# Patient Record
Sex: Female | Born: 1940 | Race: White | Hispanic: No | Marital: Married | Smoking: Former smoker
Health system: Southern US, Community
[De-identification: ages and names within clinical notes are randomized; demographics above are authoritative.]

## PROBLEM LIST (undated history)

## (undated) DIAGNOSIS — M069 Rheumatoid arthritis, unspecified: Secondary | ICD-10-CM

## (undated) DIAGNOSIS — E039 Hypothyroidism, unspecified: Secondary | ICD-10-CM

## (undated) DIAGNOSIS — I480 Paroxysmal atrial fibrillation: Secondary | ICD-10-CM

## (undated) DIAGNOSIS — H3530 Unspecified macular degeneration: Secondary | ICD-10-CM

## (undated) DIAGNOSIS — M810 Age-related osteoporosis without current pathological fracture: Secondary | ICD-10-CM

## (undated) DIAGNOSIS — M21619 Bunion of unspecified foot: Secondary | ICD-10-CM

## (undated) DIAGNOSIS — T7840XA Allergy, unspecified, initial encounter: Secondary | ICD-10-CM

## (undated) DIAGNOSIS — R7303 Prediabetes: Secondary | ICD-10-CM

## (undated) DIAGNOSIS — I10 Essential (primary) hypertension: Secondary | ICD-10-CM

## (undated) DIAGNOSIS — J329 Chronic sinusitis, unspecified: Secondary | ICD-10-CM

## (undated) DIAGNOSIS — Z79899 Other long term (current) drug therapy: Secondary | ICD-10-CM

## (undated) DIAGNOSIS — J449 Chronic obstructive pulmonary disease, unspecified: Secondary | ICD-10-CM

## (undated) DIAGNOSIS — M1990 Unspecified osteoarthritis, unspecified site: Secondary | ICD-10-CM

## (undated) DIAGNOSIS — G4700 Insomnia, unspecified: Secondary | ICD-10-CM

## (undated) DIAGNOSIS — E785 Hyperlipidemia, unspecified: Secondary | ICD-10-CM

## (undated) HISTORY — DX: Unspecified osteoarthritis, unspecified site: M19.90

## (undated) HISTORY — DX: Chronic sinusitis, unspecified: J32.9

## (undated) HISTORY — DX: Rheumatoid arthritis, unspecified: M06.9

## (undated) HISTORY — PX: APPENDECTOMY: SHX54

## (undated) HISTORY — DX: Unspecified macular degeneration: H35.30

## (undated) HISTORY — DX: Hypothyroidism, unspecified: E03.9

## (undated) HISTORY — DX: Paroxysmal atrial fibrillation: I48.0

## (undated) HISTORY — DX: Other long term (current) drug therapy: Z79.899

## (undated) HISTORY — DX: Bunion of unspecified foot: M21.619

## (undated) HISTORY — PX: LEG SURGERY: SHX1003

## (undated) HISTORY — DX: Essential (primary) hypertension: I10

## (undated) HISTORY — DX: Allergy, unspecified, initial encounter: T78.40XA

## (undated) HISTORY — DX: Insomnia, unspecified: G47.00

## (undated) HISTORY — DX: Age-related osteoporosis without current pathological fracture: M81.0

## (undated) HISTORY — DX: Hyperlipidemia, unspecified: E78.5

## (undated) HISTORY — DX: Prediabetes: R73.03

---

## 2016-12-17 LAB — HM MAMMOGRAPHY

## 2016-12-17 LAB — HM DEXA SCAN: HM DEXA SCAN: ABNORMAL

## 2017-07-19 ENCOUNTER — Encounter: Payer: Self-pay | Admitting: Nurse Practitioner

## 2017-07-19 ENCOUNTER — Ambulatory Visit (INDEPENDENT_AMBULATORY_CARE_PROVIDER_SITE_OTHER): Payer: Medicare Other | Admitting: Nurse Practitioner

## 2017-07-19 VITALS — BP 137/79 | HR 80 | Temp 98.1°F | Ht 61.5 in | Wt 102.0 lb

## 2017-07-19 DIAGNOSIS — M810 Age-related osteoporosis without current pathological fracture: Secondary | ICD-10-CM | POA: Diagnosis not present

## 2017-07-19 DIAGNOSIS — G4700 Insomnia, unspecified: Secondary | ICD-10-CM | POA: Diagnosis not present

## 2017-07-19 DIAGNOSIS — M792 Neuralgia and neuritis, unspecified: Secondary | ICD-10-CM | POA: Diagnosis not present

## 2017-07-19 DIAGNOSIS — E039 Hypothyroidism, unspecified: Secondary | ICD-10-CM | POA: Diagnosis not present

## 2017-07-19 DIAGNOSIS — M0579 Rheumatoid arthritis with rheumatoid factor of multiple sites without organ or systems involvement: Secondary | ICD-10-CM | POA: Diagnosis not present

## 2017-07-19 DIAGNOSIS — I480 Paroxysmal atrial fibrillation: Secondary | ICD-10-CM

## 2017-07-19 DIAGNOSIS — I10 Essential (primary) hypertension: Secondary | ICD-10-CM | POA: Diagnosis not present

## 2017-07-19 DIAGNOSIS — G8929 Other chronic pain: Secondary | ICD-10-CM

## 2017-07-19 DIAGNOSIS — Z7689 Persons encountering health services in other specified circumstances: Secondary | ICD-10-CM

## 2017-07-19 MED ORDER — HYDROCODONE-ACETAMINOPHEN 2.5-325 MG PO TABS: ORAL_TABLET | ORAL | 0 refills | Status: DC

## 2017-07-19 MED ORDER — LEVOTHYROXINE SODIUM 25 MCG PO TABS: 25.0000 ug | ORAL_TABLET | Freq: Every day | ORAL | 0 refills | Status: DC

## 2017-07-19 MED ORDER — HYDROCODONE-ACETAMINOPHEN 5-325 MG PO TABS: ORAL_TABLET | ORAL | 0 refills | Status: DC

## 2017-07-19 MED ORDER — GABAPENTIN 100 MG PO CAPS: ORAL_CAPSULE | ORAL | 1 refills | Status: DC

## 2017-07-19 NOTE — Patient Instructions (Addendum)
Mrs. Losurdo, Thank you for coming in to clinic today.  1. For your thyroid: - take levothyroxine 25 mcg once daily  2. For your Rheumatoid arthritis: - I will make a referral for when you need to see them again for your medications.  Call clinic to let us know.  3. For your shooting pain (neuropathic pain): - Take gabapentin 100 mg one tablet twice daily as needed. Continue taking 300 mg or 3 tablets at bedtime.  4. For your hydrocodone: - Finish your current bottle w/ one tablet twice daily. - Your new pills will be hydrocodone - 2.5mg  325 acetaminophen. - Take 2 tablets twice daily for 7 days.  Then, take 1 tablet twice daily for 7 days,  Then take 1 tablet once daily for 7 days and STOP.  Please schedule a follow-up appointment with Cassell Smiles, AGNP. Return in about 2 months (around 09/18/2017) for chronic disease review with labs 2-3 days prior.  If you have any other questions or concerns, please feel free to call the clinic or send a message through [place]. You may also schedule an earlier appointment if necessary.  You will receive a survey after today's visit either digitally by e-mail or paper by [employer]. Your experiences and feedback matter to [place].  Please respond so we know how we are doing as we provide care for you.   Cassell Smiles, DNP, AGNP-BC Adult Gerontology Nurse Practitioner [place]

## 2017-07-19 NOTE — Progress Notes (Signed)
Subjective:    Patient ID: Alexis Owens, female    DOB: 1941/07/22, 76 y.o.   MRN: [id number]  Alexis Owens is a 76 y.o. female presenting on 07/19/2017 for [place] Provider Pt last seen by PCP 2 months ago.  Obtain records from Dr. Liam Rogers, [facility], [place], [place].   Rheumatologist prior was Dr. Tori Milks She has no acute concerns today, but wants to reestablish care after her move.  Recently moved from [facility] to [facility] w/ her husband to be closer to children/grandchildren.  Otherwise has no acute concerns today.  Feels stable on medications.  Still lives very active lifestyle.  Problem List Review: Rheumatoid Arthritis: next appointment w/ Rheumatology is due next in January 2019.   She is concerned she may not have refills until then, so encouraged pt to call prior rheumatology or have prescriptions transferred from her pharmacy.   Osteoporosis: managed by Rheumatology.  Currently on alendronate.  Has had prior compound fx of R tibia.  None in last year.  No falls in last 1 year.   Hypothyroidism: currently taking 25 mcg levothyroxine.  Consider not replacing.   Paroxysmal atrial fibrillation/Hypertension: currently taking lisinopril 20 mg once daily, diltiazem 24 hr tab 240 mg once daily.  Pt notes good bp and HR control.  Checks at home and usually less than 130/80. Currently asymptomatic.  No recent palpitations or heart racing.   Chronic pain: has been regularly on hydrocodone acetaminophen 7.5-325 mg tablet tid.  Has self weaned to bid.  Knows she will not get chronic opioids from this clinic and would like to try to stop them completely.  Pt has been on opioids for > 2 years.  - She also takes gabapentin 300 mg at bedtime. This provides relief w/ nightly dosing, but still has difficulty w/ pain during day.  Would consider increase of gabapentin for pain control prior to going to pain clinic.   GERD:  omeprazole 20 mg once daily.  NO active symptoms.  Taking as preventative w/ alendronate.   Seasonal allergies: managed w/ fonase, nasalcrom.  Takes OTC antihistamine as needed.  Currently asymptomatic.   Insomnia: trazodone 50 mg at hs for assistance w/ onset of sleep.   Past Medical History:  Diagnosis Date  . Allergy   . Arthritis   . Bunion   . Chronic sinusitis   . High risk medication use   . Hyperlipidemia   . Hypertension   . Hypothyroidism   . Insomnia   . Macular degeneration   . Osteoporosis   . Paroxysmal A-fib ([place])   . Prediabetes   . Rheumatoid arthritis [facility])    Past Surgical History:  Procedure Laterality Date  . APPENDECTOMY    . LEG SURGERY     Rt leg surgery for Compound fracture of the Right tibia    Social History   Social History  . Marital status: Married    Spouse name: N/A  . Number of children: N/A  . Years of education: N/A   Occupational History  . Not on file.   Social History Main Topics  . Smoking status: Former Smoker    Quit date: 07/19/2014  . Smokeless tobacco: Never Used  . Alcohol use Yes  . Drug use: No  . Sexual activity: Not on file   Other Topics Concern  . Not on file   Social History Narrative  . No narrative on file   Family  History  Problem Relation Age of Onset  . Breast cancer Sister   . Heart disease Brother   . Diabetes Brother    No current outpatient prescriptions on file prior to visit.   No current facility-administered medications on file prior to visit.     Review of Systems  Constitutional: Negative.   HENT: Negative.   Eyes: Negative.   Respiratory: Negative.   Cardiovascular: Negative.   Gastrointestinal: Negative.   Endocrine: Negative.   Genitourinary: Negative.   Musculoskeletal: Positive for arthralgias, back pain and joint swelling.  Skin: Negative.   Allergic/Immunologic: Negative.   Neurological: Negative.   Hematological: Negative.   Psychiatric/Behavioral: Negative.     Per HPI unless specifically indicated above      Objective:    BP 137/79 (BP Location: Right Arm, Patient Position: Sitting, Cuff Size: Small)   Pulse 80   Temp 98.1 F (36.7 C) (Oral)   Ht 5' 1.5" (1.562 m)   Wt 102 lb (46.3 kg)   BMI 18.96 kg/m   Wt Readings from Last 3 Encounters:  07/19/17 102 lb (46.3 kg)    Physical Exam  General - thin, well-appearing, NAD HEENT - Normocephalic, atraumatic Neck - supple, non-tender, no LAD, no thyromegaly, no carotid bruit Heart - RRR, no murmurs heard Lungs - Clear throughout all lobes, no wheezing, crackles, or rhonchi. Normal work of breathing. Extremeties - non-tender, no edema, cap refill < 2 seconds, peripheral pulses intact +2 bilaterally Skin - warm, dry Neuro - awake, alert, oriented x3, normal gait Psych - Normal mood and affect, normal behavior    Results for orders placed or performed in visit on 07/19/17  HM MAMMOGRAPHY  Result Value Ref Range   HM Mammogram 0-4 Bi-Rad 0-4 Bi-Rad, Self Reported Normal  HM DEXA SCAN  Result Value Ref Range   HM Dexa Scan abnormal       Assessment & Plan:   Problem List Items Addressed This Visit      Cardiovascular and Mediastinum   Hypertension    Currently well controlled on medications.  Pt w/o current symptoms.  Checks BP at home and is usually at goal of 130/80.  Plan: 1. Continue lisinopril and diltiazem. 2. Follow up 2 months.      Relevant Medications   diltiazem (TIAZAC) 240 MG 24 hr capsule   lisinopril (PRINIVIL,ZESTRIL) 20 MG tablet   Paroxysmal A-fib ([facility])    Controlled.  Regular rate.  HR less than 100.  Pt w/o symptoms and on stable dose for several years.  Plan: 1. Continue diltiazem.   2. Consult Cardiology if any changes. 3. Follow up 2 months.      Relevant Medications   diltiazem (TIAZAC) 240 MG 24 hr capsule   lisinopril (PRINIVIL,ZESTRIL) 20 MG tablet     Endocrine   Hypothyroidism    Currently well controlled.  Last labs w/ TSH at goal.   Caution over replacement r/t presence of osteoporosis.    Plan: 1. Continue levothyroxine 25 mcg once daily. 2. Recheck TSH at next labs in 2 months. 3. Follow up 2 months.      Relevant Medications   levothyroxine (SYNTHROID, LEVOTHROID) 25 MCG tablet     Musculoskeletal and Integument   Rheumatoid arthritis ([place])    Currently managed by Rheumatology.  Mild to moderate symptoms currently w/ predominant symptom of joint pain.  No limitation of activities.  Plan: 1. Continue methotrexate and hydroxychloroquine per Rheumatology. Referral placed today for [facility].  Continue  w/ followup to maintain medications continuously.  Refills will not be provided through PCP.  Pt aware and verbalizes understanding.      Relevant Medications   hydroxychloroquine (PLAQUENIL) 200 MG tablet   methotrexate (RHEUMATREX) 2.5 MG tablet   Hydrocodone-Acetaminophen 2.5-325 MG TABS   Osteoporosis    Stable L spine, decrease in BMD of 3% in femoral neck over 3 years.  Last DEXA scan 12/2016.  Pt on therapy w/ alendronate once weekly per Rheumatology in past.  Plan: 1. Continue care with Rheumatology since also seeing for RA.  If needed, can manage alendronate in PCP clinic. 2. Continue Calcium and vitaminD supplement tid.  Continue alendronate once weekly. 3. Follow up w/ BMD in about 2 years unless requested sooner by Rheumatology.      Relevant Medications   alendronate (FOSAMAX) 70 MG tablet     Other   Insomnia    Currently well managed per pt's brief review.  Takes Trazodone 50 mg nightly.    Plan: 1. continue medication.   2. Encourage good sleep hygiene.      Neuropathic pain - Primary    Pt notes shooting pain in her back.  Pt previously taking Hydrocodone for other chronic pain, but notes it does not relieve these symptoms.  Gabapentin previously only taken at night, but not providing full day relief.  Plan: 1. START Gabapentin 100mg  capsules.  Take 1 capsule only  at night for 2-3 nights.  Then, increase one tablet every few days to max of 1 capsule 3 times a day.  Cautioned drowsiness. - May also take 3 capsules at night instead to avoid daytime sleepiness. 2. Follow up 2 months.      Relevant Medications   gabapentin (NEURONTIN) 100 MG capsule   Other chronic pain    Pt w/ chronic pain.  Currently manageable.  Chronic Opioid use w/ pt desiring to stop her opioid medication.  Has tapered from tid dosing to bid dosing herself.  Concern for opioid dependence and withdrawal symptoms if taper not guided and extended beyond current pill supply of about 6 more days.  Plan: 1. Start guided hydrocodone-acetaminophen taper using 2.5 mg tablets. - Cautioned pt that she will have more tylenol w/ multiple pills.  Avoid more than 3,000 mg in 24 hours. - Take 2 tablets twice daily for 7 days.  Then, take 1 tablet twice daily for 7 days,  Then take 1 tablet once daily for 7 days and STOP.  Pt may need extension of taper if experiencing side effects.   2. If inadequate pain control off opioids, should visit pain control clinic for additional management.       Relevant Medications   traZODone (DESYREL) 50 MG tablet   gabapentin (NEURONTIN) 100 MG capsule   Hydrocodone-Acetaminophen 2.5-325 MG TABS    Other Visit Diagnoses    Encounter to establish care     Previous PCP was in [place], [place].  Past medical, family, and surgical history reviewed.       Meds ordered this encounter  Medications  . alendronate (FOSAMAX) 70 MG tablet    Sig: Take 70 mg by mouth once a week. Take with a full glass of water on an empty stomach.  . diltiazem (TIAZAC) 240 MG 24 hr capsule    Sig: Take 240 mg by mouth daily.  . fluticasone (FLONASE) 50 MCG/ACT nasal spray    Sig: Place 2 sprays into both nostrils daily.  . folic acid (FOLVITE) 1 MG  tablet    Sig: Take 1 mg by mouth daily.  Marland Kitchen DISCONTD: gabapentin (NEURONTIN) 300 MG capsule    Sig: Take 300 mg by mouth at bedtime.    Marland Kitchen DISCONTD: HYDROcodone-acetaminophen (NORCO) 7.5-325 MG tablet    Sig: Take 1 tablet by mouth every 8 (eight) hours as needed for moderate pain.  . hydroxychloroquine (PLAQUENIL) 200 MG tablet    Sig: Take 200 mg by mouth every morning.  Marland Kitchen DISCONTD: levothyroxine (SYNTHROID, LEVOTHROID) 25 MCG tablet    Sig: Take 25 mcg by mouth daily before breakfast.  . lisinopril (PRINIVIL,ZESTRIL) 20 MG tablet    Sig: Take 20 mg by mouth daily.  . methotrexate (RHEUMATREX) 2.5 MG tablet    Sig: Take 2.5 mg by mouth once a week. Caution:Chemotherapy. Protect from light.  . cromolyn (NASALCROM) 5.2 MG/ACT nasal spray    Sig: Place 1 spray into both nostrils as needed for allergies.  Marland Kitchen omeprazole (PRILOSEC) 20 MG capsule    Sig: Take 20 mg by mouth daily.  Marland Kitchen albuterol (PROVENTIL HFA;VENTOLIN HFA) 108 (90 Base) MCG/ACT inhaler    Sig: Inhale 1-2 puffs into the lungs every 6 (six) hours as needed for wheezing or shortness of breath.  . traZODone (DESYREL) 50 MG tablet    Sig: Take 50 mg by mouth at bedtime.  Marland Kitchen DISCONTD: HYDROcodone-acetaminophen (NORCO/VICODIN) 5-325 MG tablet    Sig: Take one tablet every 12 hours for 1 week.  Take 1/2 tablet every 12 hours for 1 week.  Take 1/2 tablet once daily for 2 weeks then stop.    Dispense:  28 tablet    Refill:  0  . gabapentin (NEURONTIN) 100 MG capsule    Sig: Take 1 tablet (100mg ) twice daily as needed then take 3 tablets (300mg ) at bedtime.    Dispense:  450 capsule    Refill:  1  . levothyroxine (SYNTHROID, LEVOTHROID) 25 MCG tablet    Sig: Take 1 tablet (25 mcg total) by mouth daily before breakfast.    Dispense:  90 tablet    Refill:  0  . Hydrocodone-Acetaminophen 2.5-325 MG TABS    Sig: Take 2 tablet twice daily for 7 days.  Then, take 1 tablet twice daily for 7 days,  Then take 1 tablet once daily for 7 days and STOP.    Dispense:  49 tablet    Refill:  0     Follow up plan: Return in about 2 months (around 09/18/2017) for chronic disease  review with labs 2-3 days prior.  Cassell Smiles, DNP, AGPCNP-BC Adult Gerontology Primary Care Nurse Practitioner [facility] 07/24/2017, 1:10 PM

## 2017-07-24 DIAGNOSIS — Z79899 Other long term (current) drug therapy: Secondary | ICD-10-CM | POA: Insufficient documentation

## 2017-07-24 DIAGNOSIS — M792 Neuralgia and neuritis, unspecified: Secondary | ICD-10-CM | POA: Insufficient documentation

## 2017-07-24 DIAGNOSIS — E785 Hyperlipidemia, unspecified: Secondary | ICD-10-CM | POA: Insufficient documentation

## 2017-07-24 DIAGNOSIS — M059 Rheumatoid arthritis with rheumatoid factor, unspecified: Secondary | ICD-10-CM | POA: Insufficient documentation

## 2017-07-24 DIAGNOSIS — G4700 Insomnia, unspecified: Secondary | ICD-10-CM | POA: Insufficient documentation

## 2017-07-24 DIAGNOSIS — J309 Allergic rhinitis, unspecified: Secondary | ICD-10-CM | POA: Insufficient documentation

## 2017-07-24 DIAGNOSIS — H3530 Unspecified macular degeneration: Secondary | ICD-10-CM | POA: Insufficient documentation

## 2017-07-24 DIAGNOSIS — M80052S Age-related osteoporosis with current pathological fracture, left femur, sequela: Secondary | ICD-10-CM | POA: Insufficient documentation

## 2017-07-24 DIAGNOSIS — I480 Paroxysmal atrial fibrillation: Secondary | ICD-10-CM | POA: Insufficient documentation

## 2017-07-24 DIAGNOSIS — M069 Rheumatoid arthritis, unspecified: Secondary | ICD-10-CM | POA: Insufficient documentation

## 2017-07-24 DIAGNOSIS — E039 Hypothyroidism, unspecified: Secondary | ICD-10-CM | POA: Insufficient documentation

## 2017-07-24 DIAGNOSIS — I10 Essential (primary) hypertension: Secondary | ICD-10-CM | POA: Insufficient documentation

## 2017-07-24 DIAGNOSIS — G8929 Other chronic pain: Secondary | ICD-10-CM | POA: Insufficient documentation

## 2017-07-24 DIAGNOSIS — R7303 Prediabetes: Secondary | ICD-10-CM | POA: Insufficient documentation

## 2017-07-24 NOTE — Assessment & Plan Note (Signed)
Pt w/ chronic pain.  Currently manageable.  Chronic Opioid use w/ pt desiring to stop her opioid medication.  Has tapered from tid dosing to bid dosing herself.  Concern for opioid dependence and withdrawal symptoms if taper not guided and extended beyond current pill supply of about 6 more days.  Plan: 1. Start guided hydrocodone-acetaminophen taper using 2.5 mg tablets. - Cautioned pt that she will have more tylenol w/ multiple pills.  Avoid more than 3,000 mg in 24 hours. - Take 2 tablets twice daily for 7 days.  Then, take 1 tablet twice daily for 7 days,  Then take 1 tablet once daily for 7 days and STOP.  Pt may need extension of taper if experiencing side effects.   2. If inadequate pain control off opioids, should visit pain control clinic for additional management.

## 2017-07-24 NOTE — Assessment & Plan Note (Signed)
Currently well controlled on medications.  Pt w/o current symptoms.  Checks BP at home and is usually at goal of 130/80.  Plan: 1. Continue lisinopril and diltiazem. 2. Follow up 2 months.

## 2017-07-24 NOTE — Assessment & Plan Note (Signed)
Currently well controlled.  Last labs w/ TSH at goal.  Caution over replacement r/t presence of osteoporosis.    Plan: 1. Continue levothyroxine 25 mcg once daily. 2. Recheck TSH at next labs in 2 months. 3. Follow up 2 months.

## 2017-07-24 NOTE — Assessment & Plan Note (Signed)
Currently managed by Rheumatology.  Mild to moderate symptoms currently w/ predominant symptom of joint pain.  No limitation of activities.  Plan: 1. Continue methotrexate and hydroxychloroquine per Rheumatology. Referral placed today for [facility].  Continue w/ followup to maintain medications continuously.  Refills will not be provided through PCP.  Pt aware and verbalizes understanding.

## 2017-07-24 NOTE — Assessment & Plan Note (Signed)
Controlled.  Regular rate.  HR less than 100.  Pt w/o symptoms and on stable dose for several years.  Plan: 1. Continue diltiazem.   2. Consult Cardiology if any changes. 3. Follow up 2 months.

## 2017-07-24 NOTE — Assessment & Plan Note (Signed)
Currently well managed per pt's brief review.  Takes Trazodone 50 mg nightly.    Plan: 1. continue medication.   2. Encourage good sleep hygiene.

## 2017-07-24 NOTE — Assessment & Plan Note (Addendum)
Stable L spine, decrease in BMD of 3% in femoral neck over 3 years.  Last DEXA scan 12/2016.  Pt on therapy w/ alendronate once weekly per Rheumatology in past.  Plan: 1. Continue care with Rheumatology since also seeing for RA.  If needed, can manage alendronate in PCP clinic. 2. Continue Calcium and vitaminD supplement tid.  Continue alendronate once weekly. 3. Follow up w/ BMD in about 2 years unless requested sooner by Rheumatology.

## 2017-07-24 NOTE — Assessment & Plan Note (Addendum)
Pt notes shooting pain in her back.  Pt previously taking Hydrocodone for other chronic pain, but notes it does not relieve these symptoms.  Gabapentin previously only taken at night, but not providing full day relief.  Plan: 1. START Gabapentin 100mg  capsules.  Take 1 capsule only at night for 2-3 nights.  Then, increase one tablet every few days to max of 1 capsule 3 times a day.  Cautioned drowsiness. - May also take 3 capsules at night instead to avoid daytime sleepiness. 2. Follow up 2 months.

## 2017-08-01 ENCOUNTER — Ambulatory Visit: Payer: Self-pay | Admitting: Family Medicine

## 2017-08-13 ENCOUNTER — Other Ambulatory Visit: Payer: Self-pay

## 2017-08-13 DIAGNOSIS — M810 Age-related osteoporosis without current pathological fracture: Secondary | ICD-10-CM

## 2017-08-13 MED ORDER — ALENDRONATE SODIUM 70 MG PO TABS: 70.0000 mg | ORAL_TABLET | ORAL | 2 refills | Status: DC

## 2017-09-02 ENCOUNTER — Encounter: Payer: Self-pay | Admitting: Nurse Practitioner

## 2017-09-02 DIAGNOSIS — M810 Age-related osteoporosis without current pathological fracture: Secondary | ICD-10-CM

## 2017-09-02 DIAGNOSIS — M0579 Rheumatoid arthritis with rheumatoid factor of multiple sites without organ or systems involvement: Secondary | ICD-10-CM

## 2017-09-09 NOTE — Addendum Note (Signed)
Addended by: Cleaster Corin on: 09/09/2017 09:15 AM   Modules accepted: Orders

## 2017-09-19 ENCOUNTER — Encounter: Payer: Self-pay | Admitting: Nurse Practitioner

## 2017-09-19 DIAGNOSIS — Z79899 Other long term (current) drug therapy: Secondary | ICD-10-CM

## 2017-09-19 DIAGNOSIS — E039 Hypothyroidism, unspecified: Secondary | ICD-10-CM

## 2017-09-19 DIAGNOSIS — E782 Mixed hyperlipidemia: Secondary | ICD-10-CM

## 2017-09-24 LAB — LIPID PANEL
Cholesterol: 186 mg/dL (ref <200)
HDL: 93 mg/dL (ref >50)
LDL Cholesterol (Calc): 77 mg/dL (calc)
Non-HDL Cholesterol (Calc): 93 mg/dL (calc) (ref <130)
Total CHOL/HDL Ratio: 2 (calc) (ref <5.0)
Triglycerides: 75 mg/dL (ref <150)

## 2017-09-24 LAB — COMPREHENSIVE METABOLIC PANEL
AG Ratio: 1.8 (calc) (ref 1.0–2.5)
ALT: 18 U/L (ref 6–29)
AST: 30 U/L (ref 10–35)
Albumin: 4.3 g/dL (ref 3.6–5.1)
Alkaline phosphatase (APISO): 46 U/L (ref 33–130)
BUN: 19 mg/dL (ref 7–25)
CO2: 27 mmol/L (ref 20–32)
Calcium: 9.2 mg/dL (ref 8.6–10.4)
Chloride: 103 mmol/L (ref 98–110)
Creat: 0.77 mg/dL (ref 0.60–0.93)
Globulin: 2.4 g/dL (calc) (ref 1.9–3.7)
Glucose, Bld: 105 mg/dL — ABNORMAL HIGH (ref 65–99)
Potassium: 4.1 mmol/L (ref 3.5–5.3)
Sodium: 137 mmol/L (ref 135–146)
Total Bilirubin: 0.5 mg/dL (ref 0.2–1.2)
Total Protein: 6.7 g/dL (ref 6.1–8.1)

## 2017-09-24 LAB — TSH: TSH: 1.76 mIU/L (ref 0.40–4.50)

## 2017-10-14 ENCOUNTER — Encounter: Payer: Self-pay | Admitting: Nurse Practitioner

## 2017-10-14 ENCOUNTER — Other Ambulatory Visit: Payer: Self-pay

## 2017-10-14 ENCOUNTER — Ambulatory Visit (INDEPENDENT_AMBULATORY_CARE_PROVIDER_SITE_OTHER): Payer: Medicare Other | Admitting: Nurse Practitioner

## 2017-10-14 VITALS — BP 165/76 | HR 70 | Temp 97.4°F | Ht 61.5 in | Wt 108.2 lb

## 2017-10-14 DIAGNOSIS — M810 Age-related osteoporosis without current pathological fracture: Secondary | ICD-10-CM | POA: Diagnosis not present

## 2017-10-14 DIAGNOSIS — I480 Paroxysmal atrial fibrillation: Secondary | ICD-10-CM

## 2017-10-14 DIAGNOSIS — I10 Essential (primary) hypertension: Secondary | ICD-10-CM

## 2017-10-14 DIAGNOSIS — G4709 Other insomnia: Secondary | ICD-10-CM | POA: Diagnosis not present

## 2017-10-14 DIAGNOSIS — Z79899 Other long term (current) drug therapy: Secondary | ICD-10-CM | POA: Insufficient documentation

## 2017-10-14 DIAGNOSIS — M159 Polyosteoarthritis, unspecified: Secondary | ICD-10-CM | POA: Insufficient documentation

## 2017-10-14 DIAGNOSIS — E039 Hypothyroidism, unspecified: Secondary | ICD-10-CM | POA: Diagnosis not present

## 2017-10-14 MED ORDER — DILTIAZEM HCL ER 180 MG PO CP24: 180.0000 mg | ORAL_CAPSULE | Freq: Every day | ORAL | 1 refills | Status: DC

## 2017-10-14 MED ORDER — LEVOTHYROXINE SODIUM 25 MCG PO TABS: 25.0000 ug | ORAL_TABLET | Freq: Every day | ORAL | 0 refills | Status: DC

## 2017-10-14 NOTE — Progress Notes (Signed)
Subjective:    Patient ID: Alexis Owens, female    DOB: [birth year], 76 y.o.   MRN: [id number]  Alexis Owens is a 76 y.o. female presenting on 10/14/2017 for Arthritis (pt just saw the Rhematologist )   HPI Osteoporosis DEXA was last performed February 2018.  Patient started alendronate at that time.  She will be due for repeat DEXA in February.  Patient is tolerating alendronate well without significantly increased acid reflux.  She is taking omeprazole 20 mg once daily.  She reports no new fractures.  She engages in weightbearing activity 2-3 days a week.  She has had no falls in the last 6 months.  Atrial fib Patient reports intermittent use of diltiazem only when she feels her heart racing and fluttering.  She does not want to take her current dose because she feels it makes her dizzy and lightheaded.  She is also treated for hypertension with lisinopril 20 mg once daily and takes that daily.  She is not on anticoagulation does not desire to start anticoagulation.  She verbalizes her risk of stroke, but does not want to be on chronic anticoagulation for risk of bleeding.   Insomnia Patient reports insomnia with difficulty falling asleep and staying asleep at night.  She has used trazodone 50 mg tablet at bedtime for the last several years and reports significant improvement in her ability to stay asleep.  She desires to continue needs a refill today.  She reports no daytime drowsiness, dizziness, weakness.  Hypothyroidism - Pt states she is taking her levothyroxine 25 mcg in the am at least 1 hour before eating or drinking and taking other medicines.  She initially started this medication for hair loss, fatigue and noted improvement after starting.  She would like to continue this medication and is currently at a safe dose per recent lab collection. TSH  Date Value Ref Range Status  09/24/2017 1.76 0.40 - 4.50 mIU/L Final   - She is not currently symptomatic. - She denies, fatigue,  excess energy, weight changes, heart racing, heart palpitations, heat and cold intolerance, changes in hair/skin/nails, and lower leg swelling.  - She does not have any compressive symptoms to include difficulty swallowing, globus sensation, or difficulty breathing when lying flat.   Social History   Tobacco Use  . Smoking status: Former Smoker    Last attempt to quit: 07/19/2014    Years since quitting: 3.2  . Smokeless tobacco: Never Used  Substance Use Topics  . Alcohol use: Yes  . Drug use: No    Review of Systems Per HPI unless specifically indicated above     Objective:    BP (!) 165/76 (BP Location: Right Arm, Patient Position: Sitting, Cuff Size: Small)   Pulse 70   Temp (!) 97.4 F (36.3 C) (Oral)   Ht 5' 1.5" (1.562 m)   Wt 108 lb 3.2 oz (49.1 kg)   BMI 20.11 kg/m      Wt Readings from Last 3 Encounters:  10/14/17 108 lb 3.2 oz (49.1 kg)  07/19/17 102 lb (46.3 kg)    Physical Exam  General - healthy, well-appearing, NAD HEENT - Normocephalic, atraumatic Neck - supple, non-tender, no LAD, no thyromegaly, no carotid bruit Heart - RRR, no murmurs heard Lungs - Clear throughout all lobes, no wheezing, crackles, or rhonchi. Normal work of breathing. Extremeties - non-tender, no edema, cap refill < 2 seconds, peripheral pulses intact +2 bilaterally Skin - warm, dry Neuro - awake, alert, oriented x3, normal  gait Psych - Normal mood and affect, normal behavior    Results for orders placed or performed in visit on 09/19/17  TSH  Result Value Ref Range   TSH 1.76 0.40 - 4.50 mIU/L  Lipid panel  Result Value Ref Range   Cholesterol 186 <200 mg/dL   HDL 93 >50 mg/dL   Triglycerides 75 <150 mg/dL   LDL Cholesterol (Calc) 77 mg/dL (calc)   Total CHOL/HDL Ratio 2.0 <5.0 (calc)   Non-HDL Cholesterol (Calc) 93 <130 mg/dL (calc)  Comprehensive metabolic panel  Result Value Ref Range   Glucose, Bld 105 (H) 65 - 99 mg/dL   BUN 19 7 - 25 mg/dL   Creat 0.77 0.60 -  0.93 mg/dL   BUN/Creatinine Ratio NOT APPLICABLE 6 - 22 (calc)   Sodium 137 135 - 146 mmol/L   Potassium 4.1 3.5 - 5.3 mmol/L   Chloride 103 98 - 110 mmol/L   CO2 27 20 - 32 mmol/L   Calcium 9.2 8.6 - 10.4 mg/dL   Total Protein 6.7 6.1 - 8.1 g/dL   Albumin 4.3 3.6 - 5.1 g/dL   Globulin 2.4 1.9 - 3.7 g/dL (calc)   AG Ratio 1.8 1.0 - 2.5 (calc)   Total Bilirubin 0.5 0.2 - 1.2 mg/dL   Alkaline phosphatase (APISO) 46 33 - 130 U/L   AST 30 10 - 35 U/L   ALT 18 6 - 29 U/L      Assessment & Plan:   Problem List Items Addressed This Visit      Cardiovascular and Mediastinum   Hypertension - Primary    Currently well controlled on medications.  Pt w/o current symptoms, but reports occasional dizziness.  Pt not taking diltiazem daily related to this dizziness.  Checks BP at home and is usually at goal of 130/80.  W/ dizziness and advanced age, will increase goal BP to less than 140/90  Plan: 1. Continue lisinopril . - DECREASE diltiazem 24 hr capsule from 240 mg once daily to 180 mg once daily.  Encouraged pt to take this daily. 2. Pt declines anticoagulation.  Is not taking aspirin.  Encouraged pt to start aspirin 81 mg once daily. 3. Follow up 6 months.      Relevant Medications   diltiazem (DILACOR XR) 180 MG 24 hr capsule   Paroxysmal A-fib (HCC)    Controlled.  Regular rate.  HR less than 100.  Pt w/o symptoms and on stable dose for several years, however does not take daily r/t dizziness.  Plan: 1. Reduce diltiazem as above. 2. Consult Cardiology if any changes. 3. Follow up 3 months.      Relevant Medications   diltiazem (DILACOR XR) 180 MG 24 hr capsule     Endocrine   Hypothyroidism    Currently well controlled.  Pt reports she has had improved symptoms after starting levothyroxine and desires to continue.  TSH well within normal limits.  Plan: 1. Continue levothyroxine 25 mcg once daily. 2. Recheck TSH at next labs in 6 months. 3. Follow up 6 months.       Relevant Medications   levothyroxine (SYNTHROID, LEVOTHROID) 25 MCG tablet     Musculoskeletal and Integument   Osteoporosis    Stable L spine, decrease in BMD of 3% in femoral neck over 3 years.  Last DEXA scan 12/2016.  Pt on therapy w/ alendronate once weekly per Rheumatology in past. Pt will be due for repeat DEXA on 12/2017 for 1 yr post therapy scan.  Plan: 1. DEXA scan ordered for 12/2017.  Pt instructed to call and schedule.  Verbalizes understanding. 2. Continue Calcium and vitaminD supplement tid.   - Continue alendronate once weekly. - Continue PPI prophylaxis. 3. Followup 6 months or sooner depending on DEXA scan results.      Relevant Orders   DG Bone Density     Other   Insomnia    Currently well managed on Trazodone 50 mg nightly.  Pt reports no adverse effects of trazodone and has no increased risk for falls while on this medication.  Plan: 1. Continue medication.  Consider drug holidays of 5-7 days once every 3-6 months.  Pt verbalizes understanding and states she does this regularly. 2. Encourage good sleep hygiene. 3. Followup 1 year.         Meds ordered this encounter  Medications  . diltiazem (DILACOR XR) 180 MG 24 hr capsule    Sig: Take 1 capsule (180 mg total) by mouth daily.    Dispense:  90 capsule    Refill:  1    Order Specific Question:   Supervising Provider    Answer:   Olin Hauser [[id number]]  . levothyroxine (SYNTHROID, LEVOTHROID) 25 MCG tablet    Sig: Take 1 tablet (25 mcg total) by mouth daily before breakfast.    Dispense:  90 tablet    Refill:  0    Order Specific Question:   Supervising Provider    Answer:   Olin Hauser [[id number]]    Follow up plan: Return in about 6 months (around 04/14/2018) for Thyroid, hypertension.  A total of 40 minutes was spent face-to-face with this patient. Greater than 50% of this time was spent in counseling and coordination of care with the patient.    Cassell Smiles, DNP,  AGPCNP-BC Adult Gerontology Primary Care Nurse Practitioner [facility] 10/18/2017, 5:13 PM

## 2017-10-14 NOTE — Patient Instructions (Addendum)
Hani, Thank you for coming in to clinic today.  1. CHANGE diltiazem ER to 180 mg once daily. - CONTINUE checking your blood pressure outside of the clinic.   - GOAL is less than 130/80 - Send your readings to the clinic to make sure we have it controlled well enough with your dose change.   2. Continue all other medications without changes.  3. You will be due for your DEXA scan 1 year after alendronate. Your DEXA order has been placed.  Call the Scheduling phone number at [phone] to schedule your DEXA at your convenience.  You can choose to go to either location listed below.  Let the scheduler know which location you prefer.  [facility] [address], [place] [address]                          [place], [place] [address]        Please schedule a follow-up appointment with Cassell Smiles, AGNP. Return in about 6 months (around 04/14/2018) for Thyroid, hypertension.  If you have any other questions or concerns, please feel free to call the clinic or send a message through [place]. You may also schedule an earlier appointment if necessary.  You will receive a survey after today's visit either digitally by e-mail or paper by [employer]. Your experiences and feedback matter to [place].  Please respond so we know how we are doing as we provide care for you.   Cassell Smiles, DNP, AGNP-BC Adult Gerontology Nurse Practitioner [place]

## 2017-10-17 NOTE — Assessment & Plan Note (Addendum)
Currently well controlled on medications.  Pt w/o current symptoms, but reports occasional dizziness.  Pt not taking diltiazem daily related to this dizziness.  Checks BP at home and is usually at goal of 130/80.  W/ dizziness and advanced age, will increase goal BP to less than 140/90  Plan: 1. Continue lisinopril . - DECREASE diltiazem 24 hr capsule from 240 mg once daily to 180 mg once daily.  Encouraged pt to take this daily. 2. Pt declines anticoagulation.  Is not taking aspirin.  Encouraged pt to start aspirin 81 mg once daily. 3. Follow up 6 months.

## 2017-10-18 NOTE — Assessment & Plan Note (Signed)
Currently well managed on Trazodone 50 mg nightly.  Pt reports no adverse effects of trazodone and has no increased risk for falls while on this medication.  Plan: 1. Continue medication.  Consider drug holidays of 5-7 days once every 3-6 months.  Pt verbalizes understanding and states she does this regularly. 2. Encourage good sleep hygiene. 3. Followup 1 year.

## 2017-10-18 NOTE — Assessment & Plan Note (Signed)
Currently well controlled.  Pt reports she has had improved symptoms after starting levothyroxine and desires to continue.  TSH well within normal limits.  Plan: 1. Continue levothyroxine 25 mcg once daily. 2. Recheck TSH at next labs in 6 months. 3. Follow up 6 months.

## 2017-10-18 NOTE — Assessment & Plan Note (Signed)
Controlled.  Regular rate.  HR less than 100.  Pt w/o symptoms and on stable dose for several years, however does not take daily r/t dizziness.  Plan: 1. Reduce diltiazem as above. 2. Consult Cardiology if any changes. 3. Follow up 3 months.

## 2017-10-18 NOTE — Assessment & Plan Note (Signed)
Stable L spine, decrease in BMD of 3% in femoral neck over 3 years.  Last DEXA scan 12/2016.  Pt on therapy w/ alendronate once weekly per Rheumatology in past. Pt will be due for repeat DEXA on 12/2017 for 1 yr post therapy scan.  Plan: 1. DEXA scan ordered for 12/2017.  Pt instructed to call and schedule.  Verbalizes understanding. 2. Continue Calcium and vitaminD supplement tid.   - Continue alendronate once weekly. - Continue PPI prophylaxis. 3. Followup 6 months or sooner depending on DEXA scan results.

## 2017-11-11 ENCOUNTER — Other Ambulatory Visit: Payer: Self-pay | Admitting: Nurse Practitioner

## 2017-11-11 DIAGNOSIS — M810 Age-related osteoporosis without current pathological fracture: Secondary | ICD-10-CM

## 2017-12-18 ENCOUNTER — Inpatient Hospital Stay: Payer: Medicare Other | Admitting: Anesthesiology

## 2017-12-18 ENCOUNTER — Other Ambulatory Visit: Payer: Self-pay

## 2017-12-18 ENCOUNTER — Encounter
Admission: EM | Disposition: A | Discharge status: Home Health | Payer: Self-pay | Source: Home / Self Care | Attending: Internal Medicine

## 2017-12-18 ENCOUNTER — Emergency Department: Payer: Medicare Other

## 2017-12-18 ENCOUNTER — Inpatient Hospital Stay: Payer: Medicare Other

## 2017-12-18 ENCOUNTER — Inpatient Hospital Stay
Admission: EM | Admit: 2017-12-18 | Discharge: 2017-12-20 | DRG: 470 | Disposition: A | Discharge status: Home Health | Payer: Medicare Other | Attending: Internal Medicine | Admitting: Internal Medicine

## 2017-12-18 DIAGNOSIS — G4700 Insomnia, unspecified: Secondary | ICD-10-CM | POA: Diagnosis present

## 2017-12-18 DIAGNOSIS — M810 Age-related osteoporosis without current pathological fracture: Secondary | ICD-10-CM | POA: Diagnosis present

## 2017-12-18 DIAGNOSIS — M069 Rheumatoid arthritis, unspecified: Secondary | ICD-10-CM | POA: Diagnosis present

## 2017-12-18 DIAGNOSIS — I10 Essential (primary) hypertension: Secondary | ICD-10-CM | POA: Diagnosis present

## 2017-12-18 DIAGNOSIS — I480 Paroxysmal atrial fibrillation: Secondary | ICD-10-CM | POA: Diagnosis present

## 2017-12-18 DIAGNOSIS — E039 Hypothyroidism, unspecified: Secondary | ICD-10-CM | POA: Diagnosis present

## 2017-12-18 DIAGNOSIS — E785 Hyperlipidemia, unspecified: Secondary | ICD-10-CM | POA: Diagnosis present

## 2017-12-18 DIAGNOSIS — S72012A Unspecified intracapsular fracture of left femur, initial encounter for closed fracture: Secondary | ICD-10-CM | POA: Diagnosis present

## 2017-12-18 DIAGNOSIS — Y92008 Other place in unspecified non-institutional (private) residence as the place of occurrence of the external cause: Secondary | ICD-10-CM | POA: Diagnosis not present

## 2017-12-18 DIAGNOSIS — W010XXA Fall on same level from slipping, tripping and stumbling without subsequent striking against object, initial encounter: Secondary | ICD-10-CM | POA: Diagnosis present

## 2017-12-18 DIAGNOSIS — S72002A Fracture of unspecified part of neck of left femur, initial encounter for closed fracture: Secondary | ICD-10-CM

## 2017-12-18 DIAGNOSIS — Z87891 Personal history of nicotine dependence: Secondary | ICD-10-CM

## 2017-12-18 DIAGNOSIS — Z96649 Presence of unspecified artificial hip joint: Secondary | ICD-10-CM

## 2017-12-18 DIAGNOSIS — M25552 Pain in left hip: Secondary | ICD-10-CM | POA: Diagnosis present

## 2017-12-18 DIAGNOSIS — H3530 Unspecified macular degeneration: Secondary | ICD-10-CM | POA: Diagnosis present

## 2017-12-18 DIAGNOSIS — W19XXXA Unspecified fall, initial encounter: Secondary | ICD-10-CM

## 2017-12-18 DIAGNOSIS — E119 Type 2 diabetes mellitus without complications: Secondary | ICD-10-CM | POA: Diagnosis present

## 2017-12-18 DIAGNOSIS — Z96642 Presence of left artificial hip joint: Secondary | ICD-10-CM | POA: Diagnosis present

## 2017-12-18 DIAGNOSIS — Z79899 Other long term (current) drug therapy: Secondary | ICD-10-CM | POA: Diagnosis not present

## 2017-12-18 HISTORY — PX: HIP ARTHROPLASTY: SHX981

## 2017-12-18 LAB — URINALYSIS, COMPLETE (UACMP) WITH MICROSCOPIC
Bilirubin Urine: NEGATIVE
Glucose, UA: NEGATIVE mg/dL
HGB URINE DIPSTICK: NEGATIVE
KETONES UR: NEGATIVE mg/dL
Nitrite: NEGATIVE
PROTEIN: NEGATIVE mg/dL
Specific Gravity, Urine: 1.01 (ref 1.005–1.030)
pH: 5 (ref 5.0–8.0)

## 2017-12-18 LAB — CBC WITH DIFFERENTIAL/PLATELET
BASOS PCT: 1 %
Basophils Absolute: 0.1 10*3/uL (ref 0–0.1)
EOS ABS: 0.1 10*3/uL (ref 0–0.7)
EOS PCT: 1 %
HCT: 37.3 % (ref 35.0–47.0)
Hemoglobin: 12.7 g/dL (ref 12.0–16.0)
Lymphocytes Relative: 15 %
Lymphs Abs: 1.6 10*3/uL (ref 1.0–3.6)
MCH: 34.5 pg — AB (ref 26.0–34.0)
MCHC: 34.1 g/dL (ref 32.0–36.0)
MCV: 101 fL — ABNORMAL HIGH (ref 80.0–100.0)
Monocytes Absolute: 0.7 10*3/uL (ref 0.2–0.9)
Monocytes Relative: 6 %
Neutro Abs: 8.4 10*3/uL — ABNORMAL HIGH (ref 1.4–6.5)
Neutrophils Relative %: 77 %
PLATELETS: 170 10*3/uL (ref 150–440)
RBC: 3.69 MIL/uL — AB (ref 3.80–5.20)
RDW: 13.3 % (ref 11.5–14.5)
WBC: 10.8 10*3/uL (ref 3.6–11.0)

## 2017-12-18 LAB — HEMOGLOBIN A1C
Hgb A1c MFr Bld: 5.6 % (ref 4.8–5.6)
MEAN PLASMA GLUCOSE: 114.02 mg/dL

## 2017-12-18 LAB — COMPREHENSIVE METABOLIC PANEL
ALT: 19 U/L (ref 14–54)
AST: 34 U/L (ref 15–41)
Albumin: 4.4 g/dL (ref 3.5–5.0)
Alkaline Phosphatase: 45 U/L (ref 38–126)
Anion gap: 9 (ref 5–15)
BUN: 17 mg/dL (ref 6–20)
CHLORIDE: 105 mmol/L (ref 101–111)
CO2: 23 mmol/L (ref 22–32)
CREATININE: 0.69 mg/dL (ref 0.44–1.00)
Calcium: 9.2 mg/dL (ref 8.9–10.3)
GFR calc non Af Amer: >60 mL/min (ref >60)
Glucose, Bld: 99 mg/dL (ref 65–99)
Potassium: 4.5 mmol/L (ref 3.5–5.1)
SODIUM: 137 mmol/L (ref 135–145)
Total Bilirubin: 0.6 mg/dL (ref 0.3–1.2)
Total Protein: 7.1 g/dL (ref 6.5–8.1)

## 2017-12-18 LAB — GLUCOSE, CAPILLARY
GLUCOSE-CAPILLARY: 92 mg/dL (ref 65–99)
GLUCOSE-CAPILLARY: 101 mg/dL — AB (ref 65–99)
GLUCOSE-CAPILLARY: 128 mg/dL — AB (ref 65–99)
Glucose-Capillary: 122 mg/dL — ABNORMAL HIGH (ref 65–99)

## 2017-12-18 LAB — TROPONIN I: Troponin I: <0.03 ng/mL (ref <0.03)

## 2017-12-18 LAB — TSH: TSH: 1.511 u[IU]/mL (ref 0.350–4.500)

## 2017-12-18 LAB — MRSA PCR SCREENING: MRSA BY PCR: NEGATIVE

## 2017-12-18 SURGERY — HEMIARTHROPLASTY, HIP, DIRECT ANTERIOR APPROACH, FOR FRACTURE
Anesthesia: Spinal | Site: Hip | Laterality: Left | Wound class: Clean

## 2017-12-18 MED ORDER — MORPHINE SULFATE 2 MG/ML IV SOLN
INTRAVENOUS | Status: DC
  Filled 2017-12-18: qty 25

## 2017-12-18 MED ORDER — ONDANSETRON HCL 4 MG/2ML IJ SOLN: 4.0000 mg | Freq: Four times a day (QID) | INTRAMUSCULAR | Status: DC | PRN

## 2017-12-18 MED ORDER — LEVOTHYROXINE SODIUM 25 MCG PO TABS
25.0000 ug | ORAL_TABLET | Freq: Every day | ORAL | Status: DC
  Administered 2017-12-19 – 2017-12-20 (×2): 25 ug via ORAL
  Filled 2017-12-18 (×2): qty 1

## 2017-12-18 MED ORDER — NICOTINE 14 MG/24HR TD PT24
14.0000 mg | MEDICATED_PATCH | Freq: Every day | TRANSDERMAL | Status: DC
  Filled 2017-12-18 (×2): qty 1

## 2017-12-18 MED ORDER — HYDROMORPHONE HCL 1 MG/ML IJ SOLN
1.0000 mg | INTRAMUSCULAR | Status: DC | PRN
  Administered 2017-12-18: 1 mg via INTRAVENOUS
  Filled 2017-12-18: qty 1

## 2017-12-18 MED ORDER — ALENDRONATE SODIUM 70 MG PO TABS: 70.0000 mg | ORAL_TABLET | ORAL | Status: DC

## 2017-12-18 MED ORDER — SODIUM CHLORIDE 0.9 % IJ SOLN
INTRAMUSCULAR | Status: AC
  Filled 2017-12-18: qty 50

## 2017-12-18 MED ORDER — BUPIVACAINE LIPOSOME 1.3 % IJ SUSP
INTRAMUSCULAR | Status: AC
  Filled 2017-12-18: qty 20

## 2017-12-18 MED ORDER — HYDROXYCHLOROQUINE SULFATE 200 MG PO TABS
300.0000 mg | ORAL_TABLET | Freq: Every day | ORAL | Status: DC
  Administered 2017-12-20: 300 mg via ORAL
  Filled 2017-12-18 (×2): qty 2

## 2017-12-18 MED ORDER — ENOXAPARIN SODIUM 40 MG/0.4ML ~~LOC~~ SOLN
40.0000 mg | SUBCUTANEOUS | Status: DC
  Administered 2017-12-19 – 2017-12-20 (×2): 40 mg via SUBCUTANEOUS
  Filled 2017-12-18 (×3): qty 0.4

## 2017-12-18 MED ORDER — ONDANSETRON HCL 4 MG/2ML IJ SOLN
4.0000 mg | Freq: Four times a day (QID) | INTRAMUSCULAR | Status: DC | PRN
  Administered 2017-12-19: 4 mg via INTRAVENOUS
  Filled 2017-12-18 (×2): qty 2

## 2017-12-18 MED ORDER — OXYCODONE HCL 5 MG PO TABS
10.0000 mg | ORAL_TABLET | ORAL | Status: DC | PRN
  Administered 2017-12-19 – 2017-12-20 (×5): 10 mg via ORAL
  Filled 2017-12-18 (×5): qty 2

## 2017-12-18 MED ORDER — SODIUM CHLORIDE 0.9% FLUSH: 3.0000 mL | INTRAVENOUS | Status: DC | PRN

## 2017-12-18 MED ORDER — FENTANYL CITRATE (PF) 100 MCG/2ML IJ SOLN
INTRAMUSCULAR | Status: DC | PRN
  Administered 2017-12-18 (×4): 50 ug via INTRAVENOUS

## 2017-12-18 MED ORDER — EPINEPHRINE PF 1 MG/ML IJ SOLN
INTRAMUSCULAR | Status: AC
  Filled 2017-12-18: qty 1

## 2017-12-18 MED ORDER — NEOMYCIN-POLYMYXIN B GU 40-200000 IR SOLN
Status: AC
Start: 2017-12-18 — End: 2017-12-18
  Filled 2017-12-18: qty 20

## 2017-12-18 MED ORDER — ALBUTEROL SULFATE (2.5 MG/3ML) 0.083% IN NEBU: 2.5000 mg | INHALATION_SOLUTION | Freq: Four times a day (QID) | RESPIRATORY_TRACT | Status: DC | PRN

## 2017-12-18 MED ORDER — PROPOFOL 500 MG/50ML IV EMUL
INTRAVENOUS | Status: DC | PRN
  Administered 2017-12-18: 50 ug/kg/min via INTRAVENOUS

## 2017-12-18 MED ORDER — ONDANSETRON HCL 4 MG/2ML IJ SOLN
4.0000 mg | Freq: Once | INTRAMUSCULAR | Status: AC | PRN
  Administered 2017-12-18: 4 mg via INTRAVENOUS

## 2017-12-18 MED ORDER — MORPHINE SULFATE (PF) 2 MG/ML IV SOLN
2.0000 mg | INTRAVENOUS | Status: DC | PRN
  Administered 2017-12-18: 2 mg via INTRAVENOUS
  Filled 2017-12-18: qty 1

## 2017-12-18 MED ORDER — DILTIAZEM HCL ER 180 MG PO CP24
180.0000 mg | ORAL_CAPSULE | Freq: Every day | ORAL | Status: DC
  Administered 2017-12-20: 180 mg via ORAL
  Filled 2017-12-18 (×3): qty 1

## 2017-12-18 MED ORDER — METOCLOPRAMIDE HCL 5 MG/ML IJ SOLN: 5.0000 mg | Freq: Three times a day (TID) | INTRAMUSCULAR | Status: DC | PRN

## 2017-12-18 MED ORDER — TRANEXAMIC ACID 1000 MG/10ML IV SOLN
1000.0000 mg | Freq: Once | INTRAVENOUS | Status: AC
  Administered 2017-12-18: 1000 mg via INTRAVENOUS
  Filled 2017-12-18: qty 10

## 2017-12-18 MED ORDER — ACETAMINOPHEN 325 MG PO TABS: 650.0000 mg | ORAL_TABLET | ORAL | Status: DC | PRN

## 2017-12-18 MED ORDER — NALOXONE HCL 0.4 MG/ML IJ SOLN: 0.4000 mg | INTRAMUSCULAR | Status: DC | PRN

## 2017-12-18 MED ORDER — FENTANYL CITRATE (PF) 100 MCG/2ML IJ SOLN: 25.0000 ug | INTRAMUSCULAR | Status: DC | PRN

## 2017-12-18 MED ORDER — POLYETHYLENE GLYCOL 3350 17 G PO PACK: 17.0000 g | PACK | Freq: Every day | ORAL | Status: DC | PRN

## 2017-12-18 MED ORDER — DIPHENHYDRAMINE HCL 12.5 MG/5ML PO ELIX: 12.5000 mg | ORAL_SOLUTION | Freq: Four times a day (QID) | ORAL | Status: DC | PRN

## 2017-12-18 MED ORDER — TRAZODONE HCL 50 MG PO TABS
50.0000 mg | ORAL_TABLET | Freq: Every day | ORAL | Status: DC
  Filled 2017-12-18: qty 1

## 2017-12-18 MED ORDER — OXYCODONE HCL 5 MG PO TABS: 5.0000 mg | ORAL_TABLET | ORAL | Status: DC | PRN

## 2017-12-18 MED ORDER — BUPIVACAINE-EPINEPHRINE (PF) 0.25% -1:200000 IJ SOLN
INTRAMUSCULAR | Status: DC | PRN
  Administered 2017-12-18: 30 mL

## 2017-12-18 MED ORDER — ACETAMINOPHEN 650 MG RE SUPP: 650.0000 mg | Freq: Four times a day (QID) | RECTAL | Status: DC | PRN

## 2017-12-18 MED ORDER — HYDROMORPHONE HCL 1 MG/ML IJ SOLN
0.5000 mg | Freq: Once | INTRAMUSCULAR | Status: AC
  Administered 2017-12-18: 0.5 mg via INTRAVENOUS
  Filled 2017-12-18: qty 1

## 2017-12-18 MED ORDER — INSULIN ASPART 100 UNIT/ML ~~LOC~~ SOLN: 0.0000 [IU] | Freq: Three times a day (TID) | SUBCUTANEOUS | Status: DC

## 2017-12-18 MED ORDER — DOCUSATE SODIUM 100 MG PO CAPS
100.0000 mg | ORAL_CAPSULE | Freq: Two times a day (BID) | ORAL | Status: DC
  Administered 2017-12-19 – 2017-12-20 (×3): 100 mg via ORAL
  Filled 2017-12-18 (×3): qty 1

## 2017-12-18 MED ORDER — GABAPENTIN 400 MG PO CAPS
400.0000 mg | ORAL_CAPSULE | Freq: Every day | ORAL | Status: DC
  Administered 2017-12-19: 400 mg via ORAL
  Filled 2017-12-18: qty 1

## 2017-12-18 MED ORDER — ACETAMINOPHEN 500 MG PO TABS
1000.0000 mg | ORAL_TABLET | Freq: Four times a day (QID) | ORAL | Status: AC
  Administered 2017-12-19: 1000 mg via ORAL
  Filled 2017-12-18 (×3): qty 2

## 2017-12-18 MED ORDER — BUPIVACAINE HCL (PF) 0.25 % IJ SOLN
INTRAMUSCULAR | Status: AC
  Filled 2017-12-18: qty 30

## 2017-12-18 MED ORDER — DOCUSATE SODIUM 100 MG PO CAPS: 100.0000 mg | ORAL_CAPSULE | Freq: Two times a day (BID) | ORAL | Status: DC

## 2017-12-18 MED ORDER — SODIUM CHLORIDE 0.9 % IV SOLN
INTRAVENOUS | Status: DC | PRN
  Administered 2017-12-18: 20:00:00 via INTRAVENOUS

## 2017-12-18 MED ORDER — FENTANYL CITRATE (PF) 100 MCG/2ML IJ SOLN
INTRAMUSCULAR | Status: AC
  Filled 2017-12-18: qty 2

## 2017-12-18 MED ORDER — TRANEXAMIC ACID 1000 MG/10ML IV SOLN: INTRAVENOUS | Status: DC | PRN

## 2017-12-18 MED ORDER — ONDANSETRON HCL 4 MG/2ML IJ SOLN
4.0000 mg | Freq: Once | INTRAMUSCULAR | Status: AC
  Administered 2017-12-18: 4 mg via INTRAVENOUS
  Filled 2017-12-18: qty 2

## 2017-12-18 MED ORDER — PROPOFOL 10 MG/ML IV BOLUS
INTRAVENOUS | Status: AC
  Filled 2017-12-18: qty 20

## 2017-12-18 MED ORDER — METOCLOPRAMIDE HCL 10 MG PO TABS: 5.0000 mg | ORAL_TABLET | Freq: Three times a day (TID) | ORAL | Status: DC | PRN

## 2017-12-18 MED ORDER — CLINDAMYCIN PHOSPHATE 600 MG/50ML IV SOLN
600.0000 mg | Freq: Once | INTRAVENOUS | Status: DC
Start: 2017-12-18 — End: 2017-12-18
  Filled 2017-12-18 (×2): qty 50

## 2017-12-18 MED ORDER — ONDANSETRON HCL 4 MG PO TABS
4.0000 mg | ORAL_TABLET | Freq: Four times a day (QID) | ORAL | Status: DC | PRN
  Administered 2017-12-19: 4 mg via ORAL
  Filled 2017-12-18: qty 1

## 2017-12-18 MED ORDER — FLEET ENEMA 7-19 GM/118ML RE ENEM: 1.0000 | ENEMA | Freq: Once | RECTAL | Status: DC | PRN

## 2017-12-18 MED ORDER — CROMOLYN SODIUM 5.2 MG/ACT NA AERS
1.0000 | INHALATION_SPRAY | Freq: Three times a day (TID) | NASAL | Status: DC | PRN
  Filled 2017-12-18: qty 26

## 2017-12-18 MED ORDER — SODIUM CHLORIDE 0.9 % IV SOLN: 250.0000 mL | INTRAVENOUS | Status: DC | PRN

## 2017-12-18 MED ORDER — DIPHENHYDRAMINE HCL 12.5 MG/5ML PO ELIX: 12.5000 mg | ORAL_SOLUTION | ORAL | Status: DC | PRN

## 2017-12-18 MED ORDER — ACETAMINOPHEN 325 MG PO TABS: 650.0000 mg | ORAL_TABLET | Freq: Four times a day (QID) | ORAL | Status: DC | PRN

## 2017-12-18 MED ORDER — BUPIVACAINE HCL (PF) 0.5 % IJ SOLN
INTRAMUSCULAR | Status: DC | PRN
  Administered 2017-12-18: 3 mL

## 2017-12-18 MED ORDER — FOLIC ACID 1 MG PO TABS
1.0000 mg | ORAL_TABLET | Freq: Every day | ORAL | Status: DC
  Administered 2017-12-20: 1 mg via ORAL
  Filled 2017-12-18 (×2): qty 1

## 2017-12-18 MED ORDER — MAGNESIUM HYDROXIDE 400 MG/5ML PO SUSP
30.0000 mL | Freq: Every day | ORAL | Status: DC | PRN
  Administered 2017-12-19: 30 mL via ORAL
  Filled 2017-12-18: qty 30

## 2017-12-18 MED ORDER — PROPOFOL 500 MG/50ML IV EMUL
INTRAVENOUS | Status: AC
  Filled 2017-12-18: qty 50

## 2017-12-18 MED ORDER — ALBUTEROL SULFATE HFA 108 (90 BASE) MCG/ACT IN AERS
1.0000 | INHALATION_SPRAY | Freq: Four times a day (QID) | RESPIRATORY_TRACT | Status: DC | PRN
Start: 2017-12-18 — End: 2017-12-18

## 2017-12-18 MED ORDER — PROPOFOL 10 MG/ML IV BOLUS
INTRAVENOUS | Status: DC | PRN
  Administered 2017-12-18: 20 mg via INTRAVENOUS
  Administered 2017-12-18: 50 mg via INTRAVENOUS
  Administered 2017-12-18: 20 mg via INTRAVENOUS

## 2017-12-18 MED ORDER — SODIUM CHLORIDE 0.9% FLUSH
3.0000 mL | Freq: Two times a day (BID) | INTRAVENOUS | Status: DC
  Administered 2017-12-18 – 2017-12-20 (×3): 3 mL via INTRAVENOUS

## 2017-12-18 MED ORDER — ONDANSETRON HCL 4 MG/2ML IJ SOLN
INTRAMUSCULAR | Status: AC
  Filled 2017-12-18: qty 2

## 2017-12-18 MED ORDER — PHENYLEPHRINE HCL 10 MG/ML IJ SOLN
INTRAMUSCULAR | Status: DC | PRN
  Administered 2017-12-18: 20 ug/min via INTRAVENOUS

## 2017-12-18 MED ORDER — CLINDAMYCIN PHOSPHATE 600 MG/50ML IV SOLN
600.0000 mg | Freq: Four times a day (QID) | INTRAVENOUS | Status: AC
  Administered 2017-12-19 (×3): 600 mg via INTRAVENOUS
  Filled 2017-12-18 (×3): qty 50

## 2017-12-18 MED ORDER — BISACODYL 10 MG RE SUPP
10.0000 mg | Freq: Every day | RECTAL | Status: DC | PRN
  Filled 2017-12-18: qty 1

## 2017-12-18 MED ORDER — POTASSIUM CHLORIDE IN NACL 20-0.9 MEQ/L-% IV SOLN
INTRAVENOUS | Status: DC
  Administered 2017-12-19: 01:00:00 via INTRAVENOUS
  Filled 2017-12-18 (×2): qty 1000

## 2017-12-18 MED ORDER — ACETAMINOPHEN 650 MG RE SUPP: 650.0000 mg | RECTAL | Status: DC | PRN

## 2017-12-18 MED ORDER — HYDROMORPHONE HCL 1 MG/ML IJ SOLN: 1.0000 mg | INTRAMUSCULAR | Status: DC | PRN

## 2017-12-18 MED ORDER — TRANEXAMIC ACID 1000 MG/10ML IV SOLN
INTRAVENOUS | Status: AC
  Filled 2017-12-18: qty 10

## 2017-12-18 MED ORDER — FLUTICASONE PROPIONATE 50 MCG/ACT NA SUSP
2.0000 | Freq: Every day | NASAL | Status: DC
  Filled 2017-12-18: qty 16

## 2017-12-18 MED ORDER — DIPHENHYDRAMINE HCL 50 MG/ML IJ SOLN: 12.5000 mg | Freq: Four times a day (QID) | INTRAMUSCULAR | Status: DC | PRN

## 2017-12-18 MED ORDER — INSULIN ASPART 100 UNIT/ML ~~LOC~~ SOLN: 0.0000 [IU] | Freq: Every day | SUBCUTANEOUS | Status: DC

## 2017-12-18 MED ORDER — LISINOPRIL 20 MG PO TABS
20.0000 mg | ORAL_TABLET | Freq: Every day | ORAL | Status: DC
  Administered 2017-12-19 – 2017-12-20 (×2): 20 mg via ORAL
  Filled 2017-12-18 (×2): qty 1

## 2017-12-18 MED ORDER — ONDANSETRON HCL 4 MG PO TABS: 4.0000 mg | ORAL_TABLET | Freq: Four times a day (QID) | ORAL | Status: DC | PRN

## 2017-12-18 MED ORDER — SODIUM CHLORIDE 0.9% FLUSH: 9.0000 mL | INTRAVENOUS | Status: DC | PRN

## 2017-12-18 MED ORDER — PANTOPRAZOLE SODIUM 40 MG PO TBEC
40.0000 mg | DELAYED_RELEASE_TABLET | Freq: Every day | ORAL | Status: DC
  Administered 2017-12-19 – 2017-12-20 (×2): 40 mg via ORAL
  Filled 2017-12-18 (×2): qty 1

## 2017-12-18 MED ORDER — NEOMYCIN-POLYMYXIN B GU 40-200000 IR SOLN
Status: DC | PRN
  Administered 2017-12-18: 16 mL

## 2017-12-18 MED ORDER — BUPIVACAINE LIPOSOME 1.3 % IJ SUSP
INTRAMUSCULAR | Status: DC | PRN
  Administered 2017-12-18: 20 mL

## 2017-12-18 SURGICAL SUPPLY — 57 items
BAG DECANTER FOR FLEXI CONT (MISCELLANEOUS) IMPLANT
BLADE SAGITTAL WIDE XTHICK NO (BLADE) ×3 IMPLANT
BLADE SURG SZ20 CARB STEEL (BLADE) ×3 IMPLANT
BNDG COHESIVE 6X5 TAN STRL LF (GAUZE/BANDAGES/DRESSINGS) ×3 IMPLANT
BOWL CEMENT MIXING ADV NOZZLE (MISCELLANEOUS) IMPLANT
CANISTER SUCT 1200ML W/VALVE (MISCELLANEOUS) ×3 IMPLANT
CANISTER SUCT 3000ML PPV (MISCELLANEOUS) ×6 IMPLANT
CAPT HIP HEMI 2 ×3 IMPLANT
CHLORAPREP W/TINT 26ML (MISCELLANEOUS) ×6 IMPLANT
DECANTER SPIKE VIAL GLASS SM (MISCELLANEOUS) ×6 IMPLANT
DRAPE IMP U-DRAPE 54X76 (DRAPES) ×6 IMPLANT
DRAPE INCISE IOBAN 66X60 STRL (DRAPES) ×3 IMPLANT
DRAPE SHEET LG 3/4 BI-LAMINATE (DRAPES) ×3 IMPLANT
DRAPE SURG 17X11 SM STRL (DRAPES) ×3 IMPLANT
DRAPE SURG 17X23 STRL (DRAPES) ×3 IMPLANT
DRSG OPSITE POSTOP 4X12 (GAUZE/BANDAGES/DRESSINGS) IMPLANT
DRSG OPSITE POSTOP 4X14 (GAUZE/BANDAGES/DRESSINGS) IMPLANT
ELECT BLADE 6.5 EXT (BLADE) ×3 IMPLANT
ELECT CAUTERY BLADE 6.4 (BLADE) ×3 IMPLANT
ELECT REM PT RETURN 9FT ADLT (ELECTROSURGICAL) ×3
ELECTRODE REM PT RTRN 9FT ADLT (ELECTROSURGICAL) ×1 IMPLANT
GAUZE PACK 2X3YD (MISCELLANEOUS) IMPLANT
GLOVE BIO SURGEON STRL SZ8 (GLOVE) ×30 IMPLANT
GLOVE INDICATOR 8.0 STRL GRN (GLOVE) ×9 IMPLANT
GOWN STRL REUS W/ TWL LRG LVL3 (GOWN DISPOSABLE) ×2 IMPLANT
GOWN STRL REUS W/ TWL XL LVL3 (GOWN DISPOSABLE) ×1 IMPLANT
GOWN STRL REUS W/TWL LRG LVL3 (GOWN DISPOSABLE) ×4
GOWN STRL REUS W/TWL XL LVL3 (GOWN DISPOSABLE) ×2
HOOD PEEL AWAY FLYTE STAYCOOL (MISCELLANEOUS) ×6 IMPLANT
IV NS 100ML SINGLE PACK (IV SOLUTION) IMPLANT
LABEL OR SOLS (LABEL) ×3 IMPLANT
NDL SAFETY ECLIPSE 18X1.5 (NEEDLE) ×1 IMPLANT
NEEDLE FILTER BLUNT 18X 1/2SAF (NEEDLE) ×2
NEEDLE FILTER BLUNT 18X1 1/2 (NEEDLE) ×1 IMPLANT
NEEDLE HYPO 18GX1.5 SHARP (NEEDLE) ×2
NEEDLE SPNL 20GX3.5 QUINCKE YW (NEEDLE) ×3 IMPLANT
NS IRRIG 1000ML POUR BTL (IV SOLUTION) ×3 IMPLANT
PACK HIP PROSTHESIS (MISCELLANEOUS) ×3 IMPLANT
PILLOW ABDUC SM (MISCELLANEOUS) ×3 IMPLANT
PULSAVAC PLUS IRRIG FAN TIP (DISPOSABLE) ×3
SOL .9 NS 3000ML IRR  AL (IV SOLUTION) ×4
SOL .9 NS 3000ML IRR UROMATIC (IV SOLUTION) ×2 IMPLANT
STAPLER SKIN PROX 35W (STAPLE) ×3 IMPLANT
STRAP SAFETY 5IN WIDE (MISCELLANEOUS) ×3 IMPLANT
SUT ETHIBOND 2 V 37 (SUTURE) ×9 IMPLANT
SUT VIC AB 1 CT1 36 (SUTURE) ×6 IMPLANT
SUT VIC AB 2-0 CT1 (SUTURE) ×9 IMPLANT
SUT VIC AB 2-0 CT1 27 (SUTURE) ×6
SUT VIC AB 2-0 CT1 TAPERPNT 27 (SUTURE) ×3 IMPLANT
SUT VICRYL 1-0 27IN ABS (SUTURE) ×6
SUTURE VICRYL 1-0 27IN ABS (SUTURE) ×2 IMPLANT
SYR 10ML LL (SYRINGE) ×3 IMPLANT
SYR 30ML LL (SYRINGE) ×9 IMPLANT
SYR TB 1ML 27GX1/2 LL (SYRINGE) IMPLANT
TAPE TRANSPORE STRL 2 31045 (GAUZE/BANDAGES/DRESSINGS) ×3 IMPLANT
TIP BRUSH PULSAVAC PLUS 24.33 (MISCELLANEOUS) ×3 IMPLANT
TIP FAN IRRIG PULSAVAC PLUS (DISPOSABLE) ×1 IMPLANT

## 2017-12-18 NOTE — Anesthesia Post-op Follow-up Note (Signed)
Anesthesia QCDR form completed.        

## 2017-12-18 NOTE — Op Note (Signed)
12/18/2017  10:12 PM  Patient:   Alexis Owens  Pre-Op Diagnosis:   Displaced femoral neck fracture, left hip.  Post-Op Diagnosis:   Same.  Procedure:   Left hip unipolar hemiarthroplasty.  Surgeon:   Pascal Lux, MD  Assistant:   Cameron Proud, PA-C  Anesthesia:   Spinal  Findings:   As above.  Complications:   None  EBL:   150 cc  Fluids:   900 cc crystalloid  UOP:   550 cc  TT:   None  Drains:   None  Closure:   Staples  Implants:   Biomet press-fit system with a #10 standard offset reduced proximal profile Echo femoral stem, a 46 mm outer diameter shell, and a -6 mm neck adapter.  Brief Clinical Note:   The patient is a 77 year old female who sustained the above-noted injury this morning when she was knocked over by her large pet dog. She was brought to the emergency room where x-rays confirmed the presence of a displaced left femoral neck fracture. The patient has been cleared medically and presents at this time for definitive management of the injury.  Procedure:   The patient was brought into the operating room. After adequate spinal anesthesia was obtained, the patient was repositioned in the right lateral decubitus position and secured using a lateral hip positioner. The left hip and lower extremity were prepped with ChloroPrep solution before being draped sterilely. Preoperative antibiotics were administered. A timeout was performed to verify the appropriate surgical site before a standard posterior approach to the hip was made through an approximately 4-5 inch incision. The incision was carried down through the subcutaneous tissues to expose the gluteal fascia and proximal end of the iliotibial band. These structures were split the length of the incision and the Charnley self-retaining hip retractor placed. The bursal tissues were swept posteriorly to expose the short external rotators. The anterior border of the piriformis tendon was identified and this plane  developed down through the capsule to enter the joint. Abundant fracture hematoma was suctioned. A flap of tissue was elevated off the posterior aspect of the femoral neck and greater trochanter and retracted posteriorly. This flap included the piriformis tendon, the short external rotators, and the posterior capsule. The femoral head was removed in its entirety, then taken to the back table where it was measured and found to be optimally replicated by a 46 mm head. The appropriate trial head was inserted and found to demonstrate an excellent suction fit.   Attention was directed to the femoral side. The femoral neck was recut 10-12 mm above the lesser trochanter using an oscillating saw. The piriformis fossa was debrided of soft tissues before the intramedullary canal was accessed through this point using a triple step reamer. The canal was reamed sequentially beginning with a #7 tapered reamer and progressing to a #10 tapered reamer. This provided excellent circumferential chatter. A box osteotome was used to establish version before the canal was broached sequentially beginning with a #7 broach and progressing to a #10 broach. This was left in place and several trial reductions performed. The permanent #10 standard offset narrow small profile femoral stem was impacted into place. A repeat trial reduction was performed using both the -6 mm and -3 mm neck lengths. The -6 mm neck length demonstrated excellent stability both in extension and external rotation as well as with flexion to 90 and internal rotation beyond 70. It also was stable in the position of sleep. The -3 mm  neck length appeared to be too tight in extension. The 46 mm outer diameter shell with the -6 mm neck adapter construct was put together on the back table before being impacted onto the stem of the femoral component. The Morse taper locking mechanism was verified using manual distraction before the head was relocated and placed through a  range of motion with the findings as described above.  The wound was copiously irrigated with bacitracin saline solution via the jet lavage system before the peri-incisional and pericapsular tissues were injected with 30 cc of 0.5% Sensorcaine with epinephrine and 20 cc of Exparel diluted out to 60 cc with normal saline to help with postoperative analgesia. The posterior flap was reapproximated to the posterior aspect of the greater trochanter using #2 Tycron interrupted sutures placed through drill holes. The iliotibial band was reapproximated using #1 Vicryl interrupted sutures before the gluteal fascia was closed using a running #1 Vicryl suture. The subcutaneous tissues were closed in several layers using 2-0 Vicryl interrupted sutures before the skin was closed using staples. A sterile occlusive dressing was applied to the wound before the patient was placed into an abduction wedge pillow. The patient was then rolled back into the supine position on the hospital bed before being awakened and returned to the recovery room in satisfactory condition after tolerating the procedure well.

## 2017-12-18 NOTE — NC FL2 (Signed)
[place] LEVEL OF CARE SCREENING TOOL     IDENTIFICATION  Patient Name: Alexis Owens Birthdate: 31-Dec-1940 Sex: female Admission Date (Current Location): 12/18/2017  [place] and [place] Number:  Engineering geologist and Address:  [facility], [address] [place],  [address]      Provider Number: [id number]  Attending Physician Name and Address:  Gorden Harms, MD  Relative Name and Phone Number:       Current Level of Care: Hospital Recommended Level of Care: [place] Prior Approval Number:    Date Approved/Denied:   PASRR Number: [id number])  Discharge Plan: SNF    Current Diagnoses: Patient Active Problem List   Diagnosis Date Noted  . Hip fx ([place]) 12/18/2017  . Hypertension 07/24/2017  . Paroxysmal A-fib ([place]) 07/24/2017  . Hypothyroidism 07/24/2017  . Rheumatoid arthritis ([place]) 07/24/2017  . High risk medication use 07/24/2017  . Prediabetes 07/24/2017  . Insomnia 07/24/2017  . Macular degeneration 07/24/2017  . Hyperlipidemia 07/24/2017  . Allergic rhinitis 07/24/2017  . Osteoporosis 07/24/2017  . Neuropathic pain 07/24/2017  . Other chronic pain 07/24/2017    Orientation RESPIRATION BLADDER Height & Weight     Self, Time, Situation, Place  Normal Continent Weight: 108 lb (49 kg) Height:  5\' 2"  (157.5 cm)  BEHAVIORAL SYMPTOMS/MOOD NEUROLOGICAL BOWEL NUTRITION STATUS      Continent Diet(NPO for surgery. )  AMBULATORY STATUS COMMUNICATION OF NEEDS Skin   Extensive Assist Verbally Surgical wounds                       Personal Care Assistance Level of Assistance  Bathing, Feeding, Dressing Bathing Assistance: Limited assistance Feeding assistance: Independent Dressing Assistance: Limited assistance     Functional Limitations Info  Sight, Hearing, Speech Sight Info: Adequate Hearing Info: Adequate Speech Info: Adequate    SPECIAL CARE FACTORS FREQUENCY  PT (By  licensed PT), OT (By licensed OT)     PT Frequency: (5) OT Frequency: (5)            Contractures      Additional Factors Info  Code Status, Allergies Code Status Info: (Full Code. ) Allergies Info: (Penicillin G, Penicillins, Sulfa Antibiotics, Sulfasalazine)           Current Medications (12/18/2017):  This is the current hospital active medication list Current Facility-Administered Medications  Medication Dose Route Frequency Provider Last Rate Last Dose  . 0.9 %  sodium chloride infusion  250 mL Intravenous PRN Salary, Montell D, MD      . acetaminophen (TYLENOL) tablet 650 mg  650 mg Oral Q6H PRN Salary, Montell D, MD       Or  . acetaminophen (TYLENOL) suppository 650 mg  650 mg Rectal Q6H PRN Salary, Montell D, MD      . albuterol (PROVENTIL) (2.5 MG/3ML) 0.083% nebulizer solution 2.5 mg  2.5 mg Nebulization Q6H PRN Salary, Montell D, MD      . bisacodyl (DULCOLAX) suppository 10 mg  10 mg Rectal Daily PRN Salary, Montell D, MD      . clindamycin (CLEOCIN) IVPB 600 mg  600 mg Intravenous Once Poggi, Marshall Cork, MD      . cromolyn (NASALCROM) nasal spray 1 spray  1 spray Each Nare TID PRN Salary, Montell D, MD      . diltiazem (DILACOR XR) 24 hr capsule 180 mg  180 mg Oral Daily Salary, Avel Peace, MD      .  docusate sodium (COLACE) capsule 100 mg  100 mg Oral BID Salary, Montell D, MD      . fluticasone (FLONASE) 50 MCG/ACT nasal spray 2 spray  2 spray Each Nare Daily Salary, Montell D, MD      . Derrill Memo ON 03/13/7781] folic acid (FOLVITE) tablet 1 mg  1 mg Oral Daily Salary, Montell D, MD      . Derrill Memo ON 12/19/2017] gabapentin (NEURONTIN) capsule 400 mg  400 mg Oral Daily Salary, Montell D, MD      . HYDROmorphone (DILAUDID) injection 1 mg  1 mg Intravenous Q2H PRN Poggi, Marshall Cork, MD   1 mg at 12/18/17 1700  . hydroxychloroquine (PLAQUENIL) tablet 300 mg  300 mg Oral Daily Salary, Montell D, MD      . insulin aspart (novoLOG) injection 0-5 Units  0-5 Units Subcutaneous QHS Salary,  Montell D, MD      . insulin aspart (novoLOG) injection 0-9 Units  0-9 Units Subcutaneous TID WC Salary, Montell D, MD      . Derrill Memo ON 12/19/2017] levothyroxine (SYNTHROID, LEVOTHROID) tablet 25 mcg  25 mcg Oral QAC breakfast Salary, Montell D, MD      . Derrill Memo ON 12/19/2017] lisinopril (PRINIVIL,ZESTRIL) tablet 20 mg  20 mg Oral Daily Salary, Montell D, MD      . nicotine (NICODERM CQ - dosed in mg/24 hours) patch 14 mg  14 mg Transdermal Daily Salary, Montell D, MD      . ondansetron (ZOFRAN) tablet 4 mg  4 mg Oral Q6H PRN Salary, Montell D, MD       Or  . ondansetron (ZOFRAN) injection 4 mg  4 mg Intravenous Q6H PRN Salary, Montell D, MD      . Derrill Memo ON 12/19/2017] pantoprazole (PROTONIX) EC tablet 40 mg  40 mg Oral Daily Salary, Montell D, MD      . polyethylene glycol (MIRALAX / GLYCOLAX) packet 17 g  17 g Oral Daily PRN Salary, Montell D, MD      . sodium chloride flush (NS) 0.9 % injection 3 mL  3 mL Intravenous Q12H Salary, Montell D, MD      . sodium chloride flush (NS) 0.9 % injection 3 mL  3 mL Intravenous PRN Salary, Montell D, MD      . traZODone (DESYREL) tablet 50 mg  50 mg Oral QHS Salary, Montell D, MD         Discharge Medications: Please see discharge summary for a list of discharge medications.  Relevant Imaging Results:  Relevant Lab Results:   Additional Information (SSN: [id number])  Yakir Wenke, Veronia Beets, LCSW

## 2017-12-18 NOTE — Consult Note (Signed)
ORTHOPAEDIC CONSULTATION  REQUESTING PHYSICIAN: Salary, Avel Peace, MD  Chief Complaint:   Left hip pain.  History of Present Illness: Alexis Owens is a 77 y.o. female with a history of hypertension, hypothyroidism, atrial fibrillation, rheumatoid arthritis, and osteoporosis who lives independently.  Apparently she was out on her deck this morning when she was bumped by her large [place] dog and fell to the ground, injuring her left hip.  She was able to crawl to her phone and called 911.  She was brought to the emergency room where x-rays demonstrated a displaced left femoral neck fracture.  The patient notes a mild abrasion to the left earlobe but otherwise denies any associated injuries.  She did not lose consciousness.  She denies any lightheadedness, dizziness, chest pain, shortness of breath, or other symptoms which may have precipitated her fall.  Past Medical History:  Diagnosis Date  . Allergy   . Arthritis   . Bunion   . Chronic sinusitis   . High risk medication use   . Hyperlipidemia   . Hypertension   . Hypothyroidism   . Insomnia   . Macular degeneration   . Osteoporosis   . Paroxysmal A-fib ([place])   . Prediabetes   . Rheumatoid arthritis [facility])    Past Surgical History:  Procedure Laterality Date  . APPENDECTOMY    . LEG SURGERY     Rt leg surgery for Compound fracture of the Right tibia    Social History   Socioeconomic History  . Marital status: Married    Spouse name: None  . Number of children: None  . Years of education: None  . Highest education level: None  Social Needs  . Financial resource strain: None  . Food insecurity - worry: None  . Food insecurity - inability: None  . Transportation needs - medical: None  . Transportation needs - non-medical: None  Occupational History  . None  Tobacco Use  . Smoking status: Former Smoker    Last attempt to quit: 07/19/2014    Years  since quitting: 3.4  . Smokeless tobacco: Never Used  Substance and Sexual Activity  . Alcohol use: Yes  . Drug use: No  . Sexual activity: None  Other Topics Concern  . None  Social History Narrative  . None   Family History  Problem Relation Age of Onset  . Breast cancer Sister   . Heart disease Brother   . Diabetes Brother    Allergies  Allergen Reactions  . Penicillin G Hives  . Penicillins Hives    Has patient had a PCN reaction causing immediate rash, facial/tongue/throat swelling, SOB or lightheadedness with hypotension: Yes Has patient had a PCN reaction causing severe rash involving mucus membranes or skin necrosis: No Has patient had a PCN reaction that required hospitalization: No Has patient had a PCN reaction occurring within the last 10 years: No If all of the above answers are "NO", then may proceed with Cephalosporin use.  . Sulfa Antibiotics Other (See Comments)    Mouth sores Other reaction(s): Other (See Comments) Mouth sores  . Sulfasalazine     Other reaction(s): Other (See Comments) Mouth sores   Prior to Admission medications   Medication Sig Start Date End Date Taking? Authorizing Provider  alendronate (FOSAMAX) 70 MG tablet TAKE 1 TABLET BY MOUTH ONCE A WEEK, WITH A FULL GLASS OF WATER ON AN EMPTY STOMACH, REMAIN UPRIGHT FOR 30 MINUTES 11/11/17  Yes Mikey College, NP  diltiazem (DILACOR XR) 180  MG 24 hr capsule Take 1 capsule (180 mg total) by mouth daily. 10/14/17  Yes Mikey College, NP  folic acid (FOLVITE) 1 MG tablet Take 1 mg by mouth daily.   Yes [provider]  gabapentin (NEURONTIN) 100 MG capsule Take 1 tablet (100mg ) twice daily as needed then take 3 tablets (300mg ) at bedtime. Patient taking differently: Take 400 mg by mouth daily. 100mg  morning 300mg  at bedtime 07/19/17  Yes Mikey College, NP  hydroxychloroquine (PLAQUENIL) 200 MG tablet Take 300 mg by mouth daily.    Yes [provider]   levothyroxine (SYNTHROID, LEVOTHROID) 25 MCG tablet Take 1 tablet (25 mcg total) by mouth daily before breakfast. 10/14/17  Yes Mikey College, NP  lisinopril (PRINIVIL,ZESTRIL) 20 MG tablet Take 20 mg by mouth daily.   Yes [provider]  methotrexate (RHEUMATREX) 2.5 MG tablet Take 12.5 mg by mouth once a week. Caution:Chemotherapy. Protect from light.    Yes [provider]  omeprazole (PRILOSEC) 20 MG capsule Take 20 mg by mouth daily.   Yes [provider]  traZODone (DESYREL) 50 MG tablet Take 50 mg by mouth at bedtime.   Yes [provider]  albuterol (PROVENTIL HFA;VENTOLIN HFA) 108 (90 Base) MCG/ACT inhaler Inhale 1-2 puffs into the lungs every 6 (six) hours as needed for wheezing or shortness of breath.    [provider]  cromolyn (NASALCROM) 5.2 MG/ACT nasal spray Place 1 spray into both nostrils as needed for allergies.    [provider]  fluticasone (FLONASE) 50 MCG/ACT nasal spray Place 2 sprays into both nostrils daily.    [provider]   Dg Chest 1 View  Result Date: 12/18/2017 CLINICAL DATA:  Known left hip fracture EXAM: CHEST 1 VIEW COMPARISON:  None. FINDINGS: Cardiac shadows within normal limits. Aortic calcifications are noted. There is a 15 mm nodular density noted adjacent to the left hilum too large to represent a vessel on and. This is suspicious for an underlying pulmonary mass. CT of the chest with contrast is recommended when the patient is clinically able. No other focal abnormality is noted. IMPRESSION: Left midlung nodule as described. CT of the chest is recommended when clinically able. Electronically Signed   By: Inez Catalina M.D.   On: 12/18/2017 11:30   Dg Hip Unilat W Or W/o Pelvis 2-3 Views Left  Result Date: 12/18/2017 CLINICAL DATA:  Recent fall while walking dog with left hip pain, initial encounter EXAM: DG HIP (WITH OR WITHOUT PELVIS) 3V LEFT COMPARISON:  None. FINDINGS: There are  changes consistent with a subcapital femoral neck fracture with impaction and angulation at the fracture site. The pelvic ring appears intact. No other focal abnormality is noted. IMPRESSION: Subcapital left femoral neck fracture Electronically Signed   By: Inez Catalina M.D.   On: 12/18/2017 11:29    Positive ROS: All other systems have been reviewed and were otherwise negative with the exception of those mentioned in the HPI and as above.  Physical Exam: General:  Alert, no acute distress Psychiatric:  Patient is competent for consent with normal mood and affect   Cardiovascular:  No pedal edema Respiratory:  No wheezing, non-labored breathing GI:  Abdomen is soft and non-tender Skin:  No lesions in the area of chief complaint Neurologic:  Sensation intact distally Lymphatic:  No axillary or cervical lymphadenopathy  Orthopedic Exam:  Orthopedic examination is limited to the left hip and lower extremity.  The patient's left lower extremity is  somewhat shortened and externally rotated as compared to the right.  Skin inspection around the left hip is unremarkable.  There is no swelling, erythema, ecchymosis, abrasions, or other skin abnormalities.  She has mild/moderate tenderness to palpation over the lateral aspect of the left hip.  She has more moderate pain with any attempted active or passive motion of the hip.  She is neurovascularly intact to the left lower extremity and foot.  X-rays:  X-rays of the pelvis and left hip are available for review.  These x-rays demonstrate a varus angulated displaced left femoral neck fracture.  The hip joint itself appears to be well maintained and without evidence for significant degenerative changes.  No lytic lesions are identified.  Assessment: Closed displaced left femoral neck fracture.  Plan: Treatment options are discussed with the patient, including both surgical nonsurgical options.  The patient would like to proceed with surgical intervention  to include a left hip hemiarthroplasty.  This procedure has been discussed in detail, as have the potential risks (including bleeding, infection, nerve and blood vessel injury, persistent or recurrent pain, stiffness, leg length inequality, dislocation, loosening of and/or failure of the components, need for further surgery, blood clots, strokes, heart attacks and arrhythmias, etc.) and benefits.  The patient states her understanding and wishes to proceed.  A formal written consent will be obtained by the nursing staff.  Thank you for asked me to participate in the care of this most pleasant woman.  I will be happy to follow her with you.    Pascal Lux, MD  Beeper #:  [phone]  12/18/2017 4:18 PM

## 2017-12-18 NOTE — H&P (Addendum)
[facility] NAME: Alexis Owens    MR#:  [id number]  DATE OF BIRTH:  May 10, 1941  DATE OF ADMISSION:  12/18/2017  PRIMARY CARE PHYSICIAN: Mikey College, NP   REQUESTING/REFERRING PHYSICIAN:   CHIEF COMPLAINT:   Chief Complaint  Patient presents with  . Fall  . Hip Pain    HISTORY OF PRESENT ILLNESS: Alexis Owens  is a 77 y.o. female with a known history per below, paroxysmal A. fib status post ablation-not on oral anticoagulation/noncompliant with diltiazem by choice, chronic tobacco smoking abuse/dependency, chronic benign lung nodule presenting status post fall, tripping over dog, resulting in left hip pain, ER workup noted for left femoral neck fracture, patient evaluated in the emergency room, daughter is at the bedside, patient is now being admitted for acute left femoral neck fracture.  PAST MEDICAL HISTORY:   Past Medical History:  Diagnosis Date  . Allergy   . Arthritis   . Bunion   . Chronic sinusitis   . High risk medication use   . Hyperlipidemia   . Hypertension   . Hypothyroidism   . Insomnia   . Macular degeneration   . Osteoporosis   . Paroxysmal A-fib ([place])   . Prediabetes   . Rheumatoid arthritis ([place])     PAST SURGICAL HISTORY:  Past Surgical History:  Procedure Laterality Date  . APPENDECTOMY    . LEG SURGERY     Rt leg surgery for Compound fracture of the Right tibia     SOCIAL HISTORY:  Social History   Tobacco Use  . Smoking status: Former Smoker    Last attempt to quit: 07/19/2014    Years since quitting: 3.4  . Smokeless tobacco: Never Used  Substance Use Topics  . Alcohol use: Yes    FAMILY HISTORY:  Family History  Problem Relation Age of Onset  . Breast cancer Sister   . Heart disease Brother   . Diabetes Brother     DRUG ALLERGIES:  Allergies  Allergen Reactions  . Penicillin G Hives  . Penicillins Hives    Has patient had a PCN reaction causing immediate  rash, facial/tongue/throat swelling, SOB or lightheadedness with hypotension: Yes Has patient had a PCN reaction causing severe rash involving mucus membranes or skin necrosis: No Has patient had a PCN reaction that required hospitalization: No Has patient had a PCN reaction occurring within the last 10 years: No If all of the above answers are "NO", then may proceed with Cephalosporin use.  . Sulfa Antibiotics Other (See Comments)    Mouth sores Other reaction(s): Other (See Comments) Mouth sores  . Sulfasalazine     Other reaction(s): Other (See Comments) Mouth sores    REVIEW OF SYSTEMS:   CONSTITUTIONAL: No fever, fatigue or weakness.  EYES: No blurred or double vision.  EARS, NOSE, AND THROAT: No tinnitus or ear pain.  RESPIRATORY: No cough, shortness of breath, wheezing or hemoptysis.  CARDIOVASCULAR: No chest pain, orthopnea, edema.  GASTROINTESTINAL: No nausea, vomiting, diarrhea or abdominal pain.  GENITOURINARY: No dysuria, hematuria.  ENDOCRINE: No polyuria, nocturia,  HEMATOLOGY: No anemia, easy bruising or bleeding SKIN: No rash or lesion. MUSCULOSKELETAL: Left hip pain   NEUROLOGIC: No tingling, numbness, weakness.  PSYCHIATRY: No anxiety or depression.   MEDICATIONS AT HOME:  Prior to Admission medications   Medication Sig Start Date End Date Taking? Authorizing Provider  alendronate (FOSAMAX) 70 MG tablet TAKE 1 TABLET BY MOUTH ONCE A WEEK, WITH  A FULL GLASS OF WATER ON AN EMPTY STOMACH, REMAIN UPRIGHT FOR 30 MINUTES 11/11/17  Yes Mikey College, NP  diltiazem (DILACOR XR) 180 MG 24 hr capsule Take 1 capsule (180 mg total) by mouth daily. 10/14/17  Yes Mikey College, NP  folic acid (FOLVITE) 1 MG tablet Take 1 mg by mouth daily.   Yes [provider]  gabapentin (NEURONTIN) 100 MG capsule Take 1 tablet (100mg ) twice daily as needed then take 3 tablets (300mg ) at bedtime. Patient taking differently: Take 400 mg by mouth daily. 100mg   morning 300mg  at bedtime 07/19/17  Yes Mikey College, NP  hydroxychloroquine (PLAQUENIL) 200 MG tablet Take 300 mg by mouth daily.    Yes [provider]  levothyroxine (SYNTHROID, LEVOTHROID) 25 MCG tablet Take 1 tablet (25 mcg total) by mouth daily before breakfast. 10/14/17  Yes Mikey College, NP  lisinopril (PRINIVIL,ZESTRIL) 20 MG tablet Take 20 mg by mouth daily.   Yes [provider]  methotrexate (RHEUMATREX) 2.5 MG tablet Take 12.5 mg by mouth once a week. Caution:Chemotherapy. Protect from light.    Yes [provider]  omeprazole (PRILOSEC) 20 MG capsule Take 20 mg by mouth daily.   Yes [provider]  traZODone (DESYREL) 50 MG tablet Take 50 mg by mouth at bedtime.   Yes [provider]  albuterol (PROVENTIL HFA;VENTOLIN HFA) 108 (90 Base) MCG/ACT inhaler Inhale 1-2 puffs into the lungs every 6 (six) hours as needed for wheezing or shortness of breath.    [provider]  cromolyn (NASALCROM) 5.2 MG/ACT nasal spray Place 1 spray into both nostrils as needed for allergies.    [provider]  fluticasone (FLONASE) 50 MCG/ACT nasal spray Place 2 sprays into both nostrils daily.    [provider]      PHYSICAL EXAMINATION:   VITAL SIGNS: Blood pressure 121/73, pulse 75, temperature 98 F (36.7 C), temperature source Oral, resp. rate 16, height 5\' 2"  (1.575 m), weight 49 kg (108 lb), SpO2 99 %.  GENERAL:  77 y.o.-year-old patient lying in the bed with no acute distress.  EYES: Pupils equal, round, reactive to light and accommodation. No scleral icterus. Extraocular muscles intact.  HEENT: Head atraumatic, normocephalic. Oropharynx and nasopharynx clear.  NECK:  Supple, no jugular venous distention. No thyroid enlargement, no tenderness.  LUNGS: Normal breath sounds bilaterally, no wheezing, rales,rhonchi or crepitation. No use of accessory muscles of respiration.  CARDIOVASCULAR: S1, S2 normal. No  murmurs, rubs, or gallops.  ABDOMEN: Soft, nontender, nondistended. Bowel sounds present. No organomegaly or mass.  EXTREMITIES: No pedal edema, cyanosis, or clubbing.  Left hip tenderness on palpation, decreased range of motion left lower extremity NEUROLOGIC: Cranial nerves II through XII are intact. MAES. Gait not checked.  PSYCHIATRIC: The patient is alert and oriented x 3.  SKIN: No obvious rash, lesion, or ulcer.   LABORATORY PANEL:   CBC Recent Labs  Lab 12/18/17 1050  WBC 10.8  HGB 12.7  HCT 37.3  PLT 170  MCV 101.0*  MCH 34.5*  MCHC 34.1  RDW 13.3  LYMPHSABS 1.6  MONOABS 0.7  EOSABS 0.1  BASOSABS 0.1   ------------------------------------------------------------------------------------------------------------------  Chemistries  Recent Labs  Lab 12/18/17 1050  NA 137  K 4.5  CL 105  CO2 23  GLUCOSE 99  BUN 17  CREATININE 0.69  CALCIUM 9.2  AST 34  ALT 19  ALKPHOS 45  BILITOT 0.6   ------------------------------------------------------------------------------------------------------------------ estimated creatinine clearance is 46.3 mL/min (by  C-G formula based on SCr of 0.69 mg/dL). ------------------------------------------------------------------------------------------------------------------ No results for input(s): TSH, T4TOTAL, T3FREE, THYROIDAB in the last 72 hours.  Invalid input(s): FREET3   Coagulation profile No results for input(s): INR, PROTIME in the last 168 hours. ------------------------------------------------------------------------------------------------------------------- No results for input(s): DDIMER in the last 72 hours. -------------------------------------------------------------------------------------------------------------------  Cardiac Enzymes Recent Labs  Lab 12/18/17 1050  TROPONINI <0.03    ------------------------------------------------------------------------------------------------------------------ Invalid input(s): POCBNP  ---------------------------------------------------------------------------------------------------------------  Urinalysis No results found for: COLORURINE, APPEARANCEUR, LABSPEC, PHURINE, GLUCOSEU, HGBUR, BILIRUBINUR, KETONESUR, PROTEINUR, UROBILINOGEN, NITRITE, LEUKOCYTESUR   RADIOLOGY: Dg Chest 1 View  Result Date: 12/18/2017 CLINICAL DATA:  Known left hip fracture EXAM: CHEST 1 VIEW COMPARISON:  None. FINDINGS: Cardiac shadows within normal limits. Aortic calcifications are noted. There is a 15 mm nodular density noted adjacent to the left hilum too large to represent a vessel on and. This is suspicious for an underlying pulmonary mass. CT of the chest with contrast is recommended when the patient is clinically able. No other focal abnormality is noted. IMPRESSION: Left midlung nodule as described. CT of the chest is recommended when clinically able. Electronically Signed   By: Inez Catalina M.D.   On: 12/18/2017 11:30   Dg Hip Unilat W Or W/o Pelvis 2-3 Views Left  Result Date: 12/18/2017 CLINICAL DATA:  Recent fall while walking dog with left hip pain, initial encounter EXAM: DG HIP (WITH OR WITHOUT PELVIS) 3V LEFT COMPARISON:  None. FINDINGS: There are changes consistent with a subcapital femoral neck fracture with impaction and angulation at the fracture site. The pelvic ring appears intact. No other focal abnormality is noted. IMPRESSION: Subcapital left femoral neck fracture Electronically Signed   By: Inez Catalina M.D.   On: 12/18/2017 11:29    EKG: Orders placed or performed during the hospital encounter of 12/18/17  . ED EKG  . ED EKG  . EKG 12-Lead  . EKG 12-Lead    IMPRESSION AND PLAN: 1 acute left femoral neck fracture Admit to regular nursing floor bed, orthopedic surgery to evaluate, morphine PCA for pain Patient is cleared for  surgery-at risk for acute cardiac death/acute cardiac arrest/acute stroke less than 2%  2 acute left cephalhematoma Status post fall Conservative management  3 chronic hypothyroidism, unspecified Stable Continue home regiment  4 chronic benign essential hypertension Stable Continue home regiment  5 chronic tobacco smoking abuse/dependency Nicotine patch and cessation counseling ordered  6 diet controlled diabetes mellitus type 2 Sliding scale insulin with Accu-Cheks per routine for now, check hemoglobin A1c to determine level control  7 chronic paroxysmal A. Fib Status post ablation  Stable -in normal sinus rhythm Not on oral anticoagulation by choice/noncompliant with diltiazem Continue diltiazem  Full code Condition stable Prognosis fair Disposition to inpatient rehab in 3-5 days pending clinical course   All the records are reviewed and case discussed with ED provider. Management plans discussed with the patient, family and they are in agreement.  CODE STATUS: Code Status History    This patient does not have a recorded code status. Please follow your organizational policy for patients in this situation.       TOTAL TIME TAKING CARE OF THIS PATIENT: 45 minutes.    Avel Peace Salary M.D on 12/18/2017   Between 7am to 6pm - Pager - [phone]  After 6pm go to [email or web address] - password [email or web address] [facility]  Office  [phone]  CC: Primary care physician; Mikey College, NP   Note: This dictation was prepared with Viviann Spare  dictation along with smaller phrase technology. Any transcriptional errors that result from this process are unintentional.

## 2017-12-18 NOTE — ED Provider Notes (Signed)
[facility] Emergency Department Provider Note       Time seen: ----------------------------------------- 11:14 AM on 12/18/2017 -----------------------------------------   I have reviewed the triage vital signs and the nursing notes.  HISTORY   Chief Complaint Fall and Hip Pain    HPI Alexis Owens is a 77 y.o. female with a history of hyperlipidemia, hypothyroidism, paroxysmal atrial fibrillation and hypertension who presents to the ED for a fall.  Patient comes the ER by EMS from home complaining of her dog knocking her down off the porch and causing severe pain to the left hip.  There was shortening and internal rotation noted.  She was noted to have a hematoma also to the back of her head.  She denies any other complaints at this time.  Past Medical History:  Diagnosis Date  . Allergy   . Arthritis   . Bunion   . Chronic sinusitis   . High risk medication use   . Hyperlipidemia   . Hypertension   . Hypothyroidism   . Insomnia   . Macular degeneration   . Osteoporosis   . Paroxysmal A-fib (Alexis)   . Prediabetes   . Rheumatoid arthritis [facility])     Patient Active Problem List   Diagnosis Date Noted  . Hypertension 07/24/2017  . Paroxysmal A-fib ([place]) 07/24/2017  . Hypothyroidism 07/24/2017  . Rheumatoid arthritis ([place]) 07/24/2017  . High risk medication use 07/24/2017  . Prediabetes 07/24/2017  . Insomnia 07/24/2017  . Macular degeneration 07/24/2017  . Hyperlipidemia 07/24/2017  . Allergic rhinitis 07/24/2017  . Osteoporosis 07/24/2017  . Neuropathic pain 07/24/2017  . Other chronic pain 07/24/2017    Past Surgical History:  Procedure Laterality Date  . APPENDECTOMY    . LEG SURGERY     Rt leg surgery for Compound fracture of the Right tibia     Allergies Penicillin g; Penicillins; Sulfa antibiotics; and Sulfasalazine  Social History Social History   Tobacco Use  . Smoking status: Former Smoker    Last attempt to quit:  07/19/2014    Years since quitting: 3.4  . Smokeless tobacco: Never Used  Substance Use Topics  . Alcohol use: Yes  . Drug use: No    Review of Systems Constitutional: Negative for fever. Cardiovascular: Negative for chest pain. Respiratory: Negative for shortness of breath. Gastrointestinal: Negative for abdominal pain, vomiting and diarrhea. Musculoskeletal: Positive for left hip pain Skin: Positive for scalp contusion Neurological: Negative for headaches, focal weakness or numbness.  All systems negative/normal/unremarkable except as stated in the HPI  ____________________________________________   PHYSICAL EXAM:  VITAL SIGNS: ED Triage Vitals  Enc Vitals Group     BP --      Pulse Rate 12/18/17 1049 70     Resp 12/18/17 1049 15     Temp 12/18/17 1049 98 F (36.7 C)     Temp Source 12/18/17 1049 Oral     SpO2 12/18/17 1049 99 %     Weight 12/18/17 1050 108 lb (49 kg)     Height 12/18/17 1050 5\' 2"  (1.575 m)     Head Circumference --      Peak Flow --      Pain Score 12/18/17 1050 10     Pain Loc --      Pain Edu? --      Excl. in [place]? --     Constitutional: Alert and oriented.  Mild to moderate distress from pain Eyes: Conjunctivae are normal. Normal extraocular movements. ENT  Head: Normocephalic, small left parietal scalp contusion   Nose: No congestion/rhinnorhea.   Mouth/Throat: Mucous membranes are moist.   Neck: No stridor. Cardiovascular: Normal rate, regular rhythm. No murmurs, rubs, or gallops. Respiratory: Normal respiratory effort without tachypnea nor retractions. Breath sounds are clear and equal bilaterally. No wheezes/rales/rhonchi. Gastrointestinal: Soft and nontender. Normal bowel sounds Musculoskeletal: Severe pain with any range of motion of the left hip.  There is shortening and internal rotation Neurologic:  Normal speech and language. No gross focal neurologic deficits are appreciated.  Skin:  Skin is warm, dry and intact.   Scalp hematoma Psychiatric: Mood and affect are normal. Speech and behavior are normal.  ____________________________________________  EKG: Interpreted by me.  Sinus rhythm rate 69 bpm, normal PR interval, normal QRS, normal QT.  ____________________________________________  ED COURSE:  As part of my medical decision making, I reviewed the following data within the [place] History obtained from family if available, nursing notes, old chart and ekg, as well as notes from prior ED visits. Patient presented for a fall and likely hip fracture, we will assess with labs and imaging as indicated at this time.   Procedures ____________________________________________   LABS (pertinent positives/negatives)  Labs Reviewed  CBC WITH DIFFERENTIAL/PLATELET - Abnormal; Notable for the following components:      Result Value   RBC 3.69 (*)    MCV 101.0 (*)    MCH 34.5 (*)    Neutro Abs 8.4 (*)    All other components within normal limits  COMPREHENSIVE METABOLIC PANEL  TROPONIN I  URINALYSIS, COMPLETE (UACMP) WITH MICROSCOPIC    RADIOLOGY Images were viewed by me  Left hip x-rays IMPRESSION: Left midlung nodule as described. CT of the chest is recommended when clinically able. IMPRESSION: Subcapital left femoral neck fracture ____________________________________________  DIFFERENTIAL DIAGNOSIS   Fracture, contusion, abrasion, dislocation, subdural hematoma, occult infection  FINAL ASSESSMENT AND PLAN  Left hip fracture   Plan: Patient had presented for a fall. Patient's labs are grossly within normal limits. Patient's imaging did reveal a subcapital left femoral neck fracture.  She is still in considerable pain and we are treating with IV Dilaudid.  I will discussed with orthopedics and the hospitalist for admission.   Laurence Aly, MD   Note: This note was generated in part or whole with voice recognition software. Voice recognition is usually  quite accurate but there are transcription errors that can and very often do occur. I apologize for any typographical errors that were not detected and corrected.     Earleen [place], MD 12/18/17 [phone]

## 2017-12-18 NOTE — Anesthesia Procedure Notes (Signed)
Spinal  Patient location during procedure: OR Start time: 12/18/2017 8:32 PM End time: 12/18/2017 8:45 PM Staffing Anesthesiologist: Martha Clan, MD Performed: anesthesiologist  Preanesthetic Checklist Completed: patient identified, site marked, surgical consent, pre-op evaluation, timeout performed, IV checked, risks and benefits discussed and monitors and equipment checked Spinal Block Patient position: right lateral decubitus Prep: ChloraPrep Patient monitoring: heart rate, continuous pulse ox and blood pressure Approach: midline Location: L3-4 Injection technique: single-shot Needle Needle type: Quincke  Needle gauge: 22 G Needle length: 10 cm Assessment Sensory level: T10 Additional Notes Patient identified prior to spinal placement, positioned, and sterilely draped prior to procedure start.  No paresthesias noted.  Clear CSF flow prior to spinal administration

## 2017-12-18 NOTE — Transfer of Care (Signed)
Immediate Anesthesia Transfer of Care Note  Patient: Alexis Owens  Procedure(s) Performed: ARTHROPLASTY BIPOLAR HIP (HEMIARTHROPLASTY) (Left Hip)  Patient Location: PACU  Anesthesia Type:Spinal  Level of Consciousness: awake, alert , oriented and patient cooperative  Airway & Oxygen Therapy: Patient Spontanous Breathing  Post-op Assessment: Report given to RN and Post -op Vital signs reviewed and stable  Post vital signs: Reviewed and stable  Last Vitals:  Vitals:   12/18/17 1230 12/18/17 1356  BP: 134/60 98/74  Pulse: 77 72  Resp: 16 18  Temp:  36.8 C  SpO2: 97% 97%    Last Pain:  Vitals:   12/18/17 1700  TempSrc:   PainSc: 9       Patients Stated Pain Goal: 2 (93/23/55 7322)  Complications: No apparent anesthesia complications

## 2017-12-18 NOTE — Anesthesia Preprocedure Evaluation (Signed)
Anesthesia Evaluation  Patient identified by MRN, date of birth, ID band Patient awake    Reviewed: Allergy & Precautions, H&P , NPO status , Patient's Chart, lab work & pertinent test results, reviewed documented beta blocker date and time   History of Anesthesia Complications Negative for: history of anesthetic complications  Airway Mallampati: I  TM Distance: >3 FB Neck ROM: full    Dental  (+) Caps, Dental Advidsory Given, Missing   Pulmonary neg pulmonary ROS, former smoker,           Cardiovascular Exercise Tolerance: Good hypertension, (-) angina(-) CAD, (-) Past MI, (-) Cardiac Stents and (-) CABG + dysrhythmias (s/p ablation) Atrial Fibrillation + Valvular Problems/Murmurs      Neuro/Psych negative neurological ROS  negative psych ROS   GI/Hepatic negative GI ROS, Neg liver ROS,   Endo/Other  diabetes (borderline)Hypothyroidism   Renal/GU negative Renal ROS  negative genitourinary   Musculoskeletal   Abdominal   Peds  Hematology negative hematology ROS (+)   Anesthesia Other Findings Past Medical History: No date: Allergy No date: Arthritis No date: Bunion No date: Chronic sinusitis No date: High risk medication use No date: Hyperlipidemia No date: Hypertension No date: Hypothyroidism No date: Insomnia No date: Macular degeneration No date: Osteoporosis No date: Paroxysmal A-fib (HCC) No date: Prediabetes No date: Rheumatoid arthritis (HCC)   Reproductive/Obstetrics negative OB ROS                             Anesthesia Physical Anesthesia Plan  ASA: III  Anesthesia Plan: Spinal   Post-op Pain Management:    Induction:   PONV Risk Score and Plan: 3 and Propofol infusion  Airway Management Planned:   Additional Equipment:   Intra-op Plan:   Post-operative Plan:   Informed Consent: I have reviewed the patients History and Physical, chart, labs and  discussed the procedure including the risks, benefits and alternatives for the proposed anesthesia with the patient or authorized representative who has indicated his/her understanding and acceptance.   Dental Advisory Given  Plan Discussed with: Anesthesiologist, CRNA and Surgeon  Anesthesia Plan Comments:         Anesthesia Quick Evaluation

## 2017-12-18 NOTE — Plan of Care (Signed)
  Progressing Education: Knowledge of General Education information will improve 12/18/2017 1849 - Progressing by Milderd Meager, RN Health Behavior/Discharge Planning: Ability to manage health-related needs will improve 12/18/2017 1849 - Progressing by Milderd Meager, RN Clinical Measurements: Ability to maintain clinical measurements within normal limits will improve 12/18/2017 1849 - Progressing by Milderd Meager, RN Will remain free from infection 12/18/2017 1849 - Progressing by Milderd Meager, RN Diagnostic test results will improve 12/18/2017 1849 - Progressing by Milderd Meager, RN Respiratory complications will improve 12/18/2017 1849 - Progressing by Milderd Meager, RN Cardiovascular complication will be avoided 12/18/2017 1849 - Progressing by Milderd Meager, RN Activity: Risk for activity intolerance will decrease 12/18/2017 1849 - Progressing by Milderd Meager, RN Nutrition: Adequate nutrition will be maintained 12/18/2017 1849 - Progressing by Milderd Meager, RN Coping: Level of anxiety will decrease 12/18/2017 1849 - Progressing by Milderd Meager, RN Elimination: Will not experience complications related to bowel motility 12/18/2017 1849 - Progressing by Milderd Meager, RN Will not experience complications related to urinary retention 12/18/2017 1849 - Progressing by Milderd Meager, RN Pain Managment: General experience of comfort will improve 12/18/2017 1849 - Progressing by Milderd Meager, RN Safety: Ability to remain free from injury will improve 12/18/2017 1849 - Progressing by Milderd Meager, RN Skin Integrity: Risk for impaired skin integrity will decrease 12/18/2017 1849 - Progressing by Milderd Meager, RN

## 2017-12-18 NOTE — ED Triage Notes (Signed)
Pt comes into the ED via EMS from home with c/o her large dog knocking her down off the porch and causing her to injury her left hip with shortening and rotation noted. Pt does have a hematoma to the back of the left side of head. Pt is a/ox 4 on arrival..

## 2017-12-19 ENCOUNTER — Encounter: Payer: Self-pay | Admitting: Surgery

## 2017-12-19 LAB — GLUCOSE, CAPILLARY
GLUCOSE-CAPILLARY: 110 mg/dL — AB (ref 65–99)
GLUCOSE-CAPILLARY: 106 mg/dL — AB (ref 65–99)

## 2017-12-19 LAB — BASIC METABOLIC PANEL
Anion gap: 6 (ref 5–15)
BUN: 12 mg/dL (ref 6–20)
CHLORIDE: 108 mmol/L (ref 101–111)
CO2: 26 mmol/L (ref 22–32)
Calcium: 8.4 mg/dL — ABNORMAL LOW (ref 8.9–10.3)
Creatinine, Ser: 0.76 mg/dL (ref 0.44–1.00)
GFR calc non Af Amer: >60 mL/min (ref >60)
Glucose, Bld: 113 mg/dL — ABNORMAL HIGH (ref 65–99)
POTASSIUM: 5 mmol/L (ref 3.5–5.1)
SODIUM: 140 mmol/L (ref 135–145)

## 2017-12-19 LAB — CBC
HCT: 33.1 % — ABNORMAL LOW (ref 35.0–47.0)
HEMOGLOBIN: 11.3 g/dL — AB (ref 12.0–16.0)
MCH: 34.7 pg — ABNORMAL HIGH (ref 26.0–34.0)
MCHC: 34.1 g/dL (ref 32.0–36.0)
MCV: 102 fL — ABNORMAL HIGH (ref 80.0–100.0)
Platelets: 142 10*3/uL — ABNORMAL LOW (ref 150–440)
RBC: 3.24 MIL/uL — ABNORMAL LOW (ref 3.80–5.20)
RDW: 13.5 % (ref 11.5–14.5)
WBC: 8.1 10*3/uL (ref 3.6–11.0)

## 2017-12-19 MED ORDER — GABAPENTIN 300 MG PO CAPS
300.0000 mg | ORAL_CAPSULE | Freq: Every day | ORAL | Status: DC
  Filled 2017-12-19: qty 1

## 2017-12-19 MED ORDER — GABAPENTIN 100 MG PO CAPS
100.0000 mg | ORAL_CAPSULE | Freq: Every morning | ORAL | Status: DC
  Administered 2017-12-20: 100 mg via ORAL
  Filled 2017-12-19: qty 1

## 2017-12-19 NOTE — Progress Notes (Signed)
  Subjective: 1 Day Post-Op Procedure(s) (LRB): ARTHROPLASTY BIPOLAR HIP (HEMIARTHROPLASTY) (Left) Patient reports pain as moderate.   Patient is well, and has had no acute complaints or problems PT and care management to assist with discharge planning. Negative for chest pain and shortness of breath Fever: no Gastrointestinal:Negative for nausea and vomiting  Objective: Vital signs in last 24 hours: Temp:  [98.3 F (36.8 C)-99 F (37.2 C)] 99 F (37.2 C) (02/07 1127) Pulse Rate:  [65-109] 109 (02/07 1127) Resp:  [12-20] 20 (02/07 1127) BP: (91-137)/(50-74) 116/67 (02/07 1127) SpO2:  [88 %-100 %] 97 % (02/07 1327) FiO2 (%):  [2 %] 2 % (02/07 0230)  Intake/Output from previous day:  Intake/Output Summary (Last 24 hours) at 12/19/2017 1336 Last data filed at 12/19/2017 0657 Gross per 24 hour  Intake 1332.5 ml  Output 1300 ml  Net 32.5 ml    Intake/Output this shift: No intake/output data recorded.  Labs: Recent Labs    12/18/17 1050 12/19/17 0605  HGB 12.7 11.3*   Recent Labs    12/18/17 1050 12/19/17 0605  WBC 10.8 8.1  RBC 3.69* 3.24*  HCT 37.3 33.1*  PLT 170 142*   Recent Labs    12/18/17 1050 12/19/17 0605  NA 137 140  K 4.5 5.0  CL 105 108  CO2 23 26  BUN 17 12  CREATININE 0.69 0.76  GLUCOSE 99 113*  CALCIUM 9.2 8.4*   No results for input(s): LABPT, INR in the last 72 hours.   EXAM General - Patient is Alert, Appropriate and Oriented Extremity - ABD soft Sensation intact distally Intact pulses distally Dorsiflexion/Plantar flexion intact Incision: dressing C/D/I No cellulitis present Dressing/Incision - clean, dry, no drainage Motor Function - intact, moving foot and toes well on exam.   Abdomen soft with normal BS.  Past Medical History:  Diagnosis Date  . Allergy   . Arthritis   . Bunion   . Chronic sinusitis   . High risk medication use   . Hyperlipidemia   . Hypertension   . Hypothyroidism   . Insomnia   . Macular  degeneration   . Osteoporosis   . Paroxysmal A-fib ([place])   . Prediabetes   . Rheumatoid arthritis (HCC)     Assessment/Plan: 1 Day Post-Op Procedure(s) (LRB): ARTHROPLASTY BIPOLAR HIP (HEMIARTHROPLASTY) (Left) Active Problems:   Hip fx (HCC)  Estimated body mass index is 19.75 kg/m as calculated from the following:   Height as of this encounter: 5\' 2"  (1.575 m).   Weight as of this encounter: 49 kg (108 lb). Advance diet Up with therapy D/C IV fluids when tolerating po intake.  Labs reviewed today, CBC and BMP ordered for tomorrow. Foley removed today. Work with PT, currently plan is for discharge home with HHPT.  DVT Prophylaxis - Lovenox, Foot Pumps and TED hose Weight-Bearing as tolerated to left leg  J. Cameron Proud, PA-C [facility] Orthopaedic Surgery 12/19/2017, 1:36 PM

## 2017-12-19 NOTE — Evaluation (Signed)
Physical Therapy Evaluation Patient Details Name: Alexis Owens MRN: [id number] DOB: 1941/09/15 Today's Date: 12/19/2017   History of Present Illness  77 y/o who fell and suffered L hip fx, had L hip hemiarthroplasty 12/18/17.  Clinical Impression  Pt showed great effort with PT exam and was eager to do what she could to show she could go home.  She did vomit multiple times during the session (nursing notified) but generally she did not let this stop her and she was able to participate with ~15 minutes of exercises apart from the exam as well as tolerate significant gait training with the walker in the hallway in addition to actual exam.  Pt understands precautions, will reinforce this afternoon.    Follow Up Recommendations Home health PT    Equipment Recommendations  Rolling walker with 5" wheels    Recommendations for Other Services       Precautions / Restrictions Precautions Precautions: Fall Restrictions Weight Bearing Restrictions: Yes LLE Weight Bearing: Weight bearing as tolerated      Mobility  Bed Mobility Overal bed mobility: Modified Independent             General bed mobility comments: Pt was able to slowly but safely get herself to sitting EOB, she was minimally pain limited but ultimately did well showing great effort  Transfers Overall transfer level: Modified independent Equipment used: Rolling walker (2 wheeled)             General transfer comment: Heavy cuing to insure precautions and generally showed good effort and with encouragement did not need direct phyiscal assist to rise  Ambulation/Gait Ambulation/Gait assistance: Min guard Ambulation Distance (Feet): 60 Feet Assistive device: Rolling walker (2 wheeled)       General Gait Details: Pt with slow and guarded ambulation but was able to tolerate weight through L and had no safety issues.  She was heavily reliant on the walker to distrubute weight but overall did well with O2 remaining in  the 90s.  Stairs            Wheelchair Mobility    Modified Rankin (Stroke Patients Only)       Balance Overall balance assessment: Modified Independent                                           Pertinent Vitals/Pain Pain Assessment: 0-10 Pain Score: 6     Home Living Family/patient expects to be discharged to:: Private residence Living Arrangements: Spouse/significant other Available Help at Discharge: Family   Home Access: Stairs to enter Entrance Stairs-Rails: None Entrance Stairs-Number of Steps: 1          Prior Function Level of Independence: Independent         Comments: Pt able to be active and do all she needs (drive, errands, gardening, etc)     Hand Dominance        Extremity/Trunk Assessment   Upper Extremity Assessment Upper Extremity Assessment: Overall WFL for tasks assessed;Generalized weakness    Lower Extremity Assessment Lower Extremity Assessment: Generalized weakness(expected L sided weakness post-op)       Communication   Communication: No difficulties  Cognition Arousal/Alertness: Awake/alert Behavior During Therapy: WFL for tasks assessed/performed Overall Cognitive Status: Within Functional Limits for tasks assessed  General Comments      Exercises Total Joint Exercises Ankle Circles/Pumps: Strengthening;10 reps Quad Sets: Strengthening;10 reps Gluteal Sets: Strengthening;10 reps Heel Slides: Strengthening;5 reps Hip ABduction/ADduction: Strengthening;10 reps Straight Leg Raises: AROM;10 reps   Assessment/Plan    PT Assessment Patient needs continued PT services  PT Problem List Decreased strength;Decreased range of motion;Decreased activity tolerance;Decreased mobility;Decreased balance;Decreased coordination;Decreased knowledge of use of DME;Decreased safety awareness;Decreased knowledge of precautions;Pain       PT Treatment  Interventions DME instruction;Gait training;Functional mobility training;Stair training;Therapeutic activities;Therapeutic exercise;Balance training;Patient/family education    PT Goals (Current goals can be found in the Care Plan section)  Acute Rehab PT Goals Patient Stated Goal: go home PT Goal Formulation: With patient Time For Goal Achievement: 01/02/18 Potential to Achieve Goals: Good    Frequency BID   Barriers to discharge        Co-evaluation               AM-PAC PT "6 Clicks" Daily Activity  Outcome Measure Difficulty turning over in bed (including adjusting bedclothes, sheets and blankets)?: A Little Difficulty moving from lying on back to sitting on the side of the bed? : A Lot Difficulty sitting down on and standing up from a chair with arms (e.g., wheelchair, bedside commode, etc,.)?: A Lot Help needed moving to and from a bed to chair (including a wheelchair)?: A Little Help needed walking in hospital room?: A Little Help needed climbing 3-5 steps with a railing? : A Lot 6 Click Score: 15    End of Session Equipment Utilized During Treatment: Gait belt Activity Tolerance: Patient tolerated treatment well Patient left: with chair alarm set;with call bell/phone within reach;with family/visitor present   PT Visit Diagnosis: Muscle weakness (generalized) (M62.81);Difficulty in walking, not elsewhere classified (R26.2)    Time: 4142-3953 PT Time Calculation (min) (ACUTE ONLY): 40 min   Charges:   PT Evaluation $PT Eval Low Complexity: 1 Low PT Treatments $Gait Training: 8-22 mins $Therapeutic Exercise: 8-22 mins   PT G Codes:        Kreg Shropshire, DPT 12/19/2017, 12:27 PM

## 2017-12-19 NOTE — Anesthesia Postprocedure Evaluation (Signed)
Anesthesia Post Note  Patient: Alexis Owens  Procedure(s) Performed: ARTHROPLASTY BIPOLAR HIP (HEMIARTHROPLASTY) (Left Hip)  Patient location during evaluation: Nursing Unit Anesthesia Type: Spinal Level of consciousness: oriented and awake and alert Pain management: pain level controlled Vital Signs Assessment: post-procedure vital signs reviewed and stable Respiratory status: spontaneous breathing and respiratory function stable Cardiovascular status: blood pressure returned to baseline and stable Postop Assessment: no headache, no backache, no apparent nausea or vomiting and spinal receding Anesthetic complications: no     Last Vitals:  Vitals:   12/19/17 0305 12/19/17 0752  BP: (!) 114/55 137/73  Pulse: 76 85  Resp: 16 18  Temp: 37.2 C 37.2 C  SpO2: 99% 97%    Last Pain:  Vitals:   12/19/17 0752  TempSrc: Oral  PainSc:                  Brantley Fling

## 2017-12-19 NOTE — Clinical Social Work Note (Addendum)
Clinical Social Work Assessment  Patient Details  Name: Alexis Owens MRN: [id number] Date of Birth: 01/18/1941  Date of referral:  12/19/17               Reason for consult:  Discharge Planning                Permission sought to share information with:    Permission granted to share information::     Name::        Agency::     Relationship::     Contact Information:     Housing/Transportation Living arrangements for the past 2 months:  Single Family Home Source of Information:  Patient Patient Interpreter Needed:  None Criminal Activity/Legal Involvement Pertinent to Current Situation/Hospitalization:  No - Comment as needed Significant Relationships:  Adult Children, Spouse Lives with:  Spouse Do you feel safe going back to the place where you live?  Yes Need for family participation in patient care:  Yes (Comment)  Care giving concerns: Patient lives in [place] with husband/HPOA [name] [phone]).   Social Worker assessment / plan:  Holiday representative (CSW) reviewed patient's chart and noted that she had surgery yesterday. PT is pending. Social work Theatre manager met with patient, husband [name] and daughter [name] [phone]) at bedside. Patient was sitting up alert and oriented x4. Social work Theatre manager introduced self and explained the role of the [facility]. Patient shared she lives in [place] with husband [name]. Social work Theatre manager explained that PT is pending and they might recommend SNF placement and [employer] will have to authorize it. Patient verbalized her understanding and refused SNF placement.   PT is recommending home health. RN case manager aware of above. Please reconsult if future social work needs arise. CSW signing off.   Employment status:  Retired Nurse, adult PT Recommendations:  Not assessed at this time Information / Referral to community resources:     Patient/Family's Response to care: Patient is agreeable to  home health.   Patient/Family's Understanding of and Emotional Response to Diagnosis, Current Treatment, and Prognosis: Patient and family members were pleasant and thanked social work Theatre manager for her assistance.  Emotional Assessment Appearance:  Appears stated age Attitude/Demeanor/Rapport:    Affect (typically observed):  Calm, Pleasant, Accepting Orientation:  Oriented to Self, Oriented to Place, Oriented to  Time, Oriented to Situation Alcohol / Substance use:  Not Applicable Psych involvement (Current and /or in the community):  No (Comment)  Discharge Needs  Concerns to be addressed:  Care Coordination, Discharge Planning Concerns Readmission within the last 30 days:  No Current discharge risk:  Dependent with Mobility Barriers to Discharge:  Continued Medical Work up   Smith Mince, [place] Work 12/19/2017, 8:28 AM

## 2017-12-19 NOTE — Progress Notes (Signed)
Physical Therapy Treatment Patient Details Name: Alexis Owens MRN: [id number] DOB: 1941/10/02 Today's Date: 12/19/2017    History of Present Illness 77 y/o who fell and suffered L hip fx, had L hip hemiarthroplasty 12/18/17.    PT Comments    Pt still feeling somewhat nauseous and generally poorly post surgery but still very eager to participate with PT and willing to push herself.  Pt was able to slowly, but safely ambulate 100 ft, was able to do bed mobility and transfers w/o direct assist (though she needed extensive cuing, explanation and encouragement to perform safely and well).  Pt very fatigued with the effort, but overall did well and had appropriate vitals post session.   Follow Up Recommendations  Home health PT     Equipment Recommendations  Rolling walker with 5" wheels    Recommendations for Other Services       Precautions / Restrictions Precautions Precautions: Fall;Posterior Hip Precaution Booklet Issued: Yes (comment) Restrictions Weight Bearing Restrictions: Yes LLE Weight Bearing: Weight bearing as tolerated    Mobility  Bed Mobility Overal bed mobility: Modified Independent             General bed mobility comments: Pt was able to get back into bed w/o direct assist and showed good effort with slow but labored transition.  Transfers Overall transfer level: Modified independent Equipment used: Rolling walker (2 wheeled)             General transfer comment: Again heavy reminders/cuing for positioing, sequencing and precautions but pt was able to rise w/o direct assist.   Ambulation/Gait Ambulation/Gait assistance: Min guard Ambulation Distance (Feet): 100 Feet Assistive device: Rolling walker (2 wheeled)       General Gait Details: Pt again with heavy reliance on UEs to unweight L LE despite encouragement to try and use it more.  She was unable to tolerate WBing with PT attempting to advance walker consistently.  She did show great effort  t/o the entire bout and despite pain was very motivated to do as much as she could.  Pt had no LOBs or significant safety issues, but overall did well.    Stairs            Wheelchair Mobility    Modified Rankin (Stroke Patients Only)       Balance Overall balance assessment: Modified Independent                                          Cognition Arousal/Alertness: Awake/alert Behavior During Therapy: WFL for tasks assessed/performed Overall Cognitive Status: Within Functional Limits for tasks assessed                                        Exercises Total Joint Exercises Ankle Circles/Pumps: Strengthening;10 reps Quad Sets: Strengthening;10 reps Gluteal Sets: Strengthening;10 reps Short Arc Quad: Strengthening;10 reps Heel Slides: Strengthening;5 reps Hip ABduction/ADduction: Strengthening;10 reps Straight Leg Raises: AROM;10 reps    General Comments        Pertinent Vitals/Pain Pain Assessment: 0-10 Pain Score: 6     Home Living Family/patient expects to be discharged to:: Private residence Living Arrangements: Spouse/significant other Available Help at Discharge: Family   Home Access: Stairs to enter Entrance Stairs-Rails: None          Prior  Function Level of Independence: Independent      Comments: Pt able to be active and do all she needs (drive, errands, gardening, etc)   PT Goals (current goals can now be found in the care plan section) Acute Rehab PT Goals Patient Stated Goal: go home PT Goal Formulation: With patient Time For Goal Achievement: 01/02/18 Potential to Achieve Goals: Good    Frequency    BID      PT Plan Current plan remains appropriate    Co-evaluation              AM-PAC PT "6 Clicks" Daily Activity  Outcome Measure  Difficulty turning over in bed (including adjusting bedclothes, sheets and blankets)?: A Little Difficulty moving from lying on back to sitting on the side  of the bed? : A Lot Difficulty sitting down on and standing up from a chair with arms (e.g., wheelchair, bedside commode, etc,.)?: A Little Help needed moving to and from a bed to chair (including a wheelchair)?: A Little Help needed walking in hospital room?: A Little Help needed climbing 3-5 steps with a railing? : A Lot 6 Click Score: 16    End of Session Equipment Utilized During Treatment: Gait belt Activity Tolerance: Patient tolerated treatment well Patient left: with call bell/phone within reach;with family/visitor present;with bed alarm set   PT Visit Diagnosis: Muscle weakness (generalized) (M62.81);Difficulty in walking, not elsewhere classified (R26.2)     Time: 8280-0349 PT Time Calculation (min) (ACUTE ONLY): 41 min  Charges:  $Gait Training: 8-22 mins $Therapeutic Exercise: 8-22 mins $Therapeutic Activity: 8-22 mins                    G Codes:       Kreg Shropshire, DPT 12/19/2017, 3:17 PM

## 2017-12-19 NOTE — Progress Notes (Signed)
[name] NAME: Alexis Owens    MR#:  [id number]  DATE OF BIRTH:  December 22, 1940  SUBJECTIVE:  Patient came in after a fall when she got ran over by her [place] dog.  She is postop day 1 doing well.  Work with physical therapy.  REVIEW OF SYSTEMS:   Review of Systems  Constitutional: Negative for chills, fever and weight loss.  HENT: Negative for ear discharge, ear pain and nosebleeds.   Eyes: Negative for blurred vision, pain and discharge.  Respiratory: Negative for sputum production, shortness of breath, wheezing and stridor.   Cardiovascular: Negative for chest pain, palpitations, orthopnea and PND.  Gastrointestinal: Negative for abdominal pain, diarrhea, nausea and vomiting.  Genitourinary: Negative for frequency and urgency.  Musculoskeletal: Positive for joint pain. Negative for back pain.  Neurological: Negative for sensory change, speech change, focal weakness and weakness.  Psychiatric/Behavioral: Negative for depression and hallucinations. The patient is not nervous/anxious.    Tolerating Diet: Yes Tolerating PT: Home health PT  DRUG ALLERGIES:   Allergies  Allergen Reactions  . Penicillin G Hives  . Penicillins Hives    Has patient had a PCN reaction causing immediate rash, facial/tongue/throat swelling, SOB or lightheadedness with hypotension: Yes Has patient had a PCN reaction causing severe rash involving mucus membranes or skin necrosis: No Has patient had a PCN reaction that required hospitalization: No Has patient had a PCN reaction occurring within the last 10 years: No If all of the above answers are "NO", then may proceed with Cephalosporin use.  . Sulfa Antibiotics Other (See Comments)    Mouth sores Other reaction(s): Other (See Comments) Mouth sores  . Sulfasalazine     Other reaction(s): Other (See Comments) Mouth sores    VITALS:  Blood pressure (!) 129/53, pulse 85, temperature  99.2 F (37.3 C), temperature source Oral, resp. rate 20, height 5\' 2"  (1.575 m), weight 49 kg (108 lb), SpO2 97 %.  PHYSICAL EXAMINATION:   Physical Exam  GENERAL:  77 y.o.-year-old patient lying in the bed with no acute distress.  EYES: Pupils equal, round, reactive to light and accommodation. No scleral icterus. Extraocular muscles intact.  HEENT: Head atraumatic, normocephalic. Oropharynx and nasopharynx clear.  NECK:  Supple, no jugular venous distention. No thyroid enlargement, no tenderness.  LUNGS: Normal breath sounds bilaterally, no wheezing, rales, rhonchi. No use of accessory muscles of respiration.  CARDIOVASCULAR: S1, S2 normal. No murmurs, rubs, or gallops.  ABDOMEN: Soft, nontender, nondistended. Bowel sounds present. No organomegaly or mass.  EXTREMITIES: No cyanosis, clubbing or edema b/l.   Surgical dressing present NEUROLOGIC: Cranial nerves II through XII are intact. No focal Motor or sensory deficits b/l.   PSYCHIATRIC:  patient is alert and oriented x 3.  SKIN: No obvious rash, lesion, or ulcer.   LABORATORY PANEL:  CBC Recent Labs  Lab 12/19/17 0605  WBC 8.1  HGB 11.3*  HCT 33.1*  PLT 142*    Chemistries  Recent Labs  Lab 12/18/17 1050 12/19/17 0605  NA 137 140  K 4.5 5.0  CL 105 108  CO2 23 26  GLUCOSE 99 113*  BUN 17 12  CREATININE 0.69 0.76  CALCIUM 9.2 8.4*  AST 34  --   ALT 19  --   ALKPHOS 45  --   BILITOT 0.6  --    Cardiac Enzymes Recent Labs  Lab 12/18/17 1050  TROPONINI <0.03   RADIOLOGY:  Dg Chest 1 View  Result Date: 12/18/2017 CLINICAL DATA:  Known left hip fracture EXAM: CHEST 1 VIEW COMPARISON:  None. FINDINGS: Cardiac shadows within normal limits. Aortic calcifications are noted. There is a 15 mm nodular density noted adjacent to the left hilum too large to represent a vessel on and. This is suspicious for an underlying pulmonary mass. CT of the chest with contrast is recommended when the patient is clinically able. No  other focal abnormality is noted. IMPRESSION: Left midlung nodule as described. CT of the chest is recommended when clinically able. Electronically Signed   By: Inez Catalina M.D.   On: 12/18/2017 11:30   Dg Hip Unilat W Or W/o Pelvis 2-3 Views Left  Result Date: 12/18/2017 CLINICAL DATA:  Hip replacement. EXAM: DG HIP (WITH OR WITHOUT PELVIS) 2-3V LEFT COMPARISON:  None. FINDINGS: The patient is status post left hip replacement. Acetabular and femoral components are in good position. IMPRESSION: Left hip replacement as above. Electronically Signed   By: Dorise Bullion III M.D   On: 12/18/2017 23:47   Dg Hip Unilat W Or W/o Pelvis 2-3 Views Left  Result Date: 12/18/2017 CLINICAL DATA:  Recent fall while walking dog with left hip pain, initial encounter EXAM: DG HIP (WITH OR WITHOUT PELVIS) 3V LEFT COMPARISON:  None. FINDINGS: There are changes consistent with a subcapital femoral neck fracture with impaction and angulation at the fracture site. The pelvic ring appears intact. No other focal abnormality is noted. IMPRESSION: Subcapital left femoral neck fracture Electronically Signed   By: Inez Catalina M.D.   On: 12/18/2017 11:29   ASSESSMENT AND PLAN:   Alexis Owens  is a 77 y.o. female with a known history per below, paroxysmal A. fib status post ablation-not on oral anticoagulation/noncompliant with diltiazem by choice, chronic tobacco smoking abuse/dependency, chronic benign lung nodule presenting status post fall, tripping over dog, resulting in left hip pain   1.acute left femoral neck fracture POD#1 -Patient doing well -PRN pain meds -Physical therapy recommends home health PT.  2 acute left cephalhematoma Status post fall Conservative management  3  hypothyroidism, unspecified Continue home regiment  4 chronic benign essential hypertension  5 chronic tobacco smoking abuse/dependency Nicotine patch and cessation counseling ordered  6 diet controlled diabetes mellitus type  2 Sugars well under control  7 chronic paroxysmal A. Fib Status post ablation  Stable -in normal sinus rhythm Not on oral anticoagulation by choice/noncompliant with diltiazem Continue diltiazem  Overall doing well.  If continues to improve will discharge home with home health PT tomorrow.  Case discussed with Care Management/Social Worker. Management plans discussed with the patient, family and they are in agreement.  CODE STATUS: Full  DVT Prophylaxis: Lovenox  TOTAL TIME TAKING CARE OF THIS PATIENT: 30 minutes.  >50% time spent on counselling and coordination of care  POSSIBLE D/C IN 1 DAYS, DEPENDING ON CLINICAL CONDITION.  Note: This dictation was prepared with Dragon dictation along with smaller phrase technology. Any transcriptional errors that result from this process are unintentional.  Fritzi Mandes M.D on 12/19/2017 at 4:54 PM  Between 7am to 6pm - Pager - [phone]  After 6pm go to [email or web address] - password [email or web address] [facility]  Office  [phone]  CC: Primary care physician; Mikey College, NPPatient ID: Alexis Owens, female   DOB: 07/19/1941, 77 y.o.   MRN: [id number]

## 2017-12-20 LAB — BASIC METABOLIC PANEL
Anion gap: 6 (ref 5–15)
BUN: 10 mg/dL (ref 6–20)
CHLORIDE: 107 mmol/L (ref 101–111)
CO2: 25 mmol/L (ref 22–32)
Calcium: 8.4 mg/dL — ABNORMAL LOW (ref 8.9–10.3)
Creatinine, Ser: 0.63 mg/dL (ref 0.44–1.00)
GFR calc Af Amer: >60 mL/min (ref >60)
GLUCOSE: 123 mg/dL — AB (ref 65–99)
POTASSIUM: 3.8 mmol/L (ref 3.5–5.1)
Sodium: 138 mmol/L (ref 135–145)

## 2017-12-20 LAB — CBC
HEMATOCRIT: 32.6 % — AB (ref 35.0–47.0)
Hemoglobin: 11.1 g/dL — ABNORMAL LOW (ref 12.0–16.0)
MCH: 34.7 pg — ABNORMAL HIGH (ref 26.0–34.0)
MCHC: 34 g/dL (ref 32.0–36.0)
MCV: 102 fL — AB (ref 80.0–100.0)
Platelets: 143 10*3/uL — ABNORMAL LOW (ref 150–440)
RBC: 3.2 MIL/uL — ABNORMAL LOW (ref 3.80–5.20)
RDW: 13.5 % (ref 11.5–14.5)
WBC: 8.8 10*3/uL (ref 3.6–11.0)

## 2017-12-20 LAB — SURGICAL PATHOLOGY

## 2017-12-20 MED ORDER — ENOXAPARIN SODIUM 40 MG/0.4ML ~~LOC~~ SOLN: 40.0000 mg | SUBCUTANEOUS | 0 refills | Status: DC

## 2017-12-20 MED ORDER — HYDROCODONE-ACETAMINOPHEN 5-325 MG PO TABS: 1.0000 | ORAL_TABLET | ORAL | 0 refills | Status: DC | PRN

## 2017-12-20 MED ORDER — MAGNESIUM HYDROXIDE 400 MG/5ML PO SUSP
30.0000 mL | Freq: Every day | ORAL | Status: DC | PRN
  Administered 2017-12-20: 30 mL via ORAL
  Filled 2017-12-20: qty 30

## 2017-12-20 NOTE — Care Management Note (Signed)
Case Management Note  Patient Details  Name: Alexis Owens MRN: [id number] Date of Birth: [birth year]   Patient status post hemiarthroplasty.  Patient lives at home with husband.  PCP Merrilyn Puma.  No medical equipment in the home.  PT has assessed patient and recommends home health PT.  Patient agreeable to services.  Patient states she does not have a preference of home health agencies.  Referral made to [facility].  RW to be delivered to room prior to discharge.  RNCM signing off.     Subjective/Objective:                    Action/Plan:   Expected Discharge Date:  12/20/17               Expected Discharge Plan:  [place]  In-House Referral:     Discharge planning Services  CM Consult  Post Acute Care Choice:  Durable Medical Equipment, Home Health Choice offered to:  Patient  DME Arranged:  [facility] DME Agency:  [place] Arranged:  PT [facility] Agency:  [place]  Status of Service:  Completed, signed off  If discussed at [place] of Stay Meetings, dates discussed:    Additional Comments:  Beverly Sessions, RN 12/20/2017, 11:59 AM

## 2017-12-20 NOTE — Progress Notes (Signed)
Discharge instructions reviewed with patient. Prescriptions to be picked up at pharmacy, patient made aware. Equipment sent with patient. Patient discharged via w/c home with husband.

## 2017-12-20 NOTE — Progress Notes (Signed)
Physical Therapy Treatment Patient Details Name: Alexis Owens MRN: [id number] DOB: 1941-04-11 Today's Date: 12/20/2017    History of Present Illness 77 y/o who fell and suffered L hip fx, had L hip hemiarthroplasty 12/18/17.    PT Comments    Pt did well with PT session and despite continuing to need cuing for precautions, safety during mobility/transitions, as well as continued issues with hesitancy using walker/WBing during ambulation.  Pt very eager to work with PT and overall did well. Pt eager to go home and continue with HHPT.   Follow Up Recommendations  Home health PT     Equipment Recommendations  Rolling walker with 5" wheels    Recommendations for Other Services       Precautions / Restrictions Precautions Precautions: Fall;Posterior Hip Precaution Booklet Issued: Yes (comment) Restrictions LLE Weight Bearing: Weight bearing as tolerated    Mobility  Bed Mobility Overal bed mobility: Modified Independent             General bed mobility comments: Pt needed minimal reminders for precautions getting up to EOB, but ultimately did not require phyiscal assist  Transfers Overall transfer level: Modified independent Equipment used: Rolling walker (2 wheeled)             General transfer comment: Reminders for hand placement but overall did well  Ambulation/Gait Ambulation/Gait assistance: Min guard Ambulation Distance (Feet): 250 Feet Assistive device: Rolling walker (2 wheeled)       General Gait Details: Pt still hesitant to put full weight on the L and relied heavily on UEs.  She did needed to take a few brief standing rest breaks secondary to UE fatigue and general fatigue.  Overall pt was safe despite some hesitancy, eager to walk as much as she could.   Stairs Stairs: Yes   Stair Management: No rails;With walker Number of Stairs: 1 General stair comments: simulated getting into her home over the threshold, pt did well with light  cuing  Wheelchair Mobility    Modified Rankin (Stroke Patients Only)       Balance Overall balance assessment: Modified Independent                                          Cognition Arousal/Alertness: Awake/alert Behavior During Therapy: WFL for tasks assessed/performed Overall Cognitive Status: Within Functional Limits for tasks assessed                                        Exercises Total Joint Exercises Ankle Circles/Pumps: Strengthening;10 reps Quad Sets: Strengthening;10 reps Gluteal Sets: Strengthening;10 reps Short Arc Quad: Strengthening;15 reps Heel Slides: Strengthening;10 reps Hip ABduction/ADduction: Strengthening;10 reps Straight Leg Raises: AROM;10 reps    General Comments        Pertinent Vitals/Pain Pain Assessment: 0-10 Pain Score: 4     Home Living                      Prior Function            PT Goals (current goals can now be found in the care plan section) Progress towards PT goals: Progressing toward goals    Frequency    BID      PT Plan Current plan remains appropriate    Co-evaluation  AM-PAC PT "6 Clicks" Daily Activity  Outcome Measure  Difficulty turning over in bed (including adjusting bedclothes, sheets and blankets)?: None Difficulty moving from lying on back to sitting on the side of the bed? : A Little Difficulty sitting down on and standing up from a chair with arms (e.g., wheelchair, bedside commode, etc,.)?: A Little Help needed moving to and from a bed to chair (including a wheelchair)?: None Help needed walking in hospital room?: None Help needed climbing 3-5 steps with a railing? : A Little 6 Click Score: 21    End of Session Equipment Utilized During Treatment: Gait belt Activity Tolerance: Patient tolerated treatment well Patient left: with call bell/phone within reach;with family/visitor present;with bed alarm set   PT Visit Diagnosis:  Muscle weakness (generalized) (M62.81);Difficulty in walking, not elsewhere classified (R26.2)     Time: 8381-8403 PT Time Calculation (min) (ACUTE ONLY): 42 min  Charges:  $Gait Training: 8-22 mins $Therapeutic Exercise: 8-22 mins $Therapeutic Activity: 8-22 mins                    G Codes:       Kreg Shropshire, DPT 12/20/2017, 12:26 PM

## 2017-12-20 NOTE — Care Management Important Message (Signed)
Important Message  Patient Details  Name: Alexis Owens MRN: [id number] Date of Birth: [birth year]   Medicare Important Message Given:  N/A - LOS <3 / Initial given by admissions    Beverly Sessions, RN 12/20/2017, 12:01 PM

## 2017-12-20 NOTE — Progress Notes (Signed)
  Subjective: 2 Days Post-Op Procedure(s) (LRB): ARTHROPLASTY BIPOLAR HIP (HEMIARTHROPLASTY) (Left) Patient reports pain as mild.   Patient is well, and has had no acute complaints or problems Plan is for discharge home today with HHPT. Negative for chest pain and shortness of breath Fever: Mild fevers last night, continue incentive spirometer Gastrointestinal:Negative for nausea and vomiting  Objective: Vital signs in last 24 hours: Temp:  [98.9 F (37.2 C)-99.8 F (37.7 C)] 98.9 F (37.2 C) (02/08 0752) Pulse Rate:  [82-109] 93 (02/08 0752) Resp:  [18-20] 18 (02/07 1942) BP: (116-143)/(53-69) 139/69 (02/08 0752) SpO2:  [88 %-100 %] 100 % (02/08 0752)  Intake/Output from previous day:  Intake/Output Summary (Last 24 hours) at 12/20/2017 0805 Last data filed at 12/19/2017 1300 Gross per 24 hour  Intake 60 ml  Output -  Net 60 ml    Intake/Output this shift: No intake/output data recorded.  Labs: Recent Labs    12/18/17 1050 12/19/17 0605  HGB 12.7 11.3*   Recent Labs    12/18/17 1050 12/19/17 0605  WBC 10.8 8.1  RBC 3.69* 3.24*  HCT 37.3 33.1*  PLT 170 142*   Recent Labs    12/19/17 0605 12/20/17 0355  NA 140 138  K 5.0 3.8  CL 108 107  CO2 26 25  BUN 12 10  CREATININE 0.76 0.63  GLUCOSE 113* 123*  CALCIUM 8.4* 8.4*   No results for input(s): LABPT, INR in the last 72 hours.   EXAM General - Patient is Alert, Appropriate and Oriented Extremity - ABD soft Sensation intact distally Intact pulses distally Dorsiflexion/Plantar flexion intact Incision: scant drainage No cellulitis present Dressing/Incision - Scant bloody drainage. Motor Function - intact, moving foot and toes well on exam.   Abdomen soft with normal BS.  Past Medical History:  Diagnosis Date  . Allergy   . Arthritis   . Bunion   . Chronic sinusitis   . High risk medication use   . Hyperlipidemia   . Hypertension   . Hypothyroidism   . Insomnia   . Macular degeneration    . Osteoporosis   . Paroxysmal A-fib ([place])   . Prediabetes   . Rheumatoid arthritis (HCC)     Assessment/Plan: 2 Days Post-Op Procedure(s) (LRB): ARTHROPLASTY BIPOLAR HIP (HEMIARTHROPLASTY) (Left) Active Problems:   Hip fx (HCC)  Estimated body mass index is 19.75 kg/m as calculated from the following:   Height as of this encounter: 5\' 2"  (1.575 m).   Weight as of this encounter: 49 kg (108 lb). Advance diet Up with therapy D/C IV fluids when tolerating po intake.  Continue to work on having a BM. Up with PT today.  Discharge home when medically appropriate. Upon discharge home continue Lovenox 40mg  daily for 14 days. Follow-up in the office in two weeks for staple removal.  DVT Prophylaxis - Lovenox, Foot Pumps and TED hose Weight-Bearing as tolerated to left leg  J. Cameron Proud, PA-C [facility] 12/20/2017, 8:05 AM

## 2017-12-20 NOTE — Discharge Summary (Signed)
[facility] NAME: Alexis Owens    MR#:  [id number]  DATE OF BIRTH:  January 06, 1941  DATE OF ADMISSION:  12/18/2017 ADMITTING PHYSICIAN: Gorden Harms, MD  DATE OF DISCHARGE: 12/20/2017  PRIMARY CARE PHYSICIAN: Mikey College, NP    ADMISSION DIAGNOSIS:  Fall, initial encounter [phone]] Closed left hip fracture, initial encounter ([place]) [S72.002A]  DISCHARGE DIAGNOSIS:  Closed left femoral neck fracture status post left hip hemiarthroplasty.  SECONDARY DIAGNOSIS:   Past Medical History:  Diagnosis Date  . Allergy   . Arthritis   . Bunion   . Chronic sinusitis   . High risk medication use   . Hyperlipidemia   . Hypertension   . Hypothyroidism   . Insomnia   . Macular degeneration   . Osteoporosis   . Paroxysmal A-fib ([place])   . Prediabetes   . Rheumatoid arthritis [facility])     HOSPITAL COURSE:  SilviaBuchananis a77 y.o.femalewith a known historyper below, paroxysmal A. fib status post ablation-not on oral anticoagulation/noncompliant with diltiazem by choice, chronic tobacco smoking abuse/dependency, chronic benign lung nodule presenting status post fall, tripping over dog, resulting in left hip pain  1.acute left femoral neck fracture POD# 2 -Patient doing well -PRN pain meds -Physical therapy recommends home health PT. -Lovenox subcu for 14 days  2acute left cephalhematoma Status post fall Conservative management--- mental status normal  3 hypothyroidism, unspecified Continue home regiment  4chronic benign essential hypertension  5chronic tobacco smoking abuse/dependency Nicotine patch and cessation counseling ordered  6diet controlled diabetes mellitus type 2 Sugars well under control  7chronic paroxysmal A. Fib Status post ablation Stable-in normal sinus rhythm Not on oral anticoagulation by choice/noncompliant with diltiazem Continue diltiazem  Patient overall  doing well.  Okay from orthopedic standpoint with Dr. Roland Rack to go home.   CONSULTS OBTAINED:  Treatment Team:  Corky Mull, MD  DRUG ALLERGIES:   Allergies  Allergen Reactions  . Penicillin G Hives  . Penicillins Hives    Has patient had a PCN reaction causing immediate rash, facial/tongue/throat swelling, SOB or lightheadedness with hypotension: Yes Has patient had a PCN reaction causing severe rash involving mucus membranes or skin necrosis: No Has patient had a PCN reaction that required hospitalization: No Has patient had a PCN reaction occurring within the last 10 years: No If all of the above answers are "NO", then may proceed with Cephalosporin use.  . Sulfa Antibiotics Other (See Comments)    Mouth sores Other reaction(s): Other (See Comments) Mouth sores  . Sulfasalazine     Other reaction(s): Other (See Comments) Mouth sores    DISCHARGE MEDICATIONS:   Allergies as of 12/20/2017      Reactions   Penicillin G Hives   Penicillins Hives   Has patient had a PCN reaction causing immediate rash, facial/tongue/throat swelling, SOB or lightheadedness with hypotension: Yes Has patient had a PCN reaction causing severe rash involving mucus membranes or skin necrosis: No Has patient had a PCN reaction that required hospitalization: No Has patient had a PCN reaction occurring within the last 10 years: No If all of the above answers are "NO", then may proceed with Cephalosporin use.   Sulfa Antibiotics Other (See Comments)   Mouth sores Other reaction(s): Other (See Comments) Mouth sores   Sulfasalazine    Other reaction(s): Other (See Comments) Mouth sores      Medication List    TAKE these medications   albuterol 108 (90 Base)  MCG/ACT inhaler Commonly known as:  PROVENTIL HFA;VENTOLIN HFA Inhale 1-2 puffs into the lungs every 6 (six) hours as needed for wheezing or shortness of breath.   alendronate 70 MG tablet Commonly known as:  FOSAMAX TAKE 1 TABLET BY MOUTH  ONCE A WEEK, WITH A FULL GLASS OF WATER ON AN EMPTY STOMACH, REMAIN UPRIGHT FOR 30 MINUTES   cromolyn 5.2 MG/ACT nasal spray Commonly known as:  NASALCROM Place 1 spray into both nostrils as needed for allergies.   diltiazem 180 MG 24 hr capsule Commonly known as:  DILACOR XR Take 1 capsule (180 mg total) by mouth daily.   enoxaparin 40 MG/0.4ML injection Commonly known as:  LOVENOX Inject 0.4 mLs (40 mg total) into the skin daily.   fluticasone 50 MCG/ACT nasal spray Commonly known as:  FLONASE Place 2 sprays into both nostrils daily.   folic acid 1 MG tablet Commonly known as:  FOLVITE Take 1 mg by mouth daily.   gabapentin 100 MG capsule Commonly known as:  NEURONTIN Take 1 tablet (100mg ) twice daily as needed then take 3 tablets (300mg ) at bedtime. What changed:    how much to take  how to take this  when to take this  additional instructions   HYDROcodone-acetaminophen 5-325 MG tablet Commonly known as:  NORCO/VICODIN Take 1 tablet by mouth every 4 (four) hours as needed for moderate pain.   hydroxychloroquine 200 MG tablet Commonly known as:  PLAQUENIL Take 300 mg by mouth daily.   levothyroxine 25 MCG tablet Commonly known as:  SYNTHROID, LEVOTHROID Take 1 tablet (25 mcg total) by mouth daily before breakfast.   lisinopril 20 MG tablet Commonly known as:  PRINIVIL,ZESTRIL Take 20 mg by mouth daily.   methotrexate 2.5 MG tablet Commonly known as:  RHEUMATREX Take 12.5 mg by mouth once a week. Caution:Chemotherapy. Protect from light.   omeprazole 20 MG capsule Commonly known as:  PRILOSEC Take 20 mg by mouth daily.   traZODone 50 MG tablet Commonly known as:  DESYREL Take 50 mg by mouth at bedtime.            Durable Medical Equipment  (From admission, onward)        Start     Ordered   12/20/17 0708  For home use only DME Walker rolling  Once    Question:  Patient needs a walker to treat with the following condition  Answer:  Hip  fracture ([place])   12/20/17 0707      If you experience worsening of your admission symptoms, develop shortness of breath, life threatening emergency, suicidal or homicidal thoughts you must seek medical attention immediately by calling 911 or calling your MD immediately  if symptoms less severe.  You Must read complete instructions/literature along with all the possible adverse reactions/side effects for all the Medicines you take and that have been prescribed to you. Take any new Medicines after you have completely understood and accept all the possible adverse reactions/side effects.   Please note  You were cared for by a hospitalist during your hospital stay. If you have any questions about your discharge medications or the care you received while you were in the hospital after you are discharged, you can call the unit and asked to speak with the hospitalist on call if the hospitalist that took care of you is not available. Once you are discharged, your primary care physician will handle any further medical issues. Please note that NO REFILLS for any discharge medications will  be authorized once you are discharged, as it is imperative that you return to your primary care physician (or establish a relationship with a primary care physician if you do not have one) for your aftercare needs so that they can reassess your need for medications and monitor your lab values. Today   SUBJECTIVE   Doing overall well.  She wants to go home.  VITAL SIGNS:  Blood pressure (!) 143/64, pulse 82, temperature 99.8 F (37.7 C), temperature source Oral, resp. rate 18, height 5\' 2"  (1.575 m), weight 49 kg (108 lb), SpO2 97 %.  I/O:    Intake/Output Summary (Last 24 hours) at 12/20/2017 0735 Last data filed at 12/19/2017 1300 Gross per 24 hour  Intake 60 ml  Output -  Net 60 ml    PHYSICAL EXAMINATION:  GENERAL:  77 y.o.-year-old patient lying in the bed with no acute distress.  EYES: Pupils equal, round,  reactive to light and accommodation. No scleral icterus. Extraocular muscles intact.  HEENT: Head atraumatic, normocephalic. Oropharynx and nasopharynx clear.  NECK:  Supple, no jugular venous distention. No thyroid enlargement, no tenderness.  LUNGS: Normal breath sounds bilaterally, no wheezing, rales,rhonchi or crepitation. No use of accessory muscles of respiration.  CARDIOVASCULAR: S1, S2 normal. No murmurs, rubs, or gallops.  ABDOMEN: Soft, non-tender, non-distended. Bowel sounds present. No organomegaly or mass.  EXTREMITIES: No pedal edema, cyanosis, or clubbing.  NEUROLOGIC: Cranial nerves II through XII are intact. Muscle strength 5/5 in all extremities. Sensation intact. Gait not checked.  PSYCHIATRIC: The patient is alert and oriented x 3.  SKIN: No obvious rash, lesion, or ulcer.   DATA REVIEW:   CBC  Recent Labs  Lab 12/19/17 0605  WBC 8.1  HGB 11.3*  HCT 33.1*  PLT 142*    Chemistries  Recent Labs  Lab 12/18/17 1050  12/20/17 0355  NA 137   < > 138  K 4.5   < > 3.8  CL 105   < > 107  CO2 23   < > 25  GLUCOSE 99   < > 123*  BUN 17   < > 10  CREATININE 0.69   < > 0.63  CALCIUM 9.2   < > 8.4*  AST 34  --   --   ALT 19  --   --   ALKPHOS 45  --   --   BILITOT 0.6  --   --    < > = values in this interval not displayed.    Microbiology Results   Recent Results (from the past 240 hour(s))  MRSA PCR Screening     Status: None   Collection Time: 12/18/17  3:25 PM  Result Value Ref Range Status   MRSA by PCR NEGATIVE NEGATIVE Final    Comment:        The GeneXpert MRSA Assay (FDA approved for NASAL specimens only), is one component of a comprehensive MRSA colonization surveillance program. It is not intended to diagnose MRSA infection nor to guide or monitor treatment for MRSA infections. Performed at [facility], [address] [place], [place] [address]     RADIOLOGY:  Dg Chest 1 View  Result Date: 12/18/2017 CLINICAL DATA:  Known  left hip fracture EXAM: CHEST 1 VIEW COMPARISON:  None. FINDINGS: Cardiac shadows within normal limits. Aortic calcifications are noted. There is a 15 mm nodular density noted adjacent to the left hilum too large to represent a vessel on and. This is suspicious for an  underlying pulmonary mass. CT of the chest with contrast is recommended when the patient is clinically able. No other focal abnormality is noted. IMPRESSION: Left midlung nodule as described. CT of the chest is recommended when clinically able. Electronically Signed   By: Inez Catalina M.D.   On: 12/18/2017 11:30   Dg Hip Unilat W Or W/o Pelvis 2-3 Views Left  Result Date: 12/18/2017 CLINICAL DATA:  Hip replacement. EXAM: DG HIP (WITH OR WITHOUT PELVIS) 2-3V LEFT COMPARISON:  None. FINDINGS: The patient is status post left hip replacement. Acetabular and femoral components are in good position. IMPRESSION: Left hip replacement as above. Electronically Signed   By: Dorise Bullion III M.D   On: 12/18/2017 23:47   Dg Hip Unilat W Or W/o Pelvis 2-3 Views Left  Result Date: 12/18/2017 CLINICAL DATA:  Recent fall while walking dog with left hip pain, initial encounter EXAM: DG HIP (WITH OR WITHOUT PELVIS) 3V LEFT COMPARISON:  None. FINDINGS: There are changes consistent with a subcapital femoral neck fracture with impaction and angulation at the fracture site. The pelvic ring appears intact. No other focal abnormality is noted. IMPRESSION: Subcapital left femoral neck fracture Electronically Signed   By: Inez Catalina M.D.   On: 12/18/2017 11:29     Management plans discussed with the patient, family and they are in agreement.  CODE STATUS:     Code Status Orders  (From admission, onward)        Start     Ordered   12/18/17 1316  Full code  Continuous     12/18/17 1316    Code Status History    Date Active Date Inactive Code Status Order ID Comments User Context   This patient has a current code status but no historical code status.       TOTAL TIME TAKING CARE OF THIS PATIENT: 40 minutes.    Fritzi Mandes M.D on 12/20/2017 at 7:35 AM  Between 7am to 6pm - Pager - [phone] After 6pm go to [email or web address] - password [email or web address] [facility]  Office  [phone]  CC: Primary care physician; Mikey College, NP

## 2017-12-20 NOTE — Discharge Instructions (Signed)
Instructions after Total Hip Replacement     J. Dorien Chihuahua, M.D.  Raquel Jenavive Lamboy, PA-C     Dept. of [facility]  [address] [place], [place]  [address]  Phone: [phone]   Fax: [phone]    DIET:  Drink plenty of non-alcoholic fluids.  Resume your normal diet. Include foods high in fiber.  ACTIVITY:   You may use crutches or a walker with weight-bearing as tolerated, unless instructed otherwise.  You may be weaned off of the walker or crutches by your Physical Therapist.   Do NOT reach below the level of your knees or cross your legs until allowed.     Continue doing gentle exercises. Exercising will reduce the pain and swelling, increase motion, and prevent muscle weakness.    Please continue to use the TED compression stockings for 6 weeks. You may remove the stockings at night, but should reapply them in the morning.  Do not drive or operate any equipment until instructed.  WOUND CARE:   Continue to use ice packs periodically to reduce pain and swelling.  Keep the incision clean and dry.  You may bathe or shower after the staples are removed at the first office visit following surgery.  MEDICATIONS:  You may resume your regular medications.  Please take the pain medication as prescribed on the medication.  Do not take pain medication on an empty stomach.  You have been given a prescription for a blood thinner to prevent blood clots. Please take the medication as instructed. (NOTE: After completing a 2 week course of Lovenox, take one Enteric-coated aspirin once a day.)  Pain medications and iron supplements can cause constipation. Use a stool softener (Senokot or Colace) on a daily basis and a laxative (dulcolax or miralax) as needed.  Do not drive or drink alcoholic beverages when taking pain medications.  CALL THE OFFICE FOR:  Temperature above 101 degrees  Excessive bleeding or drainage on the  dressing.  Excessive swelling, coldness, or paleness of the toes.  Persistent nausea and vomiting.  FOLLOW-UP:   Call for a follow-up appointment in two weeks for staple removal. Continue Lovenox 40mg  daily for 14 days.  Arrangements have been made for continuation of Physical Therapy (either home therapy or outpatient therapy).

## 2017-12-27 ENCOUNTER — Ambulatory Visit (INDEPENDENT_AMBULATORY_CARE_PROVIDER_SITE_OTHER): Payer: Medicare Other | Admitting: Nurse Practitioner

## 2017-12-27 ENCOUNTER — Encounter: Payer: Self-pay | Admitting: Nurse Practitioner

## 2017-12-27 VITALS — BP 131/65 | HR 82 | Temp 97.5°F | Ht 62.0 in | Wt 115.0 lb

## 2017-12-27 DIAGNOSIS — Z09 Encounter for follow-up examination after completed treatment for conditions other than malignant neoplasm: Secondary | ICD-10-CM

## 2017-12-27 DIAGNOSIS — M80052S Age-related osteoporosis with current pathological fracture, left femur, sequela: Secondary | ICD-10-CM | POA: Diagnosis not present

## 2017-12-27 DIAGNOSIS — Z96642 Presence of left artificial hip joint: Secondary | ICD-10-CM

## 2017-12-27 NOTE — Patient Instructions (Addendum)
Alexis Owens, Thank you for coming in to clinic today.  1. Continue all medications as ordered from hospital. - START taking 3 calcium with vitamin D tablets.  Take with lunch, dinner, and bedtime. -    Please schedule a follow-up appointment with Cassell Smiles, AGNP. Return in about 3 months (around 03/26/2018) for osteoporosis, Hypertension.  If you have any other questions or concerns, please feel free to call the clinic or send a message through [place]. You may also schedule an earlier appointment if necessary.  You will receive a survey after today's visit either digitally by e-mail or paper by [employer]. Your experiences and feedback matter to [place].  Please respond so we know how we are doing as we provide care for you.   Cassell Smiles, DNP, AGNP-BC Adult Gerontology Nurse Practitioner [place]

## 2017-12-27 NOTE — Assessment & Plan Note (Addendum)
Pt with left hip fracture, s/p left hip total arthroplasty on 12/19/2017.  Pt fell after being knocked off balance by her dog.  Pt with currently well healing incision, no apparent complications.  Pt is ambulating well and has not had any difficulty with therapy to date.  Continues to take hydrocodone-acetaminophen prn.   - Pt's last DEXA scan showed stable L spine, decrease in BMD of 3% in femoral neck over 3 years.  Last DEXA scan 12/2016.  Pt on therapy w/ alendronate once weekly per Rheumatology in past. Pt due for repeat DEXA on 12/2017 for 1 yr post therapy scan, but has declined today.  Plan: 1. Resume calcium and vitaminD supplement tid.   - Continue alendronate once weekly. - Continue PPI prophylaxis. 2. Encouraged weight bearing exercises/muscle resistance.   3. Continue PT for improving balance and hip replacement rehab. 4. Followup 3 months - revisit DEXA scan at that time.

## 2017-12-27 NOTE — Progress Notes (Signed)
Subjective:    Patient ID: Alexis Owens, female    DOB: July 23, 1941, 77 y.o.   MRN: [id number]  Alexis Owens is a 77 y.o. female presenting on 12/27/2017 for Hospitalization Follow-up (fell broke her left hip after dog ran into her.)   [place]: Alexis Owens Date of Admission: 12/18/2017 Date of Discharge: 12/20/2017  Reason for Admission: Left hip fracture Primary (+Secondary) Diagnosis: left cephalhematoma, hypothyroidism, hypertension, tobacco smoking, T2DM, Chronic Paroxysmal Afib  - Hospital H&P and Discharge Summary have been reviewed - Patient presents today 7 days after recent hospitalization discharge. Brief summary of recent course, patient had a fall outdoors on a brick patio after being knocked over by her support dog.  She had recently gotten a puppy which disrupted the norm for her prior dog, causing unpredictable movements leading to the fall.    She reported to ER and was hospitalized, treated with Left total hip replacement, and discharged home with advanced home health for physical therapy. - Today reports overall has done well after discharge.  - New medications on discharge: Hydrocodone-acetaminophen 5-325 mg q 4 hr prn moderate pain - Changes to current meds on discharge: no changes to prior medications - reinforced that patient needs to continuously take diltiazem  Physical therapy is coming to home.  Now is working to balance strength of left and right leg.  Is still taking fosamax.  - Is moving frequently and trying not to stay too sedentary.  Is performing PT exercises three times daily. - Alexis Owens is PT for home health.  Toilet seat is low. - Needs toilet riser and shower bench.  States she needs an order from me for these DME devices. - Will see orthopedic Dr. Roland Rack for staple removal on Friday.  Social History   Tobacco Use  . Smoking status: Former Smoker    Last attempt to quit: 07/19/2014    Years since quitting: 3.4  .  Smokeless tobacco: Never Used  Substance Use Topics  . Alcohol use: Yes  . Drug use: No    Review of Systems Per HPI unless specifically indicated above  Outpatient Encounter Medications as of 12/27/2017  Medication Sig Note  . albuterol (PROVENTIL HFA;VENTOLIN HFA) 108 (90 Base) MCG/ACT inhaler Inhale 1-2 puffs into the lungs every 6 (six) hours as needed for wheezing or shortness of breath.   Marland Kitchen alendronate (FOSAMAX) 70 MG tablet TAKE 1 TABLET BY MOUTH ONCE A WEEK, WITH A FULL GLASS OF WATER ON AN EMPTY STOMACH, REMAIN UPRIGHT FOR 30 MINUTES   . cromolyn (NASALCROM) 5.2 MG/ACT nasal spray Place 1 spray into both nostrils as needed for allergies.   Marland Kitchen diltiazem (DILACOR XR) 180 MG 24 hr capsule Take 1 capsule (180 mg total) by mouth daily.   Marland Kitchen enoxaparin (LOVENOX) 40 MG/0.4ML injection Inject 0.4 mLs (40 mg total) into the skin daily.   . fluticasone (FLONASE) 50 MCG/ACT nasal spray Place 2 sprays into both nostrils daily.   . folic acid (FOLVITE) 1 MG tablet Take 1 mg by mouth daily. 12/18/2017: DO NOT TAKE ON WHEN TAKING METHOTREXATE  . gabapentin (NEURONTIN) 100 MG capsule Take 1 tablet (100mg ) twice daily as needed then take 3 tablets (300mg ) at bedtime. (Patient taking differently: Take 400 mg by mouth daily. 100mg  morning 300mg  at bedtime)   . HYDROcodone-acetaminophen (NORCO/VICODIN) 5-325 MG tablet Take 1 tablet by mouth every 4 (four) hours as needed for moderate pain.   . hydroxychloroquine (PLAQUENIL) 200 MG tablet Take 300 mg  by mouth daily.    Marland Kitchen levothyroxine (SYNTHROID, LEVOTHROID) 25 MCG tablet Take 1 tablet (25 mcg total) by mouth daily before breakfast.   . lisinopril (PRINIVIL,ZESTRIL) 20 MG tablet Take 20 mg by mouth daily.   . methotrexate (RHEUMATREX) 2.5 MG tablet Take 12.5 mg by mouth once a week. Caution:Chemotherapy. Protect from light.    Marland Kitchen omeprazole (PRILOSEC) 20 MG capsule Take 20 mg by mouth daily.   . traZODone (DESYREL) 50 MG tablet Take 50 mg by mouth at  bedtime.    No facility-administered encounter medications on file as of 12/27/2017.        Objective:    BP 131/65 (BP Location: Right Arm, Patient Position: Sitting, Cuff Size: Normal)   Pulse 82   Temp (!) 97.5 F (36.4 C) (Oral)   Ht 5\' 2"  (1.575 m)   Wt 115 lb (52.2 kg)   BMI 21.03 kg/m   Wt Readings from Last 3 Encounters:  12/27/17 115 lb (52.2 kg)  12/18/17 108 lb (49 kg)  10/14/17 108 lb 3.2 oz (49.1 kg)    Physical Exam  General - healthy weight, frail elderly female, well-appearing, NAD HEENT - Normocephalic, atraumatic Neck - supple, non-tender, no LAD, no thyromegaly, no carotid bruit Heart - RRR, no murmurs heard Lungs - Clear throughout all lobes, no wheezing, crackles, or rhonchi. Normal work of breathing. Extremeties - non-tender, trace edema, cap refill < 2 seconds, peripheral pulses intact +2 bilaterally Skin - warm, dry - stable lateral left hip incision with intact dressing.  Allevyn patch remains on lumbar spine and is oversaturated with water - removed today with normal skin beneath. Pt reports was spinal tap location and is currently well healed. Neuro - awake, alert, oriented x3, antalgic gait using two wheel walker Psych - Normal mood and affect, normal behavior      Assessment & Plan:   Problem List Items Addressed This Visit      Musculoskeletal and Integument   Age-related osteoporosis with current pathol fracture of left femur, sequela    Pt with left hip fracture, s/p left hip total arthroplasty on 12/19/2017.  Pt fell after being knocked off balance by her dog.  Pt with currently well healing incision, no apparent complications.  Pt is ambulating well and has not had any difficulty with therapy to date.  Continues to take hydrocodone-acetaminophen prn.   - Pt's last DEXA scan showed stable L spine, decrease in BMD of 3% in femoral neck over 3 years.  Last DEXA scan 12/2016.  Pt on therapy w/ alendronate once weekly per Rheumatology in past. Pt due  for repeat DEXA on 12/2017 for 1 yr post therapy scan, but has declined today.  Plan: 1. Resume calcium and vitaminD supplement tid.   - Continue alendronate once weekly. - Continue PPI prophylaxis. 2. Encouraged weight bearing exercises/muscle resistance.   3. Continue PT for improving balance and hip replacement rehab. 4. Followup 3 months - revisit DEXA scan at that time.        Other   Status post total hip replacement, left    Other Visit Diagnoses    Hospital discharge follow-up    -  Primary       I have reviewed the discharge medication list, and have reconciled the current and discharge medications today.  Follow up plan: Return in about 3 months (around 03/26/2018) for osteoporosis, Hypertension.  Cassell Smiles, DNP, AGPCNP-BC Adult Gerontology Primary Care Nurse Practitioner [facility] 12/27/2017, 2:17 PM

## 2017-12-30 ENCOUNTER — Other Ambulatory Visit: Payer: Self-pay | Admitting: Nurse Practitioner

## 2017-12-30 DIAGNOSIS — Z96642 Presence of left artificial hip joint: Secondary | ICD-10-CM

## 2018-01-14 ENCOUNTER — Other Ambulatory Visit: Payer: Self-pay | Admitting: Nurse Practitioner

## 2018-01-14 DIAGNOSIS — E039 Hypothyroidism, unspecified: Secondary | ICD-10-CM

## 2018-01-14 MED ORDER — LEVOTHYROXINE SODIUM 25 MCG PO TABS: 25.0000 ug | ORAL_TABLET | Freq: Every day | ORAL | 1 refills | Status: DC

## 2018-01-24 ENCOUNTER — Encounter: Payer: Self-pay | Admitting: Nurse Practitioner

## 2018-01-24 ENCOUNTER — Other Ambulatory Visit: Payer: Self-pay

## 2018-01-24 MED ORDER — ALBUTEROL SULFATE HFA 108 (90 BASE) MCG/ACT IN AERS: 1.0000 | INHALATION_SPRAY | Freq: Four times a day (QID) | RESPIRATORY_TRACT | 1 refills | Status: DC | PRN

## 2018-01-27 ENCOUNTER — Telehealth: Payer: Self-pay | Admitting: Student

## 2018-01-27 NOTE — Telephone Encounter (Signed)
Pt needs a refill on proair sent to [facility] in [place]

## 2018-01-31 ENCOUNTER — Ambulatory Visit
Admission: RE | Admit: 2018-01-31 | Discharge: 2018-01-31 | Disposition: A | Discharge status: Home / Self Care | Payer: Medicare Other | Source: Ambulatory Visit | Attending: Nurse Practitioner | Admitting: Nurse Practitioner

## 2018-01-31 ENCOUNTER — Other Ambulatory Visit: Payer: Self-pay

## 2018-01-31 ENCOUNTER — Ambulatory Visit (INDEPENDENT_AMBULATORY_CARE_PROVIDER_SITE_OTHER): Payer: Medicare Other | Admitting: Nurse Practitioner

## 2018-01-31 ENCOUNTER — Encounter: Payer: Self-pay | Admitting: Nurse Practitioner

## 2018-01-31 VITALS — BP 135/70 | HR 82 | Temp 97.9°F | Resp 20 | Ht 62.0 in | Wt 110.6 lb

## 2018-01-31 DIAGNOSIS — J22 Unspecified acute lower respiratory infection: Secondary | ICD-10-CM

## 2018-01-31 DIAGNOSIS — R509 Fever, unspecified: Secondary | ICD-10-CM | POA: Diagnosis not present

## 2018-01-31 MED ORDER — BENZONATATE 100 MG PO CAPS: 100.0000 mg | ORAL_CAPSULE | Freq: Three times a day (TID) | ORAL | 0 refills | Status: DC | PRN

## 2018-01-31 MED ORDER — AZITHROMYCIN 250 MG PO TABS: ORAL_TABLET | ORAL | 0 refills | Status: DC

## 2018-01-31 NOTE — Patient Instructions (Addendum)
Alexis Owens,   Thank you for coming in to clinic today.  It sounds like you have a bronchitis or pneumonia with upper respiratory sinus pressure infection.  Recommend good hand washing. - START taking azithromycin 250 mg tablet. Take 2 tablets today.  Then, take 1 tablet daily for the next 4 days and stop. .  Make sure to take all doses of your antibiotic. - While you are on an antibiotic, take a probiotic.  Antibiotics kill good and bad bacteria.  A probiotic helps to replace your good bacteria. Probiotic pills can be found over the counter.  One brand is Florastor, but you can use any brand you prefer.  You can also get good bacteria from foods.  These foods are yogurt, kefir, kombucha, and fresh, refrigerated and uncooked sauerkraut. - Drink plenty of fluids.  - Continue anti-histamine cetirizine 10mg  daily. - You may also use Flonase 2 sprays each nostril daily for up to 4-6 weeks  Other over the counter medications you may try, if needed for symptoms are: - If congestion is worse, start OTC Mucinex (or may try Mucinex-DM for cough) up to 7-10 days then stop - You may try over the counter Nasal Saline spray (Simply Saline, Ocean Spray) as needed to reduce congestion. - Start taking Tylenol extra strength 1 to 2 tablets every 6-8 hours for aches or fever/chills for next few days as needed.  Do not take more than 3,000 mg in 24 hours from all medicines.   Please schedule a follow-up appointment with Cassell Smiles, AGNP. Return 5-7 days if symptoms worsen or fail to improve.  If you have any other questions or concerns, please feel free to call the clinic or send a message through [place]. You may also schedule an earlier appointment if necessary.  You will receive a survey after today's visit either digitally by e-mail or paper by [employer]. Your experiences and feedback matter to [place].  Please respond so we know how we are doing as we provide care for you.   Cassell Smiles, DNP,  AGNP-BC Adult Gerontology Nurse Practitioner [place]

## 2018-01-31 NOTE — Progress Notes (Signed)
Subjective:    Patient ID: Alexis Owens, female    DOB: 13-Oct-1941, 77 y.o.   MRN: [id number]  Alexis Owens is a 77 y.o. female presenting on 01/31/2018 for Influenza (chest congestion w/ sharp pain under the shoulder blade, fever x 1 week. Symptoms are starting to improve, but not resolved. Pt had hip surgery 12/18/17. )   HPI Cough with fever Pt presents today 7 days after onset of symptoms.  Initial symptoms included increased sinus pressure and cough.  It quickly progressed to productive cough, with fever, chills, and sweats that have persisted for 1 week.  Patient also notes sharp pleuritic chest pain at lower right rib border that has now progressed towards bilateral rib pain radiating towards the front.  This pain is very sharp and worsens with cough.  Cough is productive with dark colored mucus.  Mucinex has improved patient's symptoms and ability to expectorate.  Today she notes more difficulty with coughing up her congestion.  It is also associated with some chest tightness that is relieved with albuterol. - Has been sleeping/resting frequently, but still feels very tired and fatigued - Hot showers/steam are relieving symptoms. -Patient has had no known sick contacts.  Patient had hip fracture and surgery on December 18, 2017.  She has not had any respiratory difficulties until 7 days ago.  She also does not note any lower leg swelling, redness, warmth, or any other signs of emboli.  - Has known nodule in left lung and did not change with MRI in past.  It was noted on chest x-ray 12/18/2017. Social History   Tobacco Use  . Smoking status: Former Smoker    Last attempt to quit: 07/19/2014    Years since quitting: 3.5  . Smokeless tobacco: Never Used  Substance Use Topics  . Alcohol use: Yes  . Drug use: No    Review of Systems Per HPI unless specifically indicated above     Objective:    BP 135/70 (BP Location: Right Arm, Patient Position: Sitting, Cuff Size: Normal)    Pulse 82   Temp 97.9 F (36.6 C) (Oral)   Resp 20   Ht 5\' 2"  (1.575 m)   Wt 110 lb 9.6 oz (50.2 kg)   SpO2 100%   BMI 20.23 kg/m   Wt Readings from Last 3 Encounters:  01/31/18 110 lb 9.6 oz (50.2 kg)  12/27/17 115 lb (52.2 kg)  12/18/17 108 lb (49 kg)    Physical Exam  Constitutional: She appears well-developed and well-nourished. She appears distressed (mildly).  HENT:  Head: Normocephalic and atraumatic.  Right Ear: Hearing, tympanic membrane, external ear and ear canal normal.  Left Ear: Hearing, tympanic membrane, external ear and ear canal normal.  Nose: Mucosal edema and rhinorrhea present. Right sinus exhibits maxillary sinus tenderness and frontal sinus tenderness. Left sinus exhibits maxillary sinus tenderness and frontal sinus tenderness.  Mouth/Throat: Uvula is midline and mucous membranes are normal. Posterior oropharyngeal edema (cobblestoning) present. Oropharyngeal exudate: clear secretions.  Eyes: Pupils are equal, round, and reactive to light. Conjunctivae and EOM are normal.  Neck: Normal range of motion. Neck supple.  Cardiovascular: Normal rate, regular rhythm, S1 normal, S2 normal, normal heart sounds and intact distal pulses.  Pulmonary/Chest: Effort normal. No respiratory distress. She has wheezes (mild inspiratory wheezes throughout). She has rhonchi in the right middle field and the right lower field. She has rales.  Lymphadenopathy:    She has cervical adenopathy.  Neurological: She is alert.  Skin: Skin  is warm and dry. She is not diaphoretic.  Psychiatric: She has a normal mood and affect. Her behavior is normal.  Vitals reviewed.   01/31/18 - Chest Xray - Personal review of images reveals no evidence of pneumonia. Appears stable from prior xray. Will await final radiology report.     Assessment & Plan:   Problem List Items Addressed This Visit    None    Visit Diagnoses    Fever and chills    -  Primary   Relevant Orders   POCT Influenza A/B     Lower respiratory infection (e.g., bronchitis, pneumonia, pneumonitis, pulmonitis)       Relevant Medications   azithromycin (ZITHROMAX) 250 MG tablet   benzonatate (TESSALON) 100 MG capsule   Other Relevant Orders   DG Chest 2 View     Presumed CAP in otherwise healthy older adult. May have evolved from prolonged viral URI / bronchitis. 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 History of COPD but only with mild acute wheezing and moderate response to albuterol. Considered unlikely PE given history of orthopedic surgery involving hip - ([facility] 0 since surgery > 4 weeks ago). - Afebrile, normal HR, no hypoxia (100% on RA)  Plan: 1. Start empiric antibiotics with Azithromycin PO 500mg  (day 1) then 250mg  daily x 4 days 2. Recommend improve hydration and add Mucinex-DM twice daily for up to 5-7 days if sputum still thick 3. Tessalon Perles for cough TID PRN 4. Tylenol, Ibuprofen PRN fevers 5. Chest Xray today  6. RTC within 1 week if not improving, otherwise strict return criteria to go to ED  Meds ordered this encounter  Medications  . azithromycin (ZITHROMAX) 250 MG tablet    Sig: Take one tablet twice today.  Then, take 1 tablet daily for 4 days.    Dispense:  6 tablet    Refill:  0    Order Specific Question:   Supervising Provider    Answer:   Olin Hauser [[id number]]  . benzonatate (TESSALON) 100 MG capsule    Sig: Take 1 capsule (100 mg total) by mouth 3 (three) times daily as needed for cough.    Dispense:  30 capsule    Refill:  0    Order Specific Question:   Supervising Provider    Answer:   Olin Hauser [[id number]]   Follow up plan: Return 5-7 days if symptoms worsen or fail to improve.  Cassell Smiles, DNP, AGPCNP-BC Adult Gerontology Primary Care Nurse Practitioner [facility] 01/31/2018, 9:43 AM

## 2018-02-04 ENCOUNTER — Encounter: Payer: Self-pay | Admitting: Nurse Practitioner

## 2018-02-07 DIAGNOSIS — Z96649 Presence of unspecified artificial hip joint: Secondary | ICD-10-CM | POA: Insufficient documentation

## 2018-03-14 ENCOUNTER — Other Ambulatory Visit: Payer: Self-pay | Admitting: Nurse Practitioner

## 2018-03-14 DIAGNOSIS — M792 Neuralgia and neuritis, unspecified: Secondary | ICD-10-CM

## 2018-03-28 ENCOUNTER — Ambulatory Visit (INDEPENDENT_AMBULATORY_CARE_PROVIDER_SITE_OTHER): Payer: Medicare Other | Admitting: Nurse Practitioner

## 2018-03-28 ENCOUNTER — Other Ambulatory Visit: Payer: Self-pay

## 2018-03-28 ENCOUNTER — Other Ambulatory Visit: Payer: Self-pay | Admitting: Nurse Practitioner

## 2018-03-28 ENCOUNTER — Encounter: Payer: Self-pay | Admitting: Nurse Practitioner

## 2018-03-28 VITALS — BP 140/65 | HR 75 | Temp 97.5°F | Ht 62.0 in | Wt 108.4 lb

## 2018-03-28 DIAGNOSIS — I10 Essential (primary) hypertension: Secondary | ICD-10-CM | POA: Diagnosis not present

## 2018-03-28 DIAGNOSIS — M1991 Primary osteoarthritis, unspecified site: Secondary | ICD-10-CM | POA: Diagnosis not present

## 2018-03-28 DIAGNOSIS — B070 Plantar wart: Secondary | ICD-10-CM | POA: Diagnosis not present

## 2018-03-28 DIAGNOSIS — M792 Neuralgia and neuritis, unspecified: Secondary | ICD-10-CM

## 2018-03-28 MED ORDER — DICLOFENAC SODIUM 75 MG PO TBEC: 75.0000 mg | DELAYED_RELEASE_TABLET | Freq: Two times a day (BID) | ORAL | 1 refills | Status: DC | PRN

## 2018-03-28 MED ORDER — DILTIAZEM HCL ER 180 MG PO CP24: 180.0000 mg | ORAL_CAPSULE | Freq: Every day | ORAL | 1 refills | Status: DC

## 2018-03-28 MED ORDER — LISINOPRIL 20 MG PO TABS
20.0000 mg | ORAL_TABLET | Freq: Every day | ORAL | 3 refills | Status: DC
Start: 2018-03-28 — End: 2018-09-29

## 2018-03-28 MED ORDER — GABAPENTIN 100 MG PO CAPS: ORAL_CAPSULE | ORAL | 2 refills | Status: DC

## 2018-03-28 NOTE — Progress Notes (Signed)
Subjective:    Patient ID: Alexis Owens, female    DOB: 1941/08/18, 77 y.o.   MRN: [id number]  Alexis Owens is a 77 y.o. female presenting on 03/28/2018 for Hypertension   HPI Right foot pain Patient has had painful area at MTP joints across plantar surface of right foot near the third metatarsal since shortly after her fall in February 2019.  She believes she may have either a plantar wart or possible splinter under the skin. This is causing significant pain with walking, which was already more difficult after left hip fracture and recovery.  She reports she cannot walk with bare feet.  Today she is wearing flip-flops.   She has treated at home with plantar wart removal over-the-counter medication.  This has not resolved her pain or lesion.    Osteoarthritis/rheumatoid arthritis/chronic pain Patient continues to have "all over body pain" that is not relieved by ibuprofen.  Patient requests Tylenol 3 today as this has helped in the past with her pain.  She declines to seek management at pain management clinic.  She continues to follow with rheumatology every 6 months and remains on methotrexate.  Pain is limiting her ability to stay as active as she desires.  She states she enjoys gardening and this pain limits her ability to do as much as she would like.  It does not prevent her from completing tasks, but she must stop to take rest breaks more frequently. -For neuropathic pain, patient continues to get relief with gabapentin 100 mg tablet.  She takes 1 tablet twice daily and an additional 3 tablets at bedtime.  Hypertension - She is checking BP at home or outside of clinic.  Readings 120-140/70-80 with elevations at periods of more intense pain - Current medications: lisinopril, diltiazem, tolerating well without side effects - She is not currently symptomatic. - Pt denies headache, lightheadedness, dizziness, changes in vision, chest tightness/pressure, palpitations, leg swelling, sudden  loss of speech or loss of consciousness. - She  reports an exercise routine that includes gardening 5-6 days per week. - Her diet is moderate in salt, moderate in fat, and moderate in carbohydrates.   Social History   Tobacco Use  . Smoking status: Former Smoker    Last attempt to quit: 07/19/2014    Years since quitting: 3.6  . Smokeless tobacco: Never Used  Substance Use Topics  . Alcohol use: Yes  . Drug use: No    Review of Systems Per HPI unless specifically indicated above     Objective:    BP 140/65 (BP Location: Right Arm, Patient Position: Sitting, Cuff Size: Small)   Pulse 75   Temp (!) 97.5 F (36.4 C) (Oral)   Ht 5\' 2"  (1.575 m)   Wt 108 lb 6.4 oz (49.2 kg)   BMI 19.83 kg/m   Wt Readings from Last 3 Encounters:  03/28/18 108 lb 6.4 oz (49.2 kg)  01/31/18 110 lb 9.6 oz (50.2 kg)  12/27/17 115 lb (52.2 kg)    Physical Exam  Constitutional: She is oriented to person, place, and time. She appears well-developed and well-nourished. No distress.  Neck: Normal range of motion. Neck supple. Carotid bruit is not present.  Cardiovascular: Normal rate, regular rhythm, S1 normal, S2 normal, normal heart sounds and intact distal pulses.  Pulses:      Dorsalis pedis pulses are 2+ on the right side, and 2+ on the left side.       Posterior tibial pulses are 2+ on the right  side, and 2+ on the left side.  Pulmonary/Chest: Effort normal and breath sounds normal. No respiratory distress.  Musculoskeletal: She exhibits no edema (pedal).       Right foot: There is normal range of motion.       Left foot: There is normal range of motion.  Feet:  Right Foot:  Skin Integrity: Positive for callus (callus developing around lesion c/w plantar wart located at 3rd mtp joint on plantar surface). Negative for ulcer, blister, skin breakdown, erythema, warmth or dry skin.  Left Foot:  Skin Integrity: Negative for ulcer, blister, skin breakdown, erythema, warmth, callus or dry skin.    Neurological: She is alert and oriented to person, place, and time.  Skin: Skin is warm and dry.  Psychiatric: She has a normal mood and affect. Her behavior is normal.  Vitals reviewed.    Results for orders placed or performed during the hospital encounter of 12/18/17  MRSA PCR Screening  Result Value Ref Range   MRSA by PCR NEGATIVE NEGATIVE  CBC with Differential  Result Value Ref Range   WBC 10.8 3.6 - 11.0 K/uL   RBC 3.69 (L) 3.80 - 5.20 MIL/uL   Hemoglobin 12.7 12.0 - 16.0 g/dL   HCT 37.3 35.0 - 47.0 %   MCV 101.0 (H) 80.0 - 100.0 fL   MCH 34.5 (H) 26.0 - 34.0 pg   MCHC 34.1 32.0 - 36.0 g/dL   RDW 13.3 11.5 - 14.5 %   Platelets 170 150 - 440 K/uL   Neutrophils Relative % 77 %   Neutro Abs 8.4 (H) 1.4 - 6.5 K/uL   Lymphocytes Relative 15 %   Lymphs Abs 1.6 1.0 - 3.6 K/uL   Monocytes Relative 6 %   Monocytes Absolute 0.7 0.2 - 0.9 K/uL   Eosinophils Relative 1 %   Eosinophils Absolute 0.1 0 - 0.7 K/uL   Basophils Relative 1 %   Basophils Absolute 0.1 0 - 0.1 K/uL  Comprehensive metabolic panel  Result Value Ref Range   Sodium 137 135 - 145 mmol/L   Potassium 4.5 3.5 - 5.1 mmol/L   Chloride 105 101 - 111 mmol/L   CO2 23 22 - 32 mmol/L   Glucose, Bld 99 65 - 99 mg/dL   BUN 17 6 - 20 mg/dL   Creatinine, Ser 0.69 0.44 - 1.00 mg/dL   Calcium 9.2 8.9 - 10.3 mg/dL   Total Protein 7.1 6.5 - 8.1 g/dL   Albumin 4.4 3.5 - 5.0 g/dL   AST 34 15 - 41 U/L   ALT 19 14 - 54 U/L   Alkaline Phosphatase 45 38 - 126 U/L   Total Bilirubin 0.6 0.3 - 1.2 mg/dL   GFR calc non Af Amer >60 >60 mL/min   GFR calc Af Amer >60 >60 mL/min   Anion gap 9 5 - 15  Troponin I  Result Value Ref Range   Troponin I <0.03 <0.03 ng/mL  Urinalysis, Complete w Microscopic  Result Value Ref Range   Color, Urine YELLOW (A) YELLOW   APPearance HAZY (A) CLEAR   Specific Gravity, Urine 1.010 1.005 - 1.030   pH 5.0 5.0 - 8.0   Glucose, UA NEGATIVE NEGATIVE mg/dL   Hgb urine dipstick NEGATIVE  NEGATIVE   Bilirubin Urine NEGATIVE NEGATIVE   Ketones, ur NEGATIVE NEGATIVE mg/dL   Protein, ur NEGATIVE NEGATIVE mg/dL   Nitrite NEGATIVE NEGATIVE   Leukocytes, UA SMALL (A) NEGATIVE   RBC / HPF 0-5 0 - 5  RBC/hpf   WBC, UA 0-5 0 - 5 WBC/hpf   Bacteria, UA RARE (A) NONE SEEN   Squamous Epithelial / LPF 6-30 (A) NONE SEEN  Hemoglobin A1c  Result Value Ref Range   Hgb A1c MFr Bld 5.6 4.8 - 5.6 %   Mean Plasma Glucose 114.02 mg/dL  TSH  Result Value Ref Range   TSH 1.511 0.350 - 4.500 uIU/mL  Glucose, capillary  Result Value Ref Range   Glucose-Capillary 92 65 - 99 mg/dL  Glucose, capillary  Result Value Ref Range   Glucose-Capillary 101 (H) 65 - 99 mg/dL  CBC  Result Value Ref Range   WBC 8.1 3.6 - 11.0 K/uL   RBC 3.24 (L) 3.80 - 5.20 MIL/uL   Hemoglobin 11.3 (L) 12.0 - 16.0 g/dL   HCT 33.1 (L) 35.0 - 47.0 %   MCV 102.0 (H) 80.0 - 100.0 fL   MCH 34.7 (H) 26.0 - 34.0 pg   MCHC 34.1 32.0 - 36.0 g/dL   RDW 13.5 11.5 - 14.5 %   Platelets 142 (L) 150 - 440 K/uL  Glucose, capillary  Result Value Ref Range   Glucose-Capillary 128 (H) 65 - 99 mg/dL  Basic metabolic panel  Result Value Ref Range   Sodium 140 135 - 145 mmol/L   Potassium 5.0 3.5 - 5.1 mmol/L   Chloride 108 101 - 111 mmol/L   CO2 26 22 - 32 mmol/L   Glucose, Bld 113 (H) 65 - 99 mg/dL   BUN 12 6 - 20 mg/dL   Creatinine, Ser 0.76 0.44 - 1.00 mg/dL   Calcium 8.4 (L) 8.9 - 10.3 mg/dL   GFR calc non Af Amer >60 >60 mL/min   GFR calc Af Amer >60 >60 mL/min   Anion gap 6 5 - 15  Glucose, capillary  Result Value Ref Range   Glucose-Capillary 122 (H) 65 - 99 mg/dL   Comment 1 Notify RN   Glucose, capillary  Result Value Ref Range   Glucose-Capillary 110 (H) 65 - 99 mg/dL   Comment 1 Notify RN   Glucose, capillary  Result Value Ref Range   Glucose-Capillary 106 (H) 65 - 99 mg/dL   Comment 1 Notify RN   Basic metabolic panel  Result Value Ref Range   Sodium 138 135 - 145 mmol/L   Potassium 3.8 3.5 - 5.1  mmol/L   Chloride 107 101 - 111 mmol/L   CO2 25 22 - 32 mmol/L   Glucose, Bld 123 (H) 65 - 99 mg/dL   BUN 10 6 - 20 mg/dL   Creatinine, Ser 0.63 0.44 - 1.00 mg/dL   Calcium 8.4 (L) 8.9 - 10.3 mg/dL   GFR calc non Af Amer >60 >60 mL/min   GFR calc Af Amer >60 >60 mL/min   Anion gap 6 5 - 15  CBC  Result Value Ref Range   WBC 8.8 3.6 - 11.0 K/uL   RBC 3.20 (L) 3.80 - 5.20 MIL/uL   Hemoglobin 11.1 (L) 12.0 - 16.0 g/dL   HCT 32.6 (L) 35.0 - 47.0 %   MCV 102.0 (H) 80.0 - 100.0 fL   MCH 34.7 (H) 26.0 - 34.0 pg   MCHC 34.0 32.0 - 36.0 g/dL   RDW 13.5 11.5 - 14.5 %   Platelets 143 (L) 150 - 440 K/uL      Assessment & Plan:   Problem List Items Addressed This Visit      Cardiovascular and Mediastinum   Hypertension Controlled blood pressure  today on check at goal despite higher level of pain.  Goal blood pressure less than 140/90.  Goal is higher related to older adult with history of falls and fracture with osteoporosis.  Patient is taking medications and tolerating well without side effects.  Recent lab check shows normal kidney function.  Patient plans to have recheck of labs in June or July with rheumatology which will include repeat kidney function.  HR improved without bradycardia noted on exam.  Plan: 1. Continue diltiazem 180 mg once daily 2. Continue lisinopril 20 mg once daily 3. Recheck Kidney function at least every 6 months. 4. Work to improve pain management as below. 5. Followup 6 months.   Relevant Medications   diltiazem (DILACOR XR) 180 MG 24 hr capsule   lisinopril (PRINIVIL,ZESTRIL) 20 MG tablet     Other   Neuropathic pain Stable on gabapentin 100 mg tablet with modest pain control.  Take one in am, one in afternoon, and three tablets at bedtime.   - Continue medication without change.  Refill provided.   Relevant Medications   gabapentin (NEURONTIN) 100 MG capsule    Other Visit Diagnoses    Plantar wart of right foot    -  Primary Likely plantar wart  remaining and refractory to OTC home treatment of lesion.  No identified foreign object.  Pt with poorly fitting and poorly supportive shoes today.  Plan: 1. Referral podiatry for removal/excision of plantar wart if determined necessary. 2. Encouraged pt to wear well-fitting shoes at all times. 3. Followup as needed.   Relevant Orders   Ambulatory referral to Podiatry   Primary osteoarthritis, unspecified site     Chronic arthritis pain worsening with moderate intensity activity.  Pt is currently concerned for use of ibuprofen and her history of peptic ulcer disease.    Plan: 1. Discussed pain management.  Pt declines.  Discussed possible use of Tramadol via PCP office, but described STOP act requirement of 5 day initial prescription and 30 day followup for chronic prescription after that time.   Pt declines, cites she doesn't want to be bothered to come to the doctor that frequently.  She would rather live with more pain. 2. Pt opts to try Rx NSAID intermittently.  Discussed may be more effective than ibuprofen for pain control with less medication.  START diclofenac 75 mg bid prn.  Take with food and only as needed.  Pt verbalizes understanding. 3. Followup 6 months or sooner if needed.  Recheck Kidney function in June/July with Rheumatology.   Relevant Medications   diclofenac (VOLTAREN) 75 MG EC tablet        Meds ordered this encounter  Medications  . diclofenac (VOLTAREN) 75 MG EC tablet    Sig: Take 1 tablet (75 mg total) by mouth 2 (two) times daily as needed for moderate pain.    Dispense:  60 tablet    Refill:  1    Order Specific Question:   Supervising Provider    Answer:   Olin Hauser [[id number]]  . diltiazem (DILACOR XR) 180 MG 24 hr capsule    Sig: Take 1 capsule (180 mg total) by mouth daily.    Dispense:  90 capsule    Refill:  1    Order Specific Question:   Supervising Provider    Answer:   Olin Hauser [[id number]]  . lisinopril (PRINIVIL,ZESTRIL)  20 MG tablet    Sig: Take 1 tablet (20 mg total) by mouth daily.  Dispense:  90 tablet    Refill:  3    Order Specific Question:   Supervising Provider    Answer:   Olin Hauser [[id number]]  . gabapentin (NEURONTIN) 100 MG capsule    Sig: TAKE ONE CAPSULE BY MOUTH TWICE DAILY AS NEEDED, THEN TAKE 3 CAPSULES AT BEDTIME    Dispense:  450 capsule    Refill:  2    Order Specific Question:   Supervising Provider    Answer:   Olin Hauser [[id number]]    Follow up plan: Return in about 6 months (around 09/28/2018) for hypertension.  Cassell Smiles, DNP, AGPCNP-BC Adult Gerontology Primary Care Nurse Practitioner [facility] 03/28/2018, 10:30 AM

## 2018-03-28 NOTE — Patient Instructions (Addendum)
Alexis Owens,   Thank you for coming in to clinic today.  1. Continue all existing medications without changes.  2. Referral to podiatry for your Right foot.  They will call you.  3. START diclofenac 75 mg twice daily as needed for mild to moderate pain.  - AVOID all other NSADIS (Aspirin, Motrin/Advil, Aleeve) - You may continue to use Tylenol as needed.  Please schedule a follow-up appointment with Cassell Smiles, AGNP. Return in about 6 months (around 09/28/2018) for hypertension.  If you have any other questions or concerns, please feel free to call the clinic or send a message through [place]. You may also schedule an earlier appointment if necessary.  You will receive a survey after today's visit either digitally by e-mail or paper by [employer]. Your experiences and feedback matter to [place].  Please respond so we know how we are doing as we provide care for you.   Cassell Smiles, DNP, AGNP-BC Adult Gerontology Nurse Practitioner [place]

## 2018-04-18 ENCOUNTER — Encounter: Payer: Self-pay | Admitting: Podiatry

## 2018-04-18 ENCOUNTER — Ambulatory Visit: Payer: Medicare Other | Admitting: Podiatry

## 2018-04-18 DIAGNOSIS — B070 Plantar wart: Secondary | ICD-10-CM | POA: Diagnosis not present

## 2018-04-18 DIAGNOSIS — B353 Tinea pedis: Secondary | ICD-10-CM

## 2018-04-18 MED ORDER — CLOTRIMAZOLE-BETAMETHASONE 1-0.05 % EX CREA: 1.0000 "application " | TOPICAL_CREAM | Freq: Two times a day (BID) | CUTANEOUS | 1 refills | Status: DC

## 2018-04-19 ENCOUNTER — Other Ambulatory Visit: Payer: Self-pay | Admitting: Nurse Practitioner

## 2018-04-19 DIAGNOSIS — M810 Age-related osteoporosis without current pathological fracture: Secondary | ICD-10-CM

## 2018-04-21 NOTE — Progress Notes (Signed)
   Subjective: 77 year old female presenting today as a new patient with a chief complaint of a wart noted to the plantar aspect of the right midfoot that began about 3 months ago. She states the pain has improved from the initial onset but is still present. She states she has been shaving the wart and "picking at it" for treatment. Walking increases the pain. Patient is here for further evaluation and treatment.   Past Medical History:  Diagnosis Date  . Allergy   . Arthritis   . Bunion   . Chronic sinusitis   . High risk medication use   . Hyperlipidemia   . Hypertension   . Hypothyroidism   . Insomnia   . Macular degeneration   . Osteoporosis   . Paroxysmal A-fib ([place])   . Prediabetes   . Rheumatoid arthritis ([place])     Objective: Physical Exam General: The patient is alert and oriented x3 in no acute distress.  Dermatology: Hyperkeratotic skin lesion noted to the plantar aspect of the right foot approximately 1 cm in diameter. Pinpoint bleeding noted upon debridement. Pruritus to the interdigital areas to bilateral feet with hyperkeratosis. Skin is warm, dry and supple bilateral lower extremities. Negative for open lesions or macerations.  Vascular: Palpable pedal pulses bilaterally. No edema or erythema noted. Capillary refill within normal limits.  Neurological: Epicritic and protective threshold grossly intact bilaterally.   Musculoskeletal Exam: Pain on palpation to the note skin lesion.  Range of motion within normal limits to all pedal and ankle joints bilateral. Muscle strength 5/5 in all groups bilateral.   Assessment: #1 plantar wart right foot #2 interdigital tinea pedis bilateral    Plan of Care:  #1 Patient was evaluated. #2 Excisional debridement of the plantar wart lesion was performed using a chisel blade. Salinocaine was applied and the lesion was dressed with a dry sterile dressing. #3 Prescription for Lotrisone cream provided to patient.  #4  patient is to return to clinic as needed.   Edrick Kins, DPM [facility]  Dr. Edrick Kins, [address]                                        [place], [place] [address]                Office [phone]  Fax [phone]

## 2018-04-22 ENCOUNTER — Telehealth: Payer: Self-pay | Admitting: Nurse Practitioner

## 2018-04-22 DIAGNOSIS — K219 Gastro-esophageal reflux disease without esophagitis: Secondary | ICD-10-CM

## 2018-04-22 MED ORDER — OMEPRAZOLE 20 MG PO CPDR: 20.0000 mg | DELAYED_RELEASE_CAPSULE | Freq: Every day | ORAL | 3 refills | Status: DC

## 2018-04-22 NOTE — Telephone Encounter (Signed)
Pt  Called  requesting refill on Omeprazole called into [facility]  [place]

## 2018-05-06 ENCOUNTER — Encounter: Payer: Self-pay | Admitting: Nurse Practitioner

## 2018-05-09 ENCOUNTER — Encounter: Payer: Self-pay | Admitting: Nurse Practitioner

## 2018-05-09 ENCOUNTER — Ambulatory Visit (INDEPENDENT_AMBULATORY_CARE_PROVIDER_SITE_OTHER): Payer: Medicare Other | Admitting: Nurse Practitioner

## 2018-05-09 ENCOUNTER — Other Ambulatory Visit: Payer: Self-pay

## 2018-05-09 VITALS — BP 130/65 | HR 73 | Temp 98.1°F | Ht 62.0 in | Wt 108.2 lb

## 2018-05-09 DIAGNOSIS — N3001 Acute cystitis with hematuria: Secondary | ICD-10-CM | POA: Diagnosis not present

## 2018-05-09 DIAGNOSIS — R300 Dysuria: Secondary | ICD-10-CM

## 2018-05-09 DIAGNOSIS — R350 Frequency of micturition: Secondary | ICD-10-CM

## 2018-05-09 LAB — POCT URINALYSIS DIPSTICK
Bilirubin, UA: NEGATIVE
Glucose, UA: NEGATIVE
Ketones, UA: NEGATIVE
Nitrite, UA: NEGATIVE
Protein, UA: NEGATIVE
Spec Grav, UA: 1.015 (ref 1.010–1.025)
Urobilinogen, UA: 0.2 E.U./dL
pH, UA: 5 (ref 5.0–8.0)

## 2018-05-09 MED ORDER — NITROFURANTOIN MONOHYD MACRO 100 MG PO CAPS: 100.0000 mg | ORAL_CAPSULE | Freq: Two times a day (BID) | ORAL | 0 refills | Status: AC

## 2018-05-09 NOTE — Patient Instructions (Addendum)
[name],   Thank you for coming in to clinic today.  1. You have a UTI - START nitrofurantoin 100 mg capsule twice daily for 5 days  - Continue cranberry and azo as you need to.  Please schedule a follow-up appointment with Cassell Smiles, AGNP. Return if symptoms worsen or fail to improve.  If you have any other questions or concerns, please feel free to call the clinic or send a message through [place]. You may also schedule an earlier appointment if necessary.  You will receive a survey after today's visit either digitally by e-mail or paper by [employer]. Your experiences and feedback matter to [place].  Please respond so we know how we are doing as we provide care for you.   Cassell Smiles, DNP, AGNP-BC Adult Gerontology Nurse Practitioner [place]

## 2018-05-10 ENCOUNTER — Encounter: Payer: Self-pay | Admitting: Nurse Practitioner

## 2018-05-10 LAB — URINE CULTURE
MICRO NUMBER:: 90776517
SPECIMEN QUALITY:: ADEQUATE

## 2018-05-10 NOTE — Progress Notes (Signed)
Subjective:    Patient ID: Alexis Owens, female    DOB: Sep 20, 1941, 77 y.o.   MRN: [id number]  Alexis Owens is a 77 y.o. female presenting on 05/09/2018 for Urinary Tract Infection (durinary frequency w/ urinary pressure.  Pt currently taking Cranberry, AZO)   HPI UTI Pt presents today with 7 days of symptoms.  Symptoms include urinary frequency, increased pelvic/bladder pressure, burning with urination.  She denies fever, chills, sweats, flank pain, and back pain.  She has not had any recent UTI symptoms or antibiotic use. - She is getting symptom relief from pain with cranberry and AZO.  She is not having much relief from frequency.  She had some mild resolution then worsening over last 2 days.  Social History   Tobacco Use  . Smoking status: Former Smoker    Last attempt to quit: 07/19/2014    Years since quitting: 3.8  . Smokeless tobacco: Never Used  Substance Use Topics  . Alcohol use: Yes  . Drug use: No    Review of Systems Per HPI unless specifically indicated above     Objective:    BP 130/65 (BP Location: Right Arm, Patient Position: Sitting, Cuff Size: Small)   Pulse 73   Temp 98.1 F (36.7 C) (Oral)   Ht 5\' 2"  (1.575 m)   Wt 108 lb 3.2 oz (49.1 kg)   BMI 19.79 kg/m   Wt Readings from Last 3 Encounters:  05/09/18 108 lb 3.2 oz (49.1 kg)  03/28/18 108 lb 6.4 oz (49.2 kg)  01/31/18 110 lb 9.6 oz (50.2 kg)    Physical Exam  Constitutional: She is oriented to person, place, and time. She appears well-developed and well-nourished. No distress.  HENT:  Head: Normocephalic and atraumatic.  Cardiovascular: Normal rate, regular rhythm and intact distal pulses.  Abdominal: Soft. There is tenderness in the suprapubic area. There is no CVA tenderness.  Neurological: She is alert and oriented to person, place, and time.  Skin: Skin is warm and dry. Capillary refill takes less than 2 seconds.  Psychiatric: She has a normal mood and affect. Her behavior is normal.  Judgment and thought content normal.  Vitals reviewed.  Results for orders placed or performed in visit on 05/09/18  POCT Urinalysis Dipstick  Result Value Ref Range   Color, UA yellow    Clarity, UA cloudy    Glucose, UA Negative Negative   Bilirubin, UA negative    Ketones, UA negative    Spec Grav, UA 1.015 1.010 - 1.025   Blood, UA large    pH, UA 5.0 5.0 - 8.0   Protein, UA Negative Negative   Urobilinogen, UA 0.2 0.2 or 1.0 E.U./dL   Nitrite, UA negative    Leukocytes, UA Large (3+) (A) Negative   Appearance     Odor        Assessment & Plan:   Problem List Items Addressed This Visit    None    Visit Diagnoses    Urinary frequency    -  Primary   Relevant Medications   nitrofurantoin, macrocrystal-monohydrate, (MACROBID) 100 MG capsule   Other Relevant Orders   Urine Culture   Dysuria       Relevant Medications   nitrofurantoin, macrocrystal-monohydrate, (MACROBID) 100 MG capsule   Other Relevant Orders   POCT Urinalysis Dipstick (Completed)   Urine Culture   Acute cystitis with hematuria       Relevant Medications   nitrofurantoin, macrocrystal-monohydrate, (MACROBID) 100 MG capsule  Acute cystitis with hematuria.  Pt symptomatic currently with increased suprapubic pressure x 7 days. Currently without systemic signs or symptoms of infection.   - No current risk of concurrent STI.  Plan: 1. START Macrobid 100 mg 2 times daily for next 5 days.   - Send Urine culture 2. Provided non-pharm measures for UTI prevention for good hygiene. 3. Drink plenty of fluids and improve hydration over next 1 week. 4. Provided precautions for severe symptoms requiring ED visit to include no urine in 24-48 hours. 5. Followup 2-5 days as needed for worsening or persistent symptoms.    Meds ordered this encounter  Medications  . nitrofurantoin, macrocrystal-monohydrate, (MACROBID) 100 MG capsule    Sig: Take 1 capsule (100 mg total) by mouth 2 (two) times daily for 5  days.    Dispense:  10 capsule    Refill:  0    Order Specific Question:   Supervising Provider    Answer:   Olin Hauser [[id number]]    Follow up plan: Return if symptoms worsen or fail to improve.  Cassell Smiles, DNP, AGPCNP-BC Adult Gerontology Primary Care Nurse Practitioner [facility] 05/10/2018, 1:56 PM

## 2018-05-29 ENCOUNTER — Other Ambulatory Visit: Payer: Self-pay | Admitting: Nurse Practitioner

## 2018-05-29 DIAGNOSIS — M1991 Primary osteoarthritis, unspecified site: Secondary | ICD-10-CM

## 2018-06-09 ENCOUNTER — Other Ambulatory Visit: Payer: Self-pay | Admitting: Nurse Practitioner

## 2018-06-09 DIAGNOSIS — M1991 Primary osteoarthritis, unspecified site: Secondary | ICD-10-CM

## 2018-07-20 ENCOUNTER — Other Ambulatory Visit: Payer: Self-pay | Admitting: Nurse Practitioner

## 2018-07-20 DIAGNOSIS — E039 Hypothyroidism, unspecified: Secondary | ICD-10-CM

## 2018-08-14 DIAGNOSIS — M65331 Trigger finger, right middle finger: Secondary | ICD-10-CM | POA: Insufficient documentation

## 2018-08-23 ENCOUNTER — Other Ambulatory Visit: Payer: Self-pay | Admitting: Family Medicine

## 2018-08-23 DIAGNOSIS — M792 Neuralgia and neuritis, unspecified: Secondary | ICD-10-CM

## 2018-08-24 ENCOUNTER — Other Ambulatory Visit: Payer: Self-pay | Admitting: Nurse Practitioner

## 2018-08-24 DIAGNOSIS — M810 Age-related osteoporosis without current pathological fracture: Secondary | ICD-10-CM

## 2018-09-29 ENCOUNTER — Ambulatory Visit (INDEPENDENT_AMBULATORY_CARE_PROVIDER_SITE_OTHER): Payer: Medicare Other | Admitting: Nurse Practitioner

## 2018-09-29 ENCOUNTER — Other Ambulatory Visit: Payer: Self-pay

## 2018-09-29 ENCOUNTER — Encounter: Payer: Self-pay | Admitting: Nurse Practitioner

## 2018-09-29 VITALS — BP 132/74 | HR 69 | Temp 98.2°F | Ht 62.0 in | Wt 116.6 lb

## 2018-09-29 DIAGNOSIS — I10 Essential (primary) hypertension: Secondary | ICD-10-CM | POA: Diagnosis not present

## 2018-09-29 DIAGNOSIS — I480 Paroxysmal atrial fibrillation: Secondary | ICD-10-CM

## 2018-09-29 DIAGNOSIS — M810 Age-related osteoporosis without current pathological fracture: Secondary | ICD-10-CM

## 2018-09-29 DIAGNOSIS — L03116 Cellulitis of left lower limb: Secondary | ICD-10-CM | POA: Diagnosis not present

## 2018-09-29 DIAGNOSIS — E039 Hypothyroidism, unspecified: Secondary | ICD-10-CM

## 2018-09-29 MED ORDER — DILTIAZEM HCL ER COATED BEADS 120 MG PO CP24: 120.0000 mg | ORAL_CAPSULE | Freq: Every day | ORAL | 1 refills | Status: DC

## 2018-09-29 MED ORDER — DOXYCYCLINE HYCLATE 100 MG PO TABS: 100.0000 mg | ORAL_TABLET | Freq: Two times a day (BID) | ORAL | 0 refills | Status: DC

## 2018-09-29 NOTE — Assessment & Plan Note (Signed)
Currently well controlled on medications.  Pt w/o current symptoms, but reports fatigue regularly when taking diltiazem daily.  Patient taking about every other day and only when having palpitations.  Checks BP at home and is usually at goal of 130/80.   - Patient has history of afib and has RRR today.  Plan: 1. Continue lisinopril 20 mg once daily. Consider reducing in future if patient has fatigue and low BP with daily diltiazem. - DECREASE diltiazem 24 hr capsule from 180 to120 mg once daily.  Encouraged pt to take this daily as she has history of atrial fibrillation. 2. Pt declines anticoagulation for atrial fibrillation history.  Is taking aspirin 81 mg once daily. 3. Follow up 6 months.

## 2018-09-29 NOTE — Assessment & Plan Note (Signed)
Controlled.  RRR.  Pt w/o symptoms and on stable dose for several years, however does not take daily r/t dizziness.  Plan: 1. Reduce diltiazem as above. 2. Consult Cardiology if any changes. 3. Follow up 6 months.

## 2018-09-29 NOTE — Patient Instructions (Addendum)
[name],   Thank you for coming in to clinic today.  1. Take diltiazem daily to prevent palpitations, atrial fibrillation.  REDUCE dose to 120 mg once daily - If feeling tired, we may need a BP check and possible change in lisinopril dose. - Continue lisinopril 20 mg once daily.  2. Use your cane all the time to prevent falls.  3. TSH today - recheck thyroid function.  4. Your Bone Density order has been placed.  Call the Scheduling phone number at [phone] to schedule your test at your convenience.  You can choose to go to either location listed below.  Let the scheduler know which location you prefer. - This is due in February 2020.  [place]  [place], [place] [address]   [facility] [address] [place], [place] [address]  4. For your skin wounds: start doxycycline 100 mg twice daily for 10 days.  Please schedule a follow-up appointment with Cassell Smiles, AGNP. Return in about 6 months (around 03/30/2019) for hypertension, Osteoporosis.  If you have any other questions or concerns, please feel free to call the clinic or send a message through [facility]. You may also schedule an earlier appointment if necessary.  You will receive a survey after today's visit either digitally by e-mail or paper by [employer]. Your experiences and feedback matter to [place].  Please respond so we know how we are doing as we provide care for you.   Cassell Smiles, DNP, AGNP-BC Adult Gerontology Nurse Practitioner [facility]

## 2018-09-29 NOTE — Assessment & Plan Note (Signed)
Last check was well controlled.  Pt reports she has had improved symptoms after starting levothyroxine and desires to continue.   - Complications: atrial fibrillation, osteoporosis.  Plan: 1. Continue levothyroxine 25 mcg once daily. 2. Recheck TSH today. 3. Follow up 6 months.

## 2018-09-29 NOTE — Assessment & Plan Note (Signed)
No recent DEXA, patient continues on Fosamax and has had no new fractures in last 6 months other than fracture in 12/2017 which is now well healed.  Plan: 1. Continue Fosamax. 2. Repeat DEXA in February 2020.  Order placed.  Patient instructed to call and schedule. 3. Follow-up 6 months and after DEXA prn.

## 2018-09-29 NOTE — Progress Notes (Signed)
Subjective:    Patient ID: Alexis Owens, female    DOB: September 23, 1941, 77 y.o.   MRN: [id number]  Alexis Owens is a 77 y.o. female presenting on 09/29/2018 for Hypertension and Fall (pt fell over a rose bush x 2 weeks ago and injured her left ankle )   HPI Hypertension/atrial fibrillation - She is not checking BP at home or outside of clinic.    - Current medications: lisinopril 20 mg once daily, diltiazem 180 mg XR only 3 times per week when she feels palpitations, tolerating with side effects of fatigue with daily diltiazem - She is not currently symptomatic. - Pt denies headache, lightheadedness, dizziness, changes in vision, chest tightness/pressure, leg swelling, sudden loss of speech or loss of consciousness. - She  reports an exercise routine that includes activity around house with hobbies, 5 days per week. - Her diet is moderate in salt, moderate in fat, and moderate in carbohydrates.   Fall Scratch on Left ankle - Is concerned for infection. She tripped over a plastic garden edging and into a rosebush. The injury occurred 2 weeks ago and has been improving gradually since the injury.  Unfortunately, skin redness is not resolving.  Rheumatology - 2 visits since our last visit with change to [facility] methotrexate from PO, prednisone taper, injections to hands, daily folate.  Next visit 4 months.  Osteoporosis Patient continues taking Fosamax once weekly without difficulty.  Has had no other broken bones since hip fracture in February 2019.  Continues daily activity and regular calcium and vitamin D supplementation.  Hypothyroidism - Pt states she is taking her levothyroxine 39mcg in the am at least 1 hour before eating or drinking and taking other medicines.   - She is not currently symptomatic.  Reports improvement in mood with levothyroxine initiation several years ago and is hesitant to stop. - She denies fatigue, excess energy, weight changes, heart racing, heat and cold  intolerance, changes in hair/skin/nails, constipation, diarrhea, and lower leg swelling.  - She does not have any compressive symptoms to include difficulty swallowing, globus sensation, or difficulty breathing when lying flat.   Social History   Tobacco Use  . Smoking status: Former Smoker    Last attempt to quit: 07/19/2014    Years since quitting: 4.2  . Smokeless tobacco: Never Used  Substance Use Topics  . Alcohol use: Yes  . Drug use: No    Review of Systems Per HPI unless specifically indicated above     Objective:    BP 132/74 (BP Location: Left Arm, Patient Position: Sitting, Cuff Size: Normal)   Pulse 69   Temp 98.2 F (36.8 C) (Oral)   Ht 5\' 2"  (1.575 m)   Wt 116 lb 9.6 oz (52.9 kg)   BMI 21.33 kg/m   Wt Readings from Last 3 Encounters:  09/29/18 116 lb 9.6 oz (52.9 kg)  05/09/18 108 lb 3.2 oz (49.1 kg)  03/28/18 108 lb 6.4 oz (49.2 kg)    Physical Exam  Constitutional: She is oriented to person, place, and time. She appears well-developed and well-nourished. No distress.  HENT:  Head: Normocephalic and atraumatic.  Neck: Normal range of motion. Neck supple. No thyromegaly present.  Cardiovascular: Normal rate, regular rhythm, S1 normal, S2 normal, normal heart sounds and intact distal pulses.  Pulmonary/Chest: Effort normal and breath sounds normal. No respiratory distress.  Lymphadenopathy:    She has no cervical adenopathy.  Neurological: She is alert and oriented to person, place, and time.  Skin:  Skin is warm and dry. Capillary refill takes less than 2 seconds.     Lateral LEFT leg 1.5 cm round abscess, non-fluctuant and without drainage lateral approx 2 inches below knee.  Anterior shin 2 lesions approx 2 cm x 0.25 cm and 2 cm round (scabbed over) with erythema surrounding the lesions.  Psychiatric: She has a normal mood and affect. Her behavior is normal. Judgment and thought content normal.  Vitals reviewed.     Results for orders placed or  performed in visit on 05/09/18  Urine Culture  Result Value Ref Range   MICRO NUMBER: [id number]    SPECIMEN QUALITY: ADEQUATE    Sample Source URINE    STATUS: FINAL    Result:      Three or more organisms present, each greater than 10,000 cu/mL. May represent normal flora contamination from external genitalia. No further testing is required.  POCT Urinalysis Dipstick  Result Value Ref Range   Color, UA yellow    Clarity, UA cloudy    Glucose, UA Negative Negative   Bilirubin, UA negative    Ketones, UA negative    Spec Grav, UA 1.015 1.010 - 1.025   Blood, UA large    pH, UA 5.0 5.0 - 8.0   Protein, UA Negative Negative   Urobilinogen, UA 0.2 0.2 or 1.0 E.U./dL   Nitrite, UA negative    Leukocytes, UA Large (3+) (A) Negative   Appearance     Odor        Assessment & Plan:   Problem List Items Addressed This Visit      Cardiovascular and Mediastinum   Hypertension    Currently well controlled on medications.  Pt w/o current symptoms, but reports fatigue regularly when taking diltiazem daily.  Patient taking about every other day and only when having palpitations.  Checks BP at home and is usually at goal of 130/80.   - Patient has history of afib and has RRR today.  Plan: 1. Continue lisinopril 20 mg once daily. Consider reducing in future if patient has fatigue and low BP with daily diltiazem. - DECREASE diltiazem 24 hr capsule from 180 to120 mg once daily.  Encouraged pt to take this daily as she has history of atrial fibrillation. 2. Pt declines anticoagulation for atrial fibrillation history.  Is taking aspirin 81 mg once daily. 3. Follow up 6 months.      Relevant Medications   diltiazem (DILACOR XR) 180 MG 24 hr capsule   lisinopril (PRINIVIL,ZESTRIL) 20 MG tablet   diltiazem (CARDIZEM CD) 120 MG 24 hr capsule   Paroxysmal A-fib (HCC)    Controlled.  RRR.  Pt w/o symptoms and on stable dose for several years, however does not take daily r/t dizziness.  Plan: 1.  Reduce diltiazem as above. 2. Consult Cardiology if any changes. 3. Follow up 6 months.      Relevant Medications   diltiazem (DILACOR XR) 180 MG 24 hr capsule   lisinopril (PRINIVIL,ZESTRIL) 20 MG tablet   diltiazem (CARDIZEM CD) 120 MG 24 hr capsule     Endocrine   Hypothyroidism    Last check was well controlled.  Pt reports she has had improved symptoms after starting levothyroxine and desires to continue.   - Complications: atrial fibrillation, osteoporosis.  Plan: 1. Continue levothyroxine 25 mcg once daily. 2. Recheck TSH today. 3. Follow up 6 months.      Relevant Medications   levothyroxine (SYNTHROID, LEVOTHROID) 25 MCG tablet   Other Relevant Orders  TSH     Musculoskeletal and Integument   Osteoporosis, post-menopausal    No recent DEXA, patient continues on Fosamax and has had no new fractures in last 6 months other than fracture in 12/2017 which is now well healed.  Plan: 1. Continue Fosamax. 2. Repeat DEXA in February 2020.  Order placed.  Patient instructed to call and schedule. 3. Follow-up 6 months and after DEXA prn.      Relevant Orders   DG Bone Density    Other Visit Diagnoses    Cellulitis of left lower extremity    -  Primary   Relevant Medications   doxycycline (VIBRA-TABS) 100 MG tablet    Acute cellulitis after loss of full skin integrity, fall into rose bush with scrapes.  Small abscess lateral LEFT knee.  Plan: 1.  START doxycycline 100 mg bid x 10 days. 2. Reviewed signs and symptoms of complicated infection. 3. Encouraged Tdap, patient declines today - may get Td at pharmacy as patient is concerned about costs. 4. Follow-up 5-7 days if not improving.  Meds ordered this encounter  Medications  . doxycycline (VIBRA-TABS) 100 MG tablet    Sig: Take 1 tablet (100 mg total) by mouth 2 (two) times daily.    Dispense:  20 tablet    Refill:  0    Order Specific Question:   Supervising Provider    Answer:   Olin Hauser  [[id number]]  . diltiazem (CARDIZEM CD) 120 MG 24 hr capsule    Sig: Take 1 capsule (120 mg total) by mouth daily.    Dispense:  90 capsule    Refill:  1    Order Specific Question:   Supervising Provider    Answer:   Olin Hauser [[id number]]   Follow up plan: Return in about 6 months (around 03/30/2019) for hypertension, Osteoporosis.  Cassell Smiles, DNP, AGPCNP-BC Adult Gerontology Primary Care Nurse Practitioner [facility] 09/29/2018, 8:10 AM

## 2018-09-30 LAB — TSH: TSH: 1.15 mIU/L (ref 0.40–4.50)

## 2018-10-14 ENCOUNTER — Other Ambulatory Visit: Payer: Self-pay | Admitting: Nurse Practitioner

## 2018-10-14 DIAGNOSIS — E039 Hypothyroidism, unspecified: Secondary | ICD-10-CM

## 2018-10-17 ENCOUNTER — Other Ambulatory Visit: Payer: Self-pay | Admitting: Nurse Practitioner

## 2018-10-17 DIAGNOSIS — G4709 Other insomnia: Secondary | ICD-10-CM

## 2018-10-17 MED ORDER — TRAZODONE HCL 50 MG PO TABS: 50.0000 mg | ORAL_TABLET | Freq: Every day | ORAL | 0 refills | Status: DC

## 2018-10-17 NOTE — Telephone Encounter (Signed)
Refilled Trazodone for 30 day supply for now.  Nobie Putnam, [facility] 10/17/2018, 5:49 PM

## 2018-10-17 NOTE — Telephone Encounter (Signed)
Pt called requesting a refill on trazodone called into [facility]

## 2018-10-20 ENCOUNTER — Other Ambulatory Visit: Payer: Self-pay

## 2018-10-25 ENCOUNTER — Encounter: Payer: Self-pay | Admitting: Nurse Practitioner

## 2018-10-25 DIAGNOSIS — G4709 Other insomnia: Secondary | ICD-10-CM

## 2018-10-25 DIAGNOSIS — J309 Allergic rhinitis, unspecified: Secondary | ICD-10-CM

## 2018-10-27 MED ORDER — FLUTICASONE PROPIONATE 50 MCG/ACT NA SUSP: 2.0000 | Freq: Every day | NASAL | 11 refills | Status: DC

## 2018-10-27 MED ORDER — TRAZODONE HCL 50 MG PO TABS: 50.0000 mg | ORAL_TABLET | Freq: Every day | ORAL | 2 refills | Status: DC

## 2018-10-27 NOTE — Telephone Encounter (Signed)
Mychart message

## 2018-11-27 ENCOUNTER — Encounter: Payer: Self-pay | Admitting: Nurse Practitioner

## 2018-11-27 DIAGNOSIS — G4709 Other insomnia: Secondary | ICD-10-CM

## 2018-11-27 MED ORDER — TRAZODONE HCL 50 MG PO TABS: 50.0000 mg | ORAL_TABLET | Freq: Every day | ORAL | 1 refills | Status: DC

## 2018-11-27 NOTE — Telephone Encounter (Signed)
Please advise 

## 2019-03-03 ENCOUNTER — Encounter: Payer: Self-pay | Admitting: Nurse Practitioner

## 2019-03-03 ENCOUNTER — Other Ambulatory Visit: Payer: Self-pay | Admitting: Nurse Practitioner

## 2019-03-05 ENCOUNTER — Encounter: Payer: Self-pay | Admitting: Nurse Practitioner

## 2019-03-10 ENCOUNTER — Telehealth: Payer: Medicare Other | Admitting: Physician Assistant

## 2019-03-10 DIAGNOSIS — R300 Dysuria: Secondary | ICD-10-CM

## 2019-03-10 MED ORDER — NITROFURANTOIN MONOHYD MACRO 100 MG PO CAPS: 100.0000 mg | ORAL_CAPSULE | Freq: Two times a day (BID) | ORAL | 0 refills | Status: AC

## 2019-03-10 NOTE — Progress Notes (Signed)
We are sorry that you are not feeling well.  Here is how we plan to help!  Based on what you shared with me it looks like you most likely have a simple urinary tract infection.  A UTI (Urinary Tract Infection) is a bacterial infection of the bladder.  Most cases of urinary tract infections are simple to treat but a key part of your care is to encourage you to drink plenty of fluids and watch your symptoms carefully.  I have prescribed MacroBid 100 mg twice a day for 5 days.  Your symptoms should gradually improve. Call us if the burning in your urine worsens, you develop worsening fever, back pain or pelvic pain or if your symptoms do not resolve after completing the antibiotic.  Urinary tract infections can be prevented by drinking plenty of water to keep your body hydrated.  Also be sure when you wipe, wipe from front to back and don't hold it in!  If possible, empty your bladder every 4 hours.  Your e-visit answers were reviewed by a board certified advanced clinical practitioner to complete your personal care plan.  Depending on the condition, your plan could have included both over the counter or prescription medications.  If there is a problem please reply once you have received a response from your provider.  Your safety is important to [place].  If you have drug allergies check your prescription carefully.    You can use MyChart to ask questions about today's visit, request a non-urgent call back, or ask for a work or school excuse for 24 hours related to this e-Visit. If it has been greater than 24 hours you will need to follow up with your provider, or enter a new e-Visit to address those concerns.   You will get an e-mail in the next two days asking about your experience.  I hope that your e-visit has been valuable and will speed your recovery. Thank you for using e-visits.    ===View-only below this line===   ----- Message -----    From: [name]    Sent: 03/10/2019  2:49  PM EDT      To: E-Visit Mailing List Subject: E-Visit Submission: Urinary Problems  E-Visit Submission: Urinary Problems --------------------------------  Question: Which of the following are you experiencing? Answer:   Pain while passing urine  Question: When you have pain when passing urine, which of these apply? Answer:   I have a burning sensation  Question: Are you able to pass urine? Answer:   Yes, I can pass urine.  Question: How long have you had pain or difficulty passing urine? Answer:   Two days or less  Question: Do you have a fever? Answer:   No, I do not have a fever  Question: Do you have any of the following? Answer:   I have none of these problems  Question: Do you have an exaggerated sensation of the need to pass urine? Answer:   Yes, the sensation is exaggerated  Question: Do you have the urge to urinate more of less frequently than normal? Answer:   More frequently  Question: What does your urine look like? Answer:   I am not sure  Question: Do you have any of the following? Answer:   None of the above  Question: Do you have any of the following? Answer:   No discharge  Question: Do you have any sores on your genitals? Answer:   No  Question: Do you have any history  of kidney dysfunction or kidney problems? Answer:   No  Question: Within the past 3 months, have you had any surgery on your kidneys or bladder, or have you had a tube inserted to collect your urine? Answer:   No, I have never had either  Question: Have you had similar symptoms in the past? Answer:   Yes, I have had similar symptoms before  Question: If you had similar symptoms in the past, did any of the following work? Answer:   Pills for urine infection            Cranberry juice  Question: Please list any additional comments  Answer:   I AM TRYING TO CATCH IT BEFORE IT GETS WORSE. I AM ALLERGIC TO SULPHA DRUGS AND WHEN dR. KENNEDY TREATED ME LONG TIME AGO I TOOK mACRIBID  WITH FOOD BID FOR A WEEK (I THINK) I HAVE STARTED TAKING azo  OTC.  Marland KitchencAN YOU HELP BEFORE IT GETS WORSE?  Question: Please list your medication allergies that you may have ? (If 'none' , please list as 'none') Answer:   penicillin AND SULFA  A total of 5-10 minutes was spent evaluating this patients questionnaire and formulating a plan of care.

## 2019-03-18 ENCOUNTER — Other Ambulatory Visit: Payer: Self-pay | Admitting: Nurse Practitioner

## 2019-03-18 DIAGNOSIS — M810 Age-related osteoporosis without current pathological fracture: Secondary | ICD-10-CM

## 2019-03-30 ENCOUNTER — Ambulatory Visit: Payer: Medicare Other | Admitting: Nurse Practitioner

## 2019-04-03 ENCOUNTER — Ambulatory Visit: Payer: Medicare Other | Admitting: Nurse Practitioner

## 2019-04-13 ENCOUNTER — Other Ambulatory Visit: Payer: Self-pay | Admitting: Nurse Practitioner

## 2019-04-13 DIAGNOSIS — K219 Gastro-esophageal reflux disease without esophagitis: Secondary | ICD-10-CM

## 2019-04-24 ENCOUNTER — Telehealth: Payer: Self-pay | Admitting: *Deleted

## 2019-04-24 ENCOUNTER — Encounter: Payer: Self-pay | Admitting: Emergency Medicine

## 2019-04-24 ENCOUNTER — Other Ambulatory Visit: Payer: Self-pay

## 2019-04-24 ENCOUNTER — Ambulatory Visit (INDEPENDENT_AMBULATORY_CARE_PROVIDER_SITE_OTHER): Payer: Medicare Other

## 2019-04-24 ENCOUNTER — Ambulatory Visit
Admission: EM | Admit: 2019-04-24 | Discharge: 2019-04-24 | Disposition: A | Discharge status: Home / Self Care | Payer: Medicare Other | Attending: Emergency Medicine | Admitting: Emergency Medicine

## 2019-04-24 DIAGNOSIS — R0789 Other chest pain: Secondary | ICD-10-CM | POA: Diagnosis not present

## 2019-04-24 DIAGNOSIS — B349 Viral infection, unspecified: Secondary | ICD-10-CM

## 2019-04-24 DIAGNOSIS — J029 Acute pharyngitis, unspecified: Secondary | ICD-10-CM | POA: Diagnosis not present

## 2019-04-24 DIAGNOSIS — Z20822 Contact with and (suspected) exposure to covid-19: Secondary | ICD-10-CM

## 2019-04-24 DIAGNOSIS — Z7189 Other specified counseling: Secondary | ICD-10-CM

## 2019-04-24 LAB — RAPID STREP SCREEN (MED CTR MEBANE ONLY): Streptococcus, Group A Screen (Direct): NEGATIVE

## 2019-04-24 NOTE — Telephone Encounter (Signed)
Marylene Land, NP  [facility]   Cc: Sandria Manly, RN        Covid 19 testing    Call to patient- left message to call back for COVID testing. Order has been placed.

## 2019-04-24 NOTE — Discharge Instructions (Addendum)
Tylenol as needed. Rest. Drink plenty of fluids. Monitor self closely. Covid-19 testing. Refer to [facility] information and guidelines. Remain home until further notified.  Follow up with your primary care physician in 2 days for follow up. Return to Urgent care or Emergency room for new or worsening concerns.

## 2019-04-24 NOTE — Telephone Encounter (Signed)
Call to patient- for follow up- she states they where out and went by test site and have been tested.

## 2019-04-24 NOTE — ED Triage Notes (Signed)
Pt c/o sore throat and fever (as high as 103) she did have a tick bite but was several weeks ago. She has also had nausea, headache, weakness, chest heaviness and body aches started about 4 days ago. Pt last took tylenol about 4 am this morning. Pt states that she had a RA flare before COVID and was similar to this.

## 2019-04-24 NOTE — ED Provider Notes (Signed)
[facility] ____________________________________________  Time seen: Approximately 9:42 AM  I have reviewed the triage vital signs and the nursing notes.   HISTORY  Chief Complaint Sore Throat and Fever   HPI Alexis Owens is a 78 y.o. female history of hypertension, hyperlipidemia, paroxysmal A. fib, RA, presenting for evaluation of sore throat, fever and intermittent nausea and headache.  Patient reports symptoms started 4 days ago initially with a sore throat and then sore throat resolved but onset of fever with accompanying headaches.  States T-max 103.  Has been taking Tylenol intermittently, last took at 4 AM this morning.  States the past 2 days she has been mostly laying around as she did not feel well, but reports today she is starting to feel better.  States she has her chronic cough at night without change.  Denies nasal congestion.  Continues to drink fluids well, decreased appetite.  Did have some nausea and vomiting initially.  Occasional chest and generalized heaviness.  Denies chest pain, shortness of breath, extremity edema, syncope or near syncope.  Denies any dysuria symptoms and states this does not feel UTI and declines urinary testing.  Immunocompromised on chronic methotrexate.  Denies known sick contacts.  Patient does report she had a tick bite several weeks ago to her left forearm, but reports healing well and no rash.  Again states today she is feeling better.   Past Medical History:  Diagnosis Date  . Allergy   . Arthritis   . Bunion   . Chronic sinusitis   . High risk medication use   . Hyperlipidemia   . Hypertension   . Hypothyroidism   . Insomnia   . Macular degeneration   . Osteoporosis   . Paroxysmal A-fib ([place])   . Prediabetes   . Rheumatoid arthritis [facility])     Patient Active Problem List   Diagnosis Date Noted  . S/P hip hemiarthroplasty 02/07/2018  . 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 Encounter for  long-term (current) use of high-risk medication 10/14/2017  . 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 Hypertension 07/24/2017  . Paroxysmal A-fib ([place]) 07/24/2017  . Hypothyroidism 07/24/2017  . Rheumatoid arthritis ([place]) 07/24/2017  . High risk medication use 07/24/2017  . Prediabetes 07/24/2017  . Insomnia 07/24/2017  . Macular degeneration 07/24/2017  . Hyperlipidemia 07/24/2017  . Allergic rhinitis 07/24/2017  . Age-related osteoporosis with current pathol fracture of left femur, sequela 07/24/2017  . Neuropathic pain 07/24/2017  . Other chronic pain 07/24/2017    Past Surgical History:  Procedure Laterality Date  . APPENDECTOMY    . 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 LEG SURGERY     Rt leg surgery for Compound fracture of the Right tibia      No current facility-administered medications for this encounter.   Current Outpatient Medications:  .  alendronate (FOSAMAX) 70 MG tablet, TAKE 1 TABLET BY MOUTH ONCE A WEEK, WITH A FULL GLASS OF WATER ON AN EMPTY STOMACH, REMAIN UPRIGHT FOR 30 MINUTES, Disp: 12 tablet, Rfl: 0 .  diltiazem (CARDIZEM CD) 120 MG 24 hr capsule, Take 1 capsule (120 mg total) by mouth daily., Disp: 90 capsule, Rfl: 1 .  folic acid (FOLVITE) 1 MG tablet, Take 1 mg by mouth daily., Disp: , Rfl:  .  gabapentin (NEURONTIN) 100 MG capsule, TAKE  1 CAPSULE BY MOUTH TWICE DAILY AS NEEDED THEN TAKE 3 CAPSULES BY MOUTH AT BEDTIME, Disp: 450 capsule, Rfl: 0 .  hydroxychloroquine (PLAQUENIL) 200 MG tablet, Take 300 mg daily (1 tab and a half ) daily, 90 days, Disp: , Rfl:  .  levothyroxine (SYNTHROID, LEVOTHROID) 25 MCG tablet, TAKE 1 TABLET(25 MCG) BY MOUTH DAILY BEFORE BREAKFAST, Disp: 90 tablet, Rfl: 1 .  lisinopril (PRINIVIL,ZESTRIL) 20 MG tablet, TK 1 T PO D, Disp: ,  Rfl:  .  methotrexate 50 MG/2ML injection, Take 0.8 ml once a week , 12 weeks, Disp: , Rfl:  .  omeprazole (PRILOSEC) 20 MG capsule, TAKE 1 CAPSULE(20 MG) BY MOUTH DAILY, Disp: 90 capsule, Rfl: 0 .  PROAIR HFA 108 (90 Base) MCG/ACT inhaler, INHALE 1 TO 2 PUFFS INTO THE LUNGS EVERY 6 HOURS AS NEEDED FOR WHEEZING OR SHORTNESS OF BREATH, Disp: 8.5 g, Rfl: 2 .  traZODone (DESYREL) 50 MG tablet, Take 1 tablet (50 mg total) by mouth at bedtime., Disp: 90 tablet, Rfl: 1 .  clotrimazole-betamethasone (LOTRISONE) cream, Apply 1 application topically 2 (two) times daily. (Patient not taking: Reported on 09/29/2018), Disp: 30 g, Rfl: 1 .  cromolyn (NASALCROM) 5.2 MG/ACT nasal spray, Place 1 spray into both nostrils as needed for allergies., Disp: , Rfl:  .  diclofenac (VOLTAREN) 75 MG EC tablet, TAKE 1 TABLET(75 MG) BY MOUTH TWICE DAILY AS NEEDED FOR MODERATE PAIN, Disp: 60 tablet, Rfl: 0 .  diltiazem (DILACOR XR) 180 MG 24 hr capsule, Take 1 capsule (180 mg total) by mouth daily., Disp: 90 capsule, Rfl: 1 .  diltiazem (DILACOR XR) 180 MG 24 hr capsule, Take by mouth., Disp: , Rfl:  .  doxycycline (VIBRA-TABS) 100 MG tablet, Take 1 tablet (100 mg total) by mouth 2 (two) times daily., Disp: 20 tablet, Rfl: 0 .  fluticasone (FLONASE) 50 MCG/ACT nasal spray, Place 2 sprays into both nostrils daily., Disp: 16 g, Rfl: 11  Allergies Penicillin g, Penicillins, Sulfa antibiotics, and Sulfasalazine  Family History  Problem Relation Age of Onset  . Breast cancer Sister   . Heart disease Brother   . Diabetes Brother     Social History Social History   Tobacco Use  . Smoking status: Former Smoker    Quit date: 07/19/2014    Years since quitting: 4.7  . Smokeless tobacco: Never Used  Substance Use Topics  . Alcohol use: Yes  . Drug use: No    Review of Systems Constitutional: Positive fever Eyes: No visual changes. ENT: Positive sore throat. Cardiovascular: Denies chest pain. Respiratory: Denies  shortness of breath. Gastrointestinal: No abdominal pain.  Positive recent nausea and vomiting.  No diarrhea.  No constipation. Genitourinary: Negative for dysuria. Musculoskeletal: Negative for back pain. Skin: Negative for rash. Neurological: Negative for focal weakness or numbness.   ____________________________________________   PHYSICAL EXAM:  VITAL SIGNS: ED Triage Vitals  Enc Vitals Group     BP 04/24/19 0859 121/71     Pulse Rate 04/24/19 0859 86     Resp 04/24/19 0859 18     Temp 04/24/19 0859 98.3 F (36.8 C)     Temp Source 04/24/19 0859 Oral     SpO2 04/24/19 0859 100 %     Weight 04/24/19 0853 114 lb (51.7 kg)     Height 04/24/19 0853 5\' 2"  (1.575 m)     Head Circumference --      Peak Flow --      Pain Score 04/24/19 0853 6  Pain Loc --      Pain Edu? --      Excl. in [place]? --     Constitutional: Alert and oriented. Well appearing and in no acute distress. Eyes: Conjunctivae are normal. PERRL.  ENT      Head: Normocephalic and atraumatic.      Nose: No congestion      Mouth/Throat: Mucous membranes are moist.Oropharynx non-erythematous.  No tonsillar swelling or exudate. Neck: No stridor. Supple without meningismus.  Hematological/Lymphatic/Immunilogical: No cervical lymphadenopathy. Cardiovascular: Normal rate, regular rhythm. Grossly normal heart sounds.  Good peripheral circulation. Respiratory: Normal respiratory effort without tachypnea nor retractions. Breath sounds are clear and equal bilaterally. No wheezes, rales, rhonchi. Musculoskeletal: Steady gait.  No lower extremity edema noted bilaterally. Neurologic:  Normal speech and language. No gross focal neurologic deficits are appreciated. Speech is normal. No gait instability.  Skin:  Skin is warm, dry and intact. No rash noted. Psychiatric: Mood and affect are normal. Speech and behavior are normal. Patient exhibits appropriate insight and judgment   ___________________________________________    LABS (all labs ordered are listed, but only abnormal results are displayed)  Labs Reviewed  RAPID STREP SCREEN ([facility] ONLY)  CULTURE, GROUP A STREP [facility])   ____________________________________________  EKG  ED ECG REPORT I, Marylene Land, the attending provider, personally viewed and interpreted this ECG.   Date: 04/24/2019  EKG Time: 0942  Rate: 79  Rhythm: normal sinus rhythm  Axis: normal  Intervals:none  ST&T Change: none  RADIOLOGY  Dg Chest 2 View  Result Date: 04/24/2019 CLINICAL DATA:  Fever and cough for 4 days. Immunocompromise. Shortness of breath. EXAM: CHEST - 2 VIEW COMPARISON:  01/31/2018 FINDINGS: The cardiac silhouette, mediastinal and hilar contours are normal and stable. There is tortuosity and calcification of the thoracic aorta. Stable emphysematous changes with hyperinflation and attenuation of pulmonary vasculature. No infiltrates, edema or effusions. No worrisome pulmonary lesions. The bony thorax is intact. IMPRESSION: Emphysematous changes but no acute overlying pulmonary findings. Electronically Signed   By: Marijo Sanes M.D.   On: 04/24/2019 09:38   ____________________________________________   PROCEDURES Procedures     INITIAL IMPRESSION / ASSESSMENT AND PLAN / ED COURSE  Pertinent labs & imaging results that were available during my care of the patient were reviewed by me and considered in my medical decision making (see chart for details).  well-appearing patient.  No acute distress.  Recent sore throat, fever and body aches.  Strep negative, will culture.  Chest x-ray no acute changes as above per radiologist.  Suspect viral illness.  Recommend COVID-19 testing, testing ordered.  Recommend rest, fluids, supportive care and very close monitoring encourage close follow-up with primary care.  Discussed very strict reevaluation and return parameters.  Discussed follow up with Primary care physician this week. Discussed follow up  and return parameters including no resolution or any worsening concerns. Patient verbalized understanding and agreed to plan.   ____________________________________________   FINAL CLINICAL IMPRESSION(S) / ED DIAGNOSES  Final diagnoses:  Viral illness  Advice Given About Covid-19 Virus Infection     ED Discharge Orders         Ordered    Novel Coronavirus, NAA ([facility])  [place] testing site only     04/24/19 0956           Note: This dictation was prepared with Dragon dictation along with smaller phrase technology. Any transcriptional errors that result from this process are unintentional.  Marylene Land, NP 04/24/19 1022

## 2019-04-26 LAB — NOVEL CORONAVIRUS, NAA: SARS-CoV-2, NAA: NOT DETECTED

## 2019-04-27 ENCOUNTER — Telehealth (HOSPITAL_COMMUNITY): Payer: Self-pay | Admitting: Emergency Medicine

## 2019-04-27 LAB — CULTURE, GROUP A STREP (THRC)

## 2019-04-27 NOTE — Telephone Encounter (Signed)
Your test for COVID-19 was negative.  Please continue good preventive care measures, including:  frequent hand-washing, avoid touching your face, cover coughs/sneezes, stay out of crowds and keep a 6 foot distance from others.  If you develop fever/cough/breathlessness, please stay home for 10 days and until you have had 3 consecutive days with cough/breathlessness improving and without fever (without taking a fever reducer). Go to the nearest hospital ED tent for assessment if fever/cough/breathlessness are severe or illness seems like a threat to life.  Patient contacted and made aware of all results, all questions answered. Will follow up with PCP this week

## 2019-04-28 ENCOUNTER — Other Ambulatory Visit: Payer: Self-pay | Admitting: Nurse Practitioner

## 2019-04-28 DIAGNOSIS — E039 Hypothyroidism, unspecified: Secondary | ICD-10-CM

## 2019-04-28 DIAGNOSIS — I10 Essential (primary) hypertension: Secondary | ICD-10-CM

## 2019-04-30 ENCOUNTER — Other Ambulatory Visit: Payer: Self-pay | Admitting: Nurse Practitioner

## 2019-04-30 DIAGNOSIS — M792 Neuralgia and neuritis, unspecified: Secondary | ICD-10-CM

## 2019-04-30 MED ORDER — GABAPENTIN 100 MG PO CAPS: ORAL_CAPSULE | ORAL | 0 refills | Status: DC

## 2019-05-11 ENCOUNTER — Other Ambulatory Visit: Payer: Self-pay

## 2019-05-11 ENCOUNTER — Ambulatory Visit (INDEPENDENT_AMBULATORY_CARE_PROVIDER_SITE_OTHER): Payer: Medicare Other | Admitting: Family Medicine

## 2019-05-11 ENCOUNTER — Other Ambulatory Visit: Payer: Self-pay | Admitting: Nurse Practitioner

## 2019-05-11 ENCOUNTER — Encounter: Payer: Self-pay | Admitting: Family Medicine

## 2019-05-11 DIAGNOSIS — S40862A Insect bite (nonvenomous) of left upper arm, initial encounter: Secondary | ICD-10-CM | POA: Diagnosis not present

## 2019-05-11 DIAGNOSIS — W57XXXA Bitten or stung by nonvenomous insect and other nonvenomous arthropods, initial encounter: Secondary | ICD-10-CM

## 2019-05-11 DIAGNOSIS — G4709 Other insomnia: Secondary | ICD-10-CM

## 2019-05-11 DIAGNOSIS — A938 Other specified arthropod-borne viral fevers: Secondary | ICD-10-CM | POA: Diagnosis not present

## 2019-05-11 MED ORDER — AMOXICILLIN 500 MG PO CAPS: 500.0000 mg | ORAL_CAPSULE | Freq: Three times a day (TID) | ORAL | 0 refills | Status: DC

## 2019-05-11 MED ORDER — DOXYCYCLINE HYCLATE 100 MG PO TABS: 100.0000 mg | ORAL_TABLET | Freq: Two times a day (BID) | ORAL | 0 refills | Status: DC

## 2019-05-11 NOTE — Patient Instructions (Addendum)
Take Amoxicillin 500mg  3 times a day for possible tick borne illness coverage - take this for 2 weeks  Contact us if develop rash or other side effect, may have to switch to doxycycline  Otherwise can be rheumatological possibility with last lab showing CRP >21 which means inflammation or infection within body  Follow-up as scheduled  Please schedule a Follow-up Appointment to: Return in about 4 weeks (around 06/08/2019), or if symptoms worsen or fail to improve, for as scheduled.  If you have any other questions or concerns, please feel free to call the office or send a message through [place]. You may also schedule an earlier appointment if necessary.  Additionally, you may be receiving a survey about your experience at our office within a few days to 1 week by e-mail or mail. We value your feedback.  Nobie Putnam, DO [place]

## 2019-05-11 NOTE — Progress Notes (Addendum)
Virtual Visit via Telephone The purpose of this virtual visit is to provide medical care while limiting exposure to the novel coronavirus (COVID19) for both patient and office staff.  Consent was obtained for phone visit:  Yes.   Answered questions that patient had about telehealth interaction:  Yes.   I discussed the limitations, risks, security and privacy concerns of performing an evaluation and management service by telephone. I also discussed with the patient that there may be a patient responsible charge related to this service. The patient expressed understanding and agreed to proceed.  Patient Location: Home Provider Location: [facility] (Office)  PCP is Cassell Smiles, AGPCNP-BC - I am currently covering during her maternity leave.  ---------------------------------------------------------------------- Chief Complaint  Patient presents with  . Fever    viral illness still has same Sxs, abdominal discomfort, exhaustion, nausea, low grade fever mostly at night, pain between shoulders, patient had covid test done 2 weeks ago which was negative  . Tick Bite    S: Reviewed CMA documentation. I have called patient and gathered additional HPI as follows:  Urgent Care FOLLOW-UP VISIT   Hospital/Location: [facility] Date of UC Visit: 04/24/19  Reason for Presenting to UC - fever, sore throat, nausea headache Primary (+Secondary) Diagnosis: Viral Syndrome / Possible COVID  FOLLOW-UP  - ED provider note and record have been reviewed - Patient presents today about  days 17 after recent ED visit. Brief summary of recent course, patient had symptoms of viral syndrome with fever chills headache aches, fatigue malaise, shoulder blade pain for few days prior to presenting to urgent care, she did have a tick bite on her left forearm for few days to week prior to that, testing in ED with rapid strep negative, chest x-ray with some emphysema without other acute  finding, had COVID19 test that was NEGATIVE  - Today reports overall has had similar symptoms with only mild improvement since that time, she gave it time to hopefully resolve on it's own, she thought likely viral as well. And it was not improving. -Admits Generalized fatigue, malaise, nausea, episodic fever, sweats, weakness - Improved fever and chills now - But still persistent symptoms - Next apt with PCP Cassell Smiles, AGPCNP-BC on 06/12/19 - She has Rheumatoid Arthritis, on methotrexate, she is followed by Dr Annalee Genta, next apt is in August 2020  Denies any high risk travel to areas of current concern for COVID19. Denies any known or suspected exposure to person with or possibly with COVID19.   Past Medical History:  Diagnosis Date  . Allergy   . Arthritis   . Bunion   . Chronic sinusitis   . High risk medication use   . Hyperlipidemia   . Hypertension   . Hypothyroidism   . Insomnia   . Macular degeneration   . Osteoporosis   . Paroxysmal A-fib ([place])   . Prediabetes   . Rheumatoid arthritis ([place])    Social History   Tobacco Use  . Smoking status: Former Smoker    Quit date: 07/19/2014    Years since quitting: 4.8  . Smokeless tobacco: Never Used  Substance Use Topics  . Alcohol use: Yes  . Drug use: No    Current Outpatient Medications:  .  alendronate (FOSAMAX) 70 MG tablet, TAKE 1 TABLET BY MOUTH ONCE A WEEK, WITH A FULL GLASS OF WATER ON AN EMPTY STOMACH, REMAIN UPRIGHT FOR 30 MINUTES, Disp: 12 tablet, Rfl: 0 .  diltiazem (CARDIZEM CD) 120  MG 24 hr capsule, Take 1 capsule (120 mg total) by mouth daily., Disp: 90 capsule, Rfl: 1 .  diltiazem (DILACOR XR) 180 MG 24 hr capsule, Take 1 capsule (180 mg total) by mouth daily., Disp: 90 capsule, Rfl: 1 .  fluticasone (FLONASE) 50 MCG/ACT nasal spray, Place 2 sprays into both nostrils daily., Disp: 16 g, Rfl: 11 .  folic acid (FOLVITE) 1 MG tablet, Take 1 mg by mouth daily., Disp: , Rfl:  .  gabapentin (NEURONTIN)  100 MG capsule, TAKE 1 CAPSULE BY MOUTH TWICE DAILY AS NEEDED THEN TAKE 3 CAPSULES BY MOUTH AT BEDTIME, Disp: 450 capsule, Rfl: 0 .  hydroxychloroquine (PLAQUENIL) 200 MG tablet, Take 300 mg daily (1 tab and a half ) daily, 90 days, Disp: , Rfl:  .  levothyroxine (SYNTHROID) 25 MCG tablet, TAKE 1 TABLET(25 MCG) BY MOUTH DAILY BEFORE BREAKFAST, Disp: 90 tablet, Rfl: 1 .  lisinopril (ZESTRIL) 20 MG tablet, TAKE 1 TABLET(20 MG) BY MOUTH DAILY, Disp: 90 tablet, Rfl: 1 .  methotrexate 50 MG/2ML injection, Take 0.8 ml once a week , 12 weeks, Disp: , Rfl:  .  omeprazole (PRILOSEC) 20 MG capsule, TAKE 1 CAPSULE(20 MG) BY MOUTH DAILY, Disp: 90 capsule, Rfl: 0 .  PROAIR HFA 108 (90 Base) MCG/ACT inhaler, INHALE 1 TO 2 PUFFS INTO THE LUNGS EVERY 6 HOURS AS NEEDED FOR WHEEZING OR SHORTNESS OF BREATH, Disp: 8.5 g, Rfl: 2 .  traZODone (DESYREL) 50 MG tablet, Take 1 tablet (50 mg total) by mouth at bedtime., Disp: 90 tablet, Rfl: 1 .  clotrimazole-betamethasone (LOTRISONE) cream, Apply 1 application topically 2 (two) times daily. (Patient not taking: Reported on 09/29/2018), Disp: 30 g, Rfl: 1 .  cromolyn (NASALCROM) 5.2 MG/ACT nasal spray, Place 1 spray into both nostrils as needed for allergies., Disp: , Rfl:  .  diclofenac (VOLTAREN) 75 MG EC tablet, TAKE 1 TABLET(75 MG) BY MOUTH TWICE DAILY AS NEEDED FOR MODERATE PAIN (Patient not taking: Reported on 05/11/2019), Disp: 60 tablet, Rfl: 0 .  diltiazem (DILACOR XR) 180 MG 24 hr capsule, Take by mouth., Disp: , Rfl:  .  doxycycline (VIBRA-TABS) 100 MG tablet, Take 1 tablet (100 mg total) by mouth 2 (two) times daily. For 10 days. Take with full glass of water, stay upright 30 min after taking., Disp: 20 tablet, Rfl: 0  Depression screen [facility] 2/9 05/11/2019 09/29/2018 07/19/2017  Decreased Interest 0 0 0  Down, Depressed, Hopeless 0 0 0  PHQ - 2 Score 0 0 0  Altered sleeping - - 0  Tired, decreased energy - - 0  Change in appetite - - 0  Feeling bad or failure about  yourself  - - 0  Trouble concentrating - - 0  Moving slowly or fidgety/restless - - 0  Suicidal thoughts - - 0  PHQ-9 Score - - 0    No flowsheet data found.  -------------------------------------------------------------------------- O: No physical exam performed due to remote telephone encounter.  Lab results reviewed.  I have personally reviewed the radiology report from 04/24/19  DG Chest 2 ViewPerformed 04/24/2019 Final result  Study Result CLINICAL DATA: Fever and cough for 4 days. Immunocompromise. Shortness of breath.  EXAM: CHEST - 2 VIEW  COMPARISON: 01/31/2018  FINDINGS: The cardiac silhouette, mediastinal and hilar contours are normal and stable. There is tortuosity and calcification of the thoracic aorta.  Stable emphysematous changes with hyperinflation and attenuation of pulmonary vasculature. No infiltrates, edema or effusions. No worrisome pulmonary lesions. The bony thorax is intact.  IMPRESSION: Emphysematous changes but no acute overlying pulmonary findings.   Electronically Signed By: Marijo Sanes M.D. On: 04/24/2019 09:38    Recent Results (from the past 2160 hour(s))  Rapid Strep Screen ([facility] ONLY)     Status: None   Collection Time: 04/24/19  9:01 AM   Specimen: Oral Mucosa/Gingiva; Other  Result Value Ref Range   Streptococcus, Group A Screen (Direct) NEGATIVE NEGATIVE    Comment: (NOTE) A Rapid Antigen test may result negative if the antigen level in the sample is below the detection level of this test. The FDA has not cleared this test as a stand-alone test therefore the rapid antigen negative result has reflexed to a Group A Strep culture. Performed at [facility], [address] [place], [place] [address]   Culture, group A strep     Status: None   Collection Time: 04/24/19  9:01 AM   Specimen: Throat  Result Value Ref Range   Specimen Description      THROAT Performed at [facility]  [facility], [address], [place], [place] [address]    Special Requests      NONE Reflexed from [phone] [id number] Performed at [facility], [address] [place], [place] [address]    Culture      NO GROUP A STREP (S.PYOGENES) ISOLATED Performed at [facility], [place] [address] [place], [place] [address]    Report Status 04/27/2019 FINAL   Novel Coronavirus, NAA ([facility])  Drive up testing site only     Status: None   Collection Time: 04/24/19 11:00 AM  Result Value Ref Range   SARS-CoV-2, NAA Not Detected Not Detected    Comment: This test was developed and its performance characteristics determined by [employer]. This test has not been FDA cleared or approved. This test has been authorized by FDA under an Emergency Use Authorization (EUA). This test is only authorized for the duration of time the declaration that circumstances exist justifying the authorization of the emergency use of in vitro diagnostic tests for detection of SARS-CoV-2 virus and/or diagnosis of COVID-19 infection under section 564(b)(1) of the Act, 21 U.S.C. 202RKY-7(C)(6), unless the authorization is terminated or revoked sooner. When diagnostic testing is negative, the possibility of a false negative result should be considered in the context of a patient's recent exposures and the presence of clinical signs and symptoms consistent with COVID-19. An individual without symptoms of COVID-19 and who is not shedding SARS-CoV-2 virus would expect to have a negative (not detected) result in this assay.     -------------------------------------------------------------------------- A&P:  Problem List Items Addressed This Visit    None    Visit Diagnoses    Tick-borne fever    -  Primary   Relevant Medications   doxycycline (VIBRA-TABS) 100 MG tablet   Tick bite of left upper arm, initial encounter       Relevant Medications   doxycycline (VIBRA-TABS) 100 MG tablet     Concern for possible  other cause for her symptoms of fever and malaise and systemic symptoms not improving or resolving now >4 weeks, seems initially thought to be viral syndrome vs COVID19 but all testing was negative. Also considered tick borne illness for fever seems to be recurrent issue and other constellation of symptoms - Not entirely characteristic of lyme, but cannot rule out - would consider additional labs for antibody testing but at this time, will opt to treat first after discussion with patient  Plan - Offered doxycycline for possible tickborne illness, she had side effect GI intolerance in past, request to switch, she has actually taken Amoxicillin before recently without problem, she has history of PCN allergy, she agrees to take it and monitor symptoms, rx 500mg  TID for 2 weeks for lyme coverage empirically, may treat other underlying infection if present  Additionally thought could be related to Rheumatoid Arthritis given her last labs from [place] [facility] recently in June 2020 showed CRP >21  She can discuss further with rheumatology in August at next apt if still not improved.  UPDATE - received notification from pharmacy that interaction with Amoxicillin and MTX, called and spoke with [facility] pharmacist and then to patient, we all agree to switch med to Doxycycline instead, sent 10 day supply doxycycline, BID, and DC'd amoxicillin. Patient was updated and notified she agrees with plan will take Doxy.   Meds ordered this encounter  Medications  . DISCONTD: amoxicillin (AMOXIL) 500 MG capsule    Sig: Take 1 capsule (500 mg total) by mouth 3 (three) times daily. For 2 weeks    Dispense:  42 capsule    Refill:  0  . doxycycline (VIBRA-TABS) 100 MG tablet    Sig: Take 1 tablet (100 mg total) by mouth 2 (two) times daily. For 10 days. Take with full glass of water, stay upright 30 min after taking.    Dispense:  20 tablet    Refill:  0    Discontinue Amoxicillin due to interaction, patient already  notified by physician, will switch to Doxycycline.    Follow-up: - Return in 4 weeks as planned if not improved  Patient verbalizes understanding with the above medical recommendations including the limitation of remote medical advice.  Specific follow-up and call-back criteria were given for patient to follow-up or seek medical care more urgently if needed.   - Time spent in direct consultation with patient on phone: 15 minutes   Nobie Putnam, [facility] 05/11/2019, 1:30 PM

## 2019-05-11 NOTE — Addendum Note (Signed)
Addended by: Olin Hauser on: 05/11/2019 04:47 PM   Modules accepted: Orders

## 2019-06-12 ENCOUNTER — Encounter: Payer: Self-pay | Admitting: Nurse Practitioner

## 2019-06-12 ENCOUNTER — Ambulatory Visit (INDEPENDENT_AMBULATORY_CARE_PROVIDER_SITE_OTHER): Payer: Medicare Other | Admitting: Nurse Practitioner

## 2019-06-12 ENCOUNTER — Other Ambulatory Visit: Payer: Self-pay

## 2019-06-12 VITALS — BP 154/80 | HR 81 | Ht 62.0 in | Wt 117.6 lb

## 2019-06-12 DIAGNOSIS — Z23 Encounter for immunization: Secondary | ICD-10-CM | POA: Diagnosis not present

## 2019-06-12 DIAGNOSIS — I480 Paroxysmal atrial fibrillation: Secondary | ICD-10-CM | POA: Diagnosis not present

## 2019-06-12 DIAGNOSIS — E785 Hyperlipidemia, unspecified: Secondary | ICD-10-CM

## 2019-06-12 DIAGNOSIS — R7303 Prediabetes: Secondary | ICD-10-CM | POA: Diagnosis not present

## 2019-06-12 DIAGNOSIS — K219 Gastro-esophageal reflux disease without esophagitis: Secondary | ICD-10-CM

## 2019-06-12 DIAGNOSIS — M810 Age-related osteoporosis without current pathological fracture: Secondary | ICD-10-CM

## 2019-06-12 DIAGNOSIS — M792 Neuralgia and neuritis, unspecified: Secondary | ICD-10-CM

## 2019-06-12 DIAGNOSIS — S81812A Laceration without foreign body, left lower leg, initial encounter: Secondary | ICD-10-CM | POA: Diagnosis not present

## 2019-06-12 DIAGNOSIS — E039 Hypothyroidism, unspecified: Secondary | ICD-10-CM

## 2019-06-12 DIAGNOSIS — I10 Essential (primary) hypertension: Secondary | ICD-10-CM | POA: Diagnosis not present

## 2019-06-12 MED ORDER — DILTIAZEM HCL ER 180 MG PO CP24: 180.0000 mg | ORAL_CAPSULE | Freq: Every day | ORAL | 1 refills | Status: DC

## 2019-06-12 MED ORDER — OMEPRAZOLE 20 MG PO CPDR: DELAYED_RELEASE_CAPSULE | ORAL | 0 refills | Status: DC

## 2019-06-12 MED ORDER — LISINOPRIL 20 MG PO TABS: ORAL_TABLET | ORAL | 1 refills | Status: DC

## 2019-06-12 MED ORDER — ALENDRONATE SODIUM 70 MG PO TABS: ORAL_TABLET | ORAL | 0 refills | Status: DC

## 2019-06-12 MED ORDER — GABAPENTIN 100 MG PO CAPS: ORAL_CAPSULE | ORAL | 0 refills | Status: DC

## 2019-06-12 MED ORDER — LEVOTHYROXINE SODIUM 25 MCG PO TABS: ORAL_TABLET | ORAL | 1 refills | Status: DC

## 2019-06-12 NOTE — Patient Instructions (Addendum)
Alexis Owens,   Thank you for coming in to clinic today.  1. Return to Diltiazem 180 ER once daily  2. You are getting your tetanus shot today.  Please schedule a follow-up appointment with Cassell Smiles, AGNP. Return in about 6 months (around 12/13/2019) for hypertension, pre-diabetes, cholesterol.  If you have any other questions or concerns, please feel free to call the clinic or send a message through [place]. You may also schedule an earlier appointment if necessary.  You will receive a survey after today's visit either digitally by e-mail or paper by [employer]. Your experiences and feedback matter to [place].  Please respond so we know how we are doing as we provide care for you.   Cassell Smiles, DNP, AGNP-BC Adult Gerontology Nurse Practitioner [place]

## 2019-06-12 NOTE — Progress Notes (Signed)
Subjective:    Patient ID: Alexis Owens, female    DOB: 1941/03/11, 78 y.o.   MRN: [id number]  Alexis Owens is a 78 y.o. female presenting on 06/12/2019 for Hypertension and Tick Removal (left wrist and lower abdominal x 2 mths ago. The pt concern she develop symptoms from tickbites.)   HPI Hypertension - She is not checking BP at home or outside of clinic.    - Current medications: lisinopril 20 mg daily, diltiazem 120 mg once daily, tolerating well without side effects - She is not currently symptomatic. - Pt denies headache, lightheadedness, dizziness, changes in vision, chest tightness/pressure, palpitations, leg swelling, sudden loss of speech or loss of consciousness. - She  reports an exercise routine that includes gardening, walking, 5-6 days per week. - Her diet is moderate in salt, moderate in fat, and moderate in carbohydrates.   History of tick bites 2 months has felt.  She had one on left wrist.  Now still itches.  No rash, one on belly. After tick bite, started getting sick.  No other reasons for sickness.  Covid testing is negative in past.  Also evaluated virtually by Dr. Parks Ranger.  He had 14 days doxycycline, but felt better and has no ongoing concerns.  Lacerations LEFT lower leg She has cut herself with her rake when doing gardening.  It is healing well, but she requests her tetanus shot today.  Osteoporosis: no falls or fractures.  Tolerating alendronate without any missed doses  GERD: without current symptoms.  Tolerates PPI well.  No bleeding noted.  Social History   Tobacco Use  . Smoking status: Former Smoker    Quit date: 07/19/2014    Years since quitting: 4.9  . Smokeless tobacco: Never Used  Substance Use Topics  . Alcohol use: Yes  . Drug use: No    Review of Systems Per HPI unless specifically indicated above     Objective:    BP (!) 154/80 (BP Location: Left Arm, Patient Position: Sitting, Cuff Size: Small)   Pulse 81   Ht 5\' 2"   (1.575 m)   Wt 117 lb 9.6 oz (53.3 kg)   BMI 21.51 kg/m   Wt Readings from Last 3 Encounters:  06/12/19 117 lb 9.6 oz (53.3 kg)  04/24/19 114 lb (51.7 kg)  09/29/18 116 lb 9.6 oz (52.9 kg)    Physical Exam Vitals signs reviewed.  Constitutional:      General: She is not in acute distress.    Appearance: She is well-developed.  HENT:     Head: Normocephalic and atraumatic.  Cardiovascular:     Rate and Rhythm: Normal rate and regular rhythm.     Pulses:          Radial pulses are 2+ on the right side and 2+ on the left side.       Posterior tibial pulses are 1+ on the right side and 1+ on the left side.     Heart sounds: Normal heart sounds, S1 normal and S2 normal.  Pulmonary:     Effort: Pulmonary effort is normal. No respiratory distress.     Breath sounds: Normal breath sounds and air entry.  Musculoskeletal:     Right lower leg: No edema.     Left lower leg: No edema.     Comments: No spinal bony tenderness.  Skin:    General: Skin is warm and dry.     Capillary Refill: Capillary refill takes less than 2 seconds.  Neurological:     General: No focal deficit present.     Mental Status: She is alert and oriented to person, place, and time. Mental status is at baseline.  Psychiatric:        Attention and Perception: Attention normal.        Mood and Affect: Mood and affect normal.        Behavior: Behavior normal. Behavior is cooperative.     Results for orders placed or performed during the hospital encounter of 04/24/19  Rapid Strep Screen ([facility] ONLY)   Specimen: Oral Mucosa/Gingiva; Other  Result Value Ref Range   Streptococcus, Group A Screen (Direct) NEGATIVE NEGATIVE  Culture, group A strep   Specimen: Throat  Result Value Ref Range   Specimen Description      THROAT Performed at [facility], [address] [place], [place] [address]    Special Requests      NONE Reflexed from [phone] [id number] Performed at [facility], [address] [place], [place] [address]    Culture      NO GROUP A STREP (S.PYOGENES) ISOLATED Performed at [facility], [place] [address], [place], [place] [address]    Report Status 04/27/2019 FINAL   Novel Coronavirus, NAA ([facility])  Drive up testing site only  Result Value Ref Range   SARS-CoV-2, NAA Not Detected Not Detected      Assessment & Plan:   Problem List Items Addressed This Visit      Cardiovascular and Mediastinum   Hypertension - Primary Uncontrolled today with BP above goal.   - Increase diltiazem to 180 mg daily - Continue lisinopril - Due for labs today - Continue healthy lifestyle - Follow-up 6 months    Relevant Medications   diltiazem (DILACOR XR) 180 MG 24 hr capsule   lisinopril (ZESTRIL) 20 MG tablet   Paroxysmal A-fib (HCC) Stable rate today.  Tolerates diltiazem.  Remains in afib.  Continue cardiology follow-up.  Follow-up here prn and in 1 year.   Relevant Medications   diltiazem (DILACOR XR) 180 MG 24 hr capsule   lisinopril (ZESTRIL) 20 MG tablet     Endocrine   Hypothyroidism Status unknown.  Recheck labs.  Continue meds without changes today.  Refills provided. Followup prn after labs and in 6 months.    Relevant Medications   levothyroxine (SYNTHROID) 25 MCG tablet   Other Relevant Orders   TSH     Other   Prediabetes Status unknown.  Recheck labs.  Continue meds without changes today.  Refills provided. Followup prn for treatment changes after labs.    Relevant Orders   BASIC METABOLIC PANEL WITH GFR   Hemoglobin A1c   Hyperlipidemia Previously stable.  Continue lifestyle modification. Recheck labs today.  Follow-up 6 months.   Relevant Medications   diltiazem (DILACOR XR) 180 MG 24 hr capsule   lisinopril (ZESTRIL) 20 MG tablet   Other Relevant Orders   BASIC METABOLIC PANEL WITH GFR   Lipid panel   Neuropathic pain Stable. Needs refills and are provided.  Follow-up 6 months.   Relevant Medications   gabapentin  (NEURONTIN) 100 MG capsule    Other Visit Diagnoses    Laceration of left lower extremity, initial encounter     Lacerations of left lower leg on shin are healing well.  Needs Tdap.   Relevant Orders   Tdap vaccine greater than or equal to 7yo IM (Completed)   Age-related osteoporosis without current  pathological fracture     Continue alendronate.  Repeat DEXA in about 6 months.  Follow-up 6 months.   Relevant Medications   alendronate (FOSAMAX) 70 MG tablet   Gastroesophageal reflux disease without esophagitis     Stable today on exam.  Medications tolerated without side effects.  Continue at current doses.  Refills provided.  Check labs today. Followup 6 months.    Relevant Medications   omeprazole (PRILOSEC) 20 MG capsule      Meds ordered this encounter  Medications  . diltiazem (DILACOR XR) 180 MG 24 hr capsule    Sig: Take 1 capsule (180 mg total) by mouth daily.    Dispense:  90 capsule    Refill:  1    Order Specific Question:   Supervising Provider    Answer:   Olin Hauser [[id number]]  . alendronate (FOSAMAX) 70 MG tablet    Sig: TAKE 1 TABLET BY MOUTH ONCE A WEEK, WITH A FULL GLASS OF WATER ON AN EMPTY STOMACH, REMAIN UPRIGHT FOR 30 MINUTES    Dispense:  12 tablet    Refill:  0    Order Specific Question:   Supervising Provider    Answer:   Olin Hauser [[id number]]  . gabapentin (NEURONTIN) 100 MG capsule    Sig: TAKE 1 CAPSULE BY MOUTH TWICE DAILY AS NEEDED THEN TAKE 3 CAPSULES BY MOUTH AT BEDTIME    Dispense:  450 capsule    Refill:  0    Order Specific Question:   Supervising Provider    Answer:   Olin Hauser [[id number]]  . levothyroxine (SYNTHROID) 25 MCG tablet    Sig: TAKE 1 TABLET(25 MCG) BY MOUTH DAILY BEFORE BREAKFAST    Dispense:  90 tablet    Refill:  1    Order Specific Question:   Supervising Provider    Answer:   Olin Hauser [[id number]]  . lisinopril (ZESTRIL) 20 MG tablet    Sig: TAKE 1 TABLET(20 MG) BY MOUTH DAILY     Dispense:  90 tablet    Refill:  1    Order Specific Question:   Supervising Provider    Answer:   Olin Hauser [[id number]]  . omeprazole (PRILOSEC) 20 MG capsule    Sig: TAKE 1 CAPSULE(20 MG) BY MOUTH DAILY    Dispense:  90 capsule    Refill:  0    Order Specific Question:   Supervising Provider    Answer:   Olin Hauser [[id number]]    Follow up plan: Return in about 6 months (around 12/13/2019) for hypertension, pre-diabetes, cholesterol.  Cassell Smiles, DNP, AGPCNP-BC Adult Gerontology Primary Care Nurse Practitioner [facility] 06/12/2019, 10:09 AM

## 2019-06-13 LAB — BASIC METABOLIC PANEL WITHOUT GFR
BUN: 24 mg/dL (ref 7–25)
CO2: 20 mmol/L (ref 20–32)
Calcium: 9.7 mg/dL (ref 8.6–10.4)
Chloride: 107 mmol/L (ref 98–110)
Creat: 0.85 mg/dL (ref 0.60–0.93)
GFR, Est African American: 77 mL/min/1.73m2
GFR, Est Non African American: 66 mL/min/1.73m2
Glucose, Bld: 99 mg/dL (ref 65–99)
Potassium: 4.2 mmol/L (ref 3.5–5.3)
Sodium: 139 mmol/L (ref 135–146)

## 2019-06-13 LAB — LIPID PANEL
Cholesterol: 178 mg/dL
HDL: 71 mg/dL
LDL Cholesterol (Calc): 89 mg/dL
Non-HDL Cholesterol (Calc): 107 mg/dL
Total CHOL/HDL Ratio: 2.5 (calc)
Triglycerides: 89 mg/dL

## 2019-06-13 LAB — HEMOGLOBIN A1C
Hgb A1c MFr Bld: 5.2 %{Hb}
Mean Plasma Glucose: 103 (calc)
eAG (mmol/L): 5.7 (calc)

## 2019-07-03 ENCOUNTER — Other Ambulatory Visit: Payer: Self-pay | Admitting: Nurse Practitioner

## 2019-07-03 DIAGNOSIS — K219 Gastro-esophageal reflux disease without esophagitis: Secondary | ICD-10-CM

## 2019-10-09 DIAGNOSIS — R059 Cough, unspecified: Secondary | ICD-10-CM

## 2019-10-09 DIAGNOSIS — R05 Cough: Secondary | ICD-10-CM

## 2019-10-12 MED ORDER — ALBUTEROL SULFATE HFA 108 (90 BASE) MCG/ACT IN AERS: INHALATION_SPRAY | RESPIRATORY_TRACT | 2 refills | Status: DC

## 2019-11-10 ENCOUNTER — Other Ambulatory Visit: Payer: Self-pay | Admitting: Family Medicine

## 2019-11-10 DIAGNOSIS — G4709 Other insomnia: Secondary | ICD-10-CM

## 2019-11-30 ENCOUNTER — Telehealth: Payer: Self-pay | Admitting: Family Medicine

## 2019-11-30 NOTE — Telephone Encounter (Signed)
I left a message asking the patient to call and schedule AWV-I with Tiffany. VDM (DD) °

## 2019-12-15 ENCOUNTER — Ambulatory Visit: Payer: Medicare Other | Admitting: Nurse Practitioner

## 2019-12-18 ENCOUNTER — Other Ambulatory Visit: Payer: Self-pay

## 2019-12-18 ENCOUNTER — Encounter: Payer: Self-pay | Admitting: Family Medicine

## 2019-12-18 ENCOUNTER — Ambulatory Visit (INDEPENDENT_AMBULATORY_CARE_PROVIDER_SITE_OTHER): Payer: Medicare Other | Admitting: Family Medicine

## 2019-12-18 VITALS — BP 178/90 | HR 77 | Temp 97.8°F | Resp 16 | Ht 62.0 in | Wt 126.0 lb

## 2019-12-18 DIAGNOSIS — I10 Essential (primary) hypertension: Secondary | ICD-10-CM

## 2019-12-18 DIAGNOSIS — M0579 Rheumatoid arthritis with rheumatoid factor of multiple sites without organ or systems involvement: Secondary | ICD-10-CM

## 2019-12-18 DIAGNOSIS — R7303 Prediabetes: Secondary | ICD-10-CM

## 2019-12-18 DIAGNOSIS — Z23 Encounter for immunization: Secondary | ICD-10-CM

## 2019-12-18 DIAGNOSIS — E782 Mixed hyperlipidemia: Secondary | ICD-10-CM

## 2019-12-18 DIAGNOSIS — J432 Centrilobular emphysema: Secondary | ICD-10-CM

## 2019-12-18 DIAGNOSIS — Z79899 Other long term (current) drug therapy: Secondary | ICD-10-CM

## 2019-12-18 DIAGNOSIS — E039 Hypothyroidism, unspecified: Secondary | ICD-10-CM

## 2019-12-18 DIAGNOSIS — I480 Paroxysmal atrial fibrillation: Secondary | ICD-10-CM | POA: Diagnosis not present

## 2019-12-18 NOTE — Progress Notes (Signed)
Subjective:    Patient ID: Alexis Owens, female    DOB: 1941/03/09, 79 y.o.   MRN: [id number]  Alexis Owens is a 79 y.o. female presenting on 12/18/2019 for Hypertension   HPI   Rheumatoid Arthritis Followed by [facility], takes Methotrexate, Plaquenil has apt coming up. Request lab updates Bilateral Fingers/Toes Numbness Tingling Reports concern for possible circulation issue. Also admits swelling related to RA  Hypothyroidism Chronic problem.  On levothyroxine 56mcg daily Due for labs  CHRONIC HTN: Reports she has had stress with COVID and increased her BP. Not checking regularly. Current Meds - Lisinopril 20mg  daily, Diltiazem 180 mg 24 hr daily   Reports good compliance, took meds today. Tolerating well, w/o complaints. Denies CP, dyspnea, HA, edema, dizziness / lightheadedness  Paroxysmal Atrial fibrillation Controlled rate on Diltiazem. No episodic flare up.  Centrilobular Emphysema COPD / Former smoker Admits inc mucus sputum production chronic issue, mostly clear however, no flare up or wheezing dyspnea. Has Albuterol PRN. Rarely using.  Pre-Diabetes Reports inc thirst and fam history DM Prior A1c improved to 5.2 Meds: none Currently on ACEi  Denies hypoglycemia, polyuria, visual changes, numbness or tingling.   Health Maintenance: Due for Flu Shot, will receive today ,high dose Info given on COVID19 vaccine   Depression screen [facility] 2/9 12/18/2019 05/11/2019 09/29/2018  Decreased Interest 0 0 0  Down, Depressed, Hopeless 0 0 0  PHQ - 2 Score 0 0 0  Altered sleeping - - -  Tired, decreased energy - - -  Change in appetite - - -  Feeling bad or failure about yourself  - - -  Trouble concentrating - - -  Moving slowly or fidgety/restless - - -  Suicidal thoughts - - -  PHQ-9 Score - - -    Social History   Tobacco Use  . Smoking status: Former Smoker    Quit date: 07/19/2014    Years since quitting: 5.4  . Smokeless tobacco: Never Used    Substance Use Topics  . Alcohol use: Yes  . Drug use: No    Review of Systems Per HPI unless specifically indicated above     Objective:    BP (!) 178/90   Pulse 77   Temp 97.8 F (36.6 C) (Oral)   Resp 16   Ht 5\' 2"  (1.575 m)   Wt 126 lb (57.2 kg)   BMI 23.05 kg/m   Wt Readings from Last 3 Encounters:  12/18/19 126 lb (57.2 kg)  06/12/19 117 lb 9.6 oz (53.3 kg)  04/24/19 114 lb (51.7 kg)    Physical Exam Vitals and nursing note reviewed.  Constitutional:      General: She is not in acute distress.    Appearance: She is well-developed. She is not diaphoretic.     Comments: Well-appearing, comfortable, cooperative  HENT:     Head: Normocephalic and atraumatic.  Eyes:     General:        Right eye: No discharge.        Left eye: No discharge.     Conjunctiva/sclera: Conjunctivae normal.  Neck:     Thyroid: No thyromegaly.  Cardiovascular:     Rate and Rhythm: Normal rate and regular rhythm.     Heart sounds: Normal heart sounds. No murmur.     Comments: No ectopy or irregular rhythm Pulmonary:     Effort: Pulmonary effort is normal. No respiratory distress.     Breath sounds: Normal breath sounds. No wheezing or rales.  Musculoskeletal:        General: Normal range of motion.     Cervical back: Normal range of motion and neck supple.     Comments: Bilateral hand mild joint deformity with RA. No acute swelling or erythema.  Lymphadenopathy:     Cervical: No cervical adenopathy.  Skin:    General: Skin is warm and dry.     Findings: No erythema or rash.  Neurological:     Mental Status: She is alert and oriented to person, place, and time.  Psychiatric:        Behavior: Behavior normal.     Comments: Well groomed, good eye contact, normal speech and thoughts        Results for orders placed or performed in visit on 63/84/53  BASIC METABOLIC PANEL WITH GFR  Result Value Ref Range   Glucose, Bld 99 65 - 99 mg/dL   BUN 24 7 - 25 mg/dL   Creat 0.85  0.60 - 0.93 mg/dL   GFR, Est Non African American 66 > OR = 60 mL/min/1.75m2   GFR, Est African American 77 > OR = 60 mL/min/1.31m2   BUN/Creatinine Ratio NOT APPLICABLE 6 - 22 (calc)   Sodium 139 135 - 146 mmol/L   Potassium 4.2 3.5 - 5.3 mmol/L   Chloride 107 98 - 110 mmol/L   CO2 20 20 - 32 mmol/L   Calcium 9.7 8.6 - 10.4 mg/dL  Hemoglobin A1c  Result Value Ref Range   Hgb A1c MFr Bld 5.2 <5.7 % of total Hgb   Mean Plasma Glucose 103 (calc)   eAG (mmol/L) 5.7 (calc)  Lipid panel  Result Value Ref Range   Cholesterol 178 <200 mg/dL   HDL 71 > OR = 50 mg/dL   Triglycerides 89 <150 mg/dL   LDL Cholesterol (Calc) 89 mg/dL (calc)   Total CHOL/HDL Ratio 2.5 <5.0 (calc)   Non-HDL Cholesterol (Calc) 107 <130 mg/dL (calc)      Assessment & Plan:   Problem List Items Addressed This Visit    Rheumatoid arthritis (HCC)    Stable with some flare up pain swelling joints hands and feet On MTX and Plaquenil Followed by [facility] Rheum has upcoming apt Will draw labs today CBC CMET, rheum can review as well      Prediabetes    Previously well controlled A1c to 5.2, can re-check lab today  Plan:  1. Not on any therapy currently  2. Encourage improved lifestyle - low carb, low sugar diet, reduce portion size, continue improving regular exercise      Relevant Orders   Hemoglobin A1c   Paroxysmal A-fib (HCC)    Stable, without exacerbation Not currently in AFib Controlled HR on diltiazem      Hypothyroidism    Due for labs Prior normal For now continue levothyroxine 52mcg daily, f/u results TSH Free T4      Relevant Orders   TSH   T4, free   Hypertension - Primary    Elevated BP. Declined re-check attributed to stress Paroxysmal atrial fibrillation   Plan:  1. Continue current BP regimen Lisinopril 20mg  daily, Diltiazem 180mg  24hr 2. Encourage improved lifestyle - low sodium diet, regular exercise 3. Continue monitor BP outside office, bring readings to next visit, if  persistently >140/90 or new symptoms notify office sooner      Relevant Orders   CBC with Differential/Platelet   COMPLETE METABOLIC PANEL WITH GFR   Hyperlipidemia   Relevant Orders   Lipid panel  Centrilobular emphysema (HCC)    Stable without exacerbation Chronic problem. On Albuterol Inhaler PRN only Offer maintenance therapy if has issues worse in future.       Other Visit Diagnoses    Needs flu shot       Relevant Orders   Flu Vaccine QUAD High Dose(Fluad) (Completed)   Long-term use of high-risk medication       Relevant Orders   CBC with Differential/Platelet   COMPLETE METABOLIC PANEL WITH GFR      No orders of the defined types were placed in this encounter.    Follow up plan: Return in about 1 year (around 12/17/2020) for Annual Physical.  Nobie Putnam, DO [facility] 12/18/2019, 8:42 AM

## 2019-12-18 NOTE — Assessment & Plan Note (Signed)
Stable with some flare up pain swelling joints hands and feet On MTX and Plaquenil Followed by [facility] Rheum has upcoming apt Will draw labs today CBC CMET, rheum can review as well

## 2019-12-18 NOTE — Patient Instructions (Addendum)
Thank you for coming to the office today.  Follow-up with Rheumatology as planned.  Labs today, stay tuned for results.  Please discuss the swelling and tingling with Rheumatology can be related to RA  Flu shot today.  [place]:  [facility]) [place] [address]  Hours: Monday - Sunday 8:00am to 12:00pm  COVID-19 Vaccines By Appointment Only  Sign up for [place] List  [email or web address] or text "VACCINE" to [phone] or call [phone]   Please schedule a Follow-up Appointment to: Return in about 1 year (around 12/17/2020) for Annual Physical.  If you have any other questions or concerns, please feel free to call the office or send a message through [place]. You may also schedule an earlier appointment if necessary.  Additionally, you may be receiving a survey about your experience at our office within a few days to 1 week by e-mail or mail. We value your feedback.  Nobie Putnam, DO [place]

## 2019-12-18 NOTE — Assessment & Plan Note (Signed)
Stable, without exacerbation Not currently in AFib Controlled HR on diltiazem

## 2019-12-18 NOTE — Assessment & Plan Note (Signed)
Due for labs Prior normal For now continue levothyroxine 73mcg daily, f/u results TSH Free T4

## 2019-12-18 NOTE — Assessment & Plan Note (Signed)
Previously well controlled A1c to 5.2, can re-check lab today  Plan:  1. Not on any therapy currently  2. Encourage improved lifestyle - low carb, low sugar diet, reduce portion size, continue improving regular exercise

## 2019-12-18 NOTE — Assessment & Plan Note (Signed)
Elevated BP. Declined re-check attributed to stress Paroxysmal atrial fibrillation   Plan:  1. Continue current BP regimen Lisinopril 20mg  daily, Diltiazem 180mg  24hr 2. Encourage improved lifestyle - low sodium diet, regular exercise 3. Continue monitor BP outside office, bring readings to next visit, if persistently >140/90 or new symptoms notify office sooner

## 2019-12-18 NOTE — Assessment & Plan Note (Signed)
Stable without exacerbation Chronic problem. On Albuterol Inhaler PRN only Offer maintenance therapy if has issues worse in future.

## 2019-12-19 LAB — CBC WITH DIFFERENTIAL/PLATELET
Absolute Monocytes: 884 cells/uL (ref 200–950)
Basophils Absolute: 108 cells/uL (ref 0–200)
Basophils Relative: 1.9 %
Eosinophils Absolute: 148 cells/uL (ref 15–500)
Eosinophils Relative: 2.6 %
HCT: 37.7 % (ref 35.0–45.0)
Hemoglobin: 12.7 g/dL (ref 11.7–15.5)
Lymphs Abs: 1493 cells/uL (ref 850–3900)
MCH: 32.7 pg (ref 27.0–33.0)
MCHC: 33.7 g/dL (ref 32.0–36.0)
MCV: 97.2 fL (ref 80.0–100.0)
MPV: 12 fL (ref 7.5–12.5)
Monocytes Relative: 15.5 %
Neutro Abs: 3067 cells/uL (ref 1500–7800)
Neutrophils Relative %: 53.8 %
Platelets: 201 10*3/uL (ref 140–400)
RBC: 3.88 10*6/uL (ref 3.80–5.10)
RDW: 13.4 % (ref 11.0–15.0)
Total Lymphocyte: 26.2 %
WBC: 5.7 10*3/uL (ref 3.8–10.8)

## 2019-12-19 LAB — COMPLETE METABOLIC PANEL WITH GFR
AG Ratio: 1.5 (calc) (ref 1.0–2.5)
ALT: 11 U/L (ref 6–29)
AST: 16 U/L (ref 10–35)
Albumin: 4.1 g/dL (ref 3.6–5.1)
Alkaline phosphatase (APISO): 60 U/L (ref 37–153)
BUN/Creatinine Ratio: 21 (calc) (ref 6–22)
BUN: 20 mg/dL (ref 7–25)
CO2: 25 mmol/L (ref 20–32)
Calcium: 9.7 mg/dL (ref 8.6–10.4)
Chloride: 106 mmol/L (ref 98–110)
Creat: 0.97 mg/dL — ABNORMAL HIGH (ref 0.60–0.93)
GFR, Est African American: 65 mL/min/{1.73_m2} (ref >=60)
GFR, Est Non African American: 56 mL/min/{1.73_m2} — ABNORMAL LOW (ref >=60)
Globulin: 2.7 g/dL (calc) (ref 1.9–3.7)
Glucose, Bld: 101 mg/dL — ABNORMAL HIGH (ref 65–99)
Potassium: 4.5 mmol/L (ref 3.5–5.3)
Sodium: 140 mmol/L (ref 135–146)
Total Bilirubin: 0.3 mg/dL (ref 0.2–1.2)
Total Protein: 6.8 g/dL (ref 6.1–8.1)

## 2019-12-19 LAB — LIPID PANEL
Cholesterol: 168 mg/dL (ref <200)
HDL: 69 mg/dL (ref >=50)
LDL Cholesterol (Calc): 85 mg/dL (calc)
Non-HDL Cholesterol (Calc): 99 mg/dL (calc) (ref <130)
Total CHOL/HDL Ratio: 2.4 (calc) (ref <5.0)
Triglycerides: 68 mg/dL (ref <150)

## 2019-12-19 LAB — HEMOGLOBIN A1C
Hgb A1c MFr Bld: 5.5 % of total Hgb (ref <5.7)
Mean Plasma Glucose: 111 (calc)
eAG (mmol/L): 6.2 (calc)

## 2019-12-19 LAB — TSH: TSH: 1.07 mIU/L (ref 0.40–4.50)

## 2019-12-19 LAB — T4, FREE: Free T4: 1 ng/dL (ref 0.8–1.8)

## 2019-12-25 DIAGNOSIS — R208 Other disturbances of skin sensation: Secondary | ICD-10-CM | POA: Insufficient documentation

## 2020-01-14 ENCOUNTER — Encounter: Payer: Self-pay | Admitting: Oncology

## 2020-01-14 ENCOUNTER — Other Ambulatory Visit: Payer: Self-pay

## 2020-01-14 ENCOUNTER — Inpatient Hospital Stay: Payer: Medicare Other | Attending: Oncology | Admitting: Oncology

## 2020-01-14 ENCOUNTER — Inpatient Hospital Stay: Payer: Medicare Other

## 2020-01-14 VITALS — BP 153/80 | Temp 97.5°F | Resp 18 | Wt 122.6 lb

## 2020-01-14 DIAGNOSIS — D472 Monoclonal gammopathy: Secondary | ICD-10-CM | POA: Insufficient documentation

## 2020-01-14 DIAGNOSIS — Z87891 Personal history of nicotine dependence: Secondary | ICD-10-CM | POA: Insufficient documentation

## 2020-01-14 DIAGNOSIS — M069 Rheumatoid arthritis, unspecified: Secondary | ICD-10-CM | POA: Insufficient documentation

## 2020-01-14 DIAGNOSIS — E785 Hyperlipidemia, unspecified: Secondary | ICD-10-CM | POA: Diagnosis not present

## 2020-01-14 DIAGNOSIS — R112 Nausea with vomiting, unspecified: Secondary | ICD-10-CM | POA: Diagnosis not present

## 2020-01-14 DIAGNOSIS — G4700 Insomnia, unspecified: Secondary | ICD-10-CM | POA: Insufficient documentation

## 2020-01-14 DIAGNOSIS — I7300 Raynaud's syndrome without gangrene: Secondary | ICD-10-CM | POA: Diagnosis not present

## 2020-01-14 DIAGNOSIS — I480 Paroxysmal atrial fibrillation: Secondary | ICD-10-CM | POA: Insufficient documentation

## 2020-01-14 DIAGNOSIS — I10 Essential (primary) hypertension: Secondary | ICD-10-CM | POA: Diagnosis not present

## 2020-01-14 DIAGNOSIS — M2550 Pain in unspecified joint: Secondary | ICD-10-CM | POA: Diagnosis not present

## 2020-01-14 DIAGNOSIS — G8929 Other chronic pain: Secondary | ICD-10-CM | POA: Insufficient documentation

## 2020-01-14 DIAGNOSIS — E039 Hypothyroidism, unspecified: Secondary | ICD-10-CM | POA: Diagnosis not present

## 2020-01-14 LAB — CBC WITH DIFFERENTIAL/PLATELET
Abs Immature Granulocytes: 0.02 10*3/uL (ref 0.00–0.07)
Basophils Absolute: 0.1 10*3/uL (ref 0.0–0.1)
Basophils Relative: 1 %
Eosinophils Absolute: 0.1 10*3/uL (ref 0.0–0.5)
Eosinophils Relative: 2 %
HCT: 40.3 % (ref 36.0–46.0)
Hemoglobin: 13 g/dL (ref 12.0–15.0)
Immature Granulocytes: 0 %
Lymphocytes Relative: 31 %
Lymphs Abs: 1.9 10*3/uL (ref 0.7–4.0)
MCH: 32.8 pg (ref 26.0–34.0)
MCHC: 32.3 g/dL (ref 30.0–36.0)
MCV: 101.8 fL — ABNORMAL HIGH (ref 80.0–100.0)
Monocytes Absolute: 0.6 10*3/uL (ref 0.1–1.0)
Monocytes Relative: 10 %
Neutro Abs: 3.5 10*3/uL (ref 1.7–7.7)
Neutrophils Relative %: 56 %
Platelets: 209 10*3/uL (ref 150–400)
RBC: 3.96 MIL/uL (ref 3.87–5.11)
RDW: 14.2 % (ref 11.5–15.5)
WBC: 6.2 10*3/uL (ref 4.0–10.5)
nRBC: 0 % (ref 0.0–0.2)

## 2020-01-14 LAB — COMPREHENSIVE METABOLIC PANEL
ALT: 24 U/L (ref 0–44)
AST: 24 U/L (ref 15–41)
Albumin: 4.5 g/dL (ref 3.5–5.0)
Alkaline Phosphatase: 63 U/L (ref 38–126)
Anion gap: 11 (ref 5–15)
BUN: 19 mg/dL (ref 8–23)
CO2: 21 mmol/L — ABNORMAL LOW (ref 22–32)
Calcium: 9.8 mg/dL (ref 8.9–10.3)
Chloride: 105 mmol/L (ref 98–111)
Creatinine, Ser: 0.89 mg/dL (ref 0.44–1.00)
GFR calc Af Amer: >60 mL/min (ref >60)
GFR calc non Af Amer: >60 mL/min (ref >60)
Glucose, Bld: 103 mg/dL — ABNORMAL HIGH (ref 70–99)
Potassium: 3.8 mmol/L (ref 3.5–5.1)
Sodium: 137 mmol/L (ref 135–145)
Total Bilirubin: 0.6 mg/dL (ref 0.3–1.2)
Total Protein: 7.8 g/dL (ref 6.5–8.1)

## 2020-01-14 NOTE — Progress Notes (Signed)
New evaluation for abnormal labs.

## 2020-01-15 ENCOUNTER — Ambulatory Visit (INDEPENDENT_AMBULATORY_CARE_PROVIDER_SITE_OTHER): Payer: Medicare Other | Admitting: Family Medicine

## 2020-01-15 ENCOUNTER — Encounter: Payer: Self-pay | Admitting: Family Medicine

## 2020-01-15 DIAGNOSIS — R110 Nausea: Secondary | ICD-10-CM | POA: Diagnosis not present

## 2020-01-15 DIAGNOSIS — D472 Monoclonal gammopathy: Secondary | ICD-10-CM | POA: Diagnosis not present

## 2020-01-15 LAB — MICROALBUMIN / CREATININE URINE RATIO
Creatinine, Urine: 86.1 mg/dL
Microalb Creat Ratio: 53 mg/g creat — ABNORMAL HIGH (ref 0–29)
Microalb, Ur: 45.8 ug/mL — ABNORMAL HIGH

## 2020-01-15 LAB — KAPPA/LAMBDA LIGHT CHAINS
Kappa free light chain: 25.2 mg/L — ABNORMAL HIGH (ref 3.3–19.4)
Kappa, lambda light chain ratio: 1 (ref 0.26–1.65)
Lambda free light chains: 25.3 mg/L (ref 5.7–26.3)

## 2020-01-15 LAB — MISC LABCORP TEST (SEND OUT): Labcorp test code: 143000

## 2020-01-15 MED ORDER — PROMETHAZINE HCL 12.5 MG PO TABS: 12.5000 mg | ORAL_TABLET | Freq: Three times a day (TID) | ORAL | 1 refills | Status: DC | PRN

## 2020-01-15 NOTE — Progress Notes (Signed)
Virtual Visit via Telephone The purpose of this virtual visit is to provide medical care while limiting exposure to the novel coronavirus (COVID19) for both patient and office staff.  Consent was obtained for phone visit:  Yes.   Answered questions that patient had about telehealth interaction:  Yes.   I discussed the limitations, risks, security and privacy concerns of performing an evaluation and management service by telephone. I also discussed with the patient that there may be a patient responsible charge related to this service. The patient expressed understanding and agreed to proceed.  Patient Location: Home Provider Location: [facility])  ---------------------------------------------------------------------- Chief Complaint  Patient presents with  . Nausea    onset couple of months     S: Reviewed CMA documentation. 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 Reports that symptoms started within past 1 month or more with nausea and occasional rare vomit episode, without blood. She otherwise is feeling alright. She tries to keep well hydrated with water. Eats bland diet on days flare up. Some occasional diarrhea loose stools.  Denies any high risk travel to areas of current concern for COVID19. Denies any known or suspected exposure to person with or possibly with COVID19.  Denies any fevers, chills, sweats, body ache, cough, shortness of breath, sinus pain or pressure, headache, abdominal pain  Past Medical History:  Diagnosis Date  . Allergy   . Arthritis   . Bunion   . Chronic sinusitis   . High risk medication use   . Hyperlipidemia   . Hypertension   . Hypothyroidism   . Insomnia   . Macular degeneration   . Osteoporosis   . Paroxysmal A-fib ([place])   . Prediabetes   . Rheumatoid arthritis ([place])    Social  History   Tobacco Use  . Smoking status: Former Smoker    Quit date: 07/19/2014    Years since quitting: 5.4  . Smokeless tobacco: Never Used  Substance Use Topics  . Alcohol use: Yes  . Drug use: No    Current Outpatient Medications:  .  albuterol (PROAIR HFA) 108 (90 Base) MCG/ACT inhaler, INHALE 1 TO 2 PUFFS INTO THE LUNGS EVERY 6 HOURS AS NEEDED FOR WHEEZING OR SHORTNESS OF BREATH, Disp: 8.5 g, Rfl: 2 .  cromolyn (NASALCROM) 5.2 MG/ACT nasal spray, Place 1 spray into both nostrils as needed for allergies., Disp: , Rfl:  .  diltiazem (DILACOR XR) 180 MG 24 hr capsule, Take 1 capsule (180 mg total) by mouth daily., Disp: 90 capsule, Rfl: 1 .  fluticasone (FLONASE) 50 MCG/ACT nasal spray, Place 2 sprays into both nostrils daily., Disp: 16 g, Rfl: 11 .  folic acid (FOLVITE) 1 MG tablet, Take 1 mg by mouth daily., Disp: , Rfl:  .  gabapentin (NEURONTIN) 100 MG capsule, TAKE 1 CAPSULE BY MOUTH TWICE DAILY AS NEEDED THEN TAKE 3 CAPSULES BY MOUTH AT BEDTIME, Disp: 450 capsule, Rfl: 0 .  levothyroxine (SYNTHROID) 25 MCG tablet, TAKE 1 TABLET(25 MCG) BY MOUTH DAILY BEFORE BREAKFAST, Disp: 90 tablet, Rfl: 1 .  lisinopril (ZESTRIL) 20 MG tablet, TAKE 1 TABLET(20 MG) BY MOUTH DAILY, Disp: 90 tablet, Rfl: 1 .  methotrexate 50 MG/2ML injection, Take 0.8 ml once a week , 12 weeks, Disp: , Rfl:  .  omeprazole (PRILOSEC) 20 MG capsule, TAKE 1 CAPSULE(20 MG) BY MOUTH  DAILY, Disp: 90 capsule, Rfl: 3 .  traZODone (DESYREL) 50 MG tablet, TAKE 1 TABLET(50 MG) BY MOUTH AT BEDTIME, Disp: 90 tablet, Rfl: 1 .  alendronate (FOSAMAX) 70 MG tablet, TAKE 1 TABLET BY MOUTH ONCE A WEEK, WITH A FULL GLASS OF WATER ON AN EMPTY STOMACH, REMAIN UPRIGHT FOR 30 MINUTES (Patient not taking: Reported on 01/15/2020), Disp: 12 tablet, Rfl: 0 .  hydroxychloroquine (PLAQUENIL) 200 MG tablet, Take 300 mg daily (1 tab and a half ) daily, 90 days, Disp: , Rfl:  .  promethazine (PHENERGAN) 12.5 MG tablet, Take 1-2 tablets (12.5-25 mg  total) by mouth every 8 (eight) hours as needed for nausea or vomiting., Disp: 30 tablet, Rfl: 1  Depression screen [facility] 2/9 01/15/2020 12/18/2019 05/11/2019  Decreased Interest 0 0 0  Down, Depressed, Hopeless 0 0 0  PHQ - 2 Score 0 0 0  Altered sleeping - - -  Tired, decreased energy - - -  Change in appetite - - -  Feeling bad or failure about yourself  - - -  Trouble concentrating - - -  Moving slowly or fidgety/restless - - -  Suicidal thoughts - - -  PHQ-9 Score - - -    No flowsheet data found.  -------------------------------------------------------------------------- O: No physical exam performed due to remote telephone encounter.  Lab results reviewed.  Recent Results (from the past 2160 hour(s))  CBC with Differential/Platelet     Status: None   Collection Time: 12/18/19  9:05 AM  Result Value Ref Range   WBC 5.7 3.8 - 10.8 Thousand/uL   RBC 3.88 3.80 - 5.10 Million/uL   Hemoglobin 12.7 11.7 - 15.5 g/dL   HCT 37.7 35.0 - 45.0 %   MCV 97.2 80.0 - 100.0 fL   MCH 32.7 27.0 - 33.0 pg   MCHC 33.7 32.0 - 36.0 g/dL   RDW 13.4 11.0 - 15.0 %   Platelets 201 140 - 400 Thousand/uL   MPV 12.0 7.5 - 12.5 fL   Neutro Abs 3,067 1,500 - 7,800 cells/uL   Lymphs Abs 1,493 850 - 3,900 cells/uL   Absolute Monocytes 884 200 - 950 cells/uL   Eosinophils Absolute 148 15 - 500 cells/uL   Basophils Absolute 108 0 - 200 cells/uL   Neutrophils Relative % 53.8 %   Total Lymphocyte 26.2 %   Monocytes Relative 15.5 %   Eosinophils Relative 2.6 %   Basophils Relative 1.9 %  COMPLETE METABOLIC PANEL WITH GFR     Status: Abnormal   Collection Time: 12/18/19  9:05 AM  Result Value Ref Range   Glucose, Bld 101 (H) 65 - 99 mg/dL    Comment: .            Fasting reference interval . For someone without known diabetes, a glucose value between 100 and 125 mg/dL is consistent with prediabetes and should be confirmed with a follow-up test. .    BUN 20 7 - 25 mg/dL   Creat 0.97 (H) 0.60 -  0.93 mg/dL    Comment: For patients >53 years of age, the reference limit for Creatinine is approximately 13% higher for people identified as African-American. .    GFR, Est Non African American 56 (L) > OR = 60 mL/min/1.93m2   GFR, Est African American 65 > OR = 60 mL/min/1.71m2   BUN/Creatinine Ratio 21 6 - 22 (calc)   Sodium 140 135 - 146 mmol/L   Potassium 4.5 3.5 - 5.3 mmol/L   Chloride 106 98 -  110 mmol/L   CO2 25 20 - 32 mmol/L   Calcium 9.7 8.6 - 10.4 mg/dL   Total Protein 6.8 6.1 - 8.1 g/dL   Albumin 4.1 3.6 - 5.1 g/dL   Globulin 2.7 1.9 - 3.7 g/dL (calc)   AG Ratio 1.5 1.0 - 2.5 (calc)   Total Bilirubin 0.3 0.2 - 1.2 mg/dL   Alkaline phosphatase (APISO) 60 37 - 153 U/L   AST 16 10 - 35 U/L   ALT 11 6 - 29 U/L  Lipid panel     Status: None   Collection Time: 12/18/19  9:05 AM  Result Value Ref Range   Cholesterol 168 <200 mg/dL   HDL 69 > OR = 50 mg/dL   Triglycerides 68 <150 mg/dL   LDL Cholesterol (Calc) 85 mg/dL (calc)    Comment: Reference range: <100 . Desirable range <100 mg/dL for primary prevention;   <70 mg/dL for patients with CHD or diabetic patients  with > or = 2 CHD risk factors. Marland Kitchen LDL-C is now calculated using the Martin-Hopkins  calculation, which is a validated novel method providing  better accuracy than the Friedewald equation in the  estimation of LDL-C.  Cresenciano Genre et al. Annamaria Helling. 3810;175(10): 2061-2068  (http://education.[employer].com/faq/FAQ164)    Total CHOL/HDL Ratio 2.4 <5.0 (calc)   Non-HDL Cholesterol (Calc) 99 <130 mg/dL (calc)    Comment: For patients with diabetes plus 1 major ASCVD risk  factor, treating to a non-HDL-C goal of <100 mg/dL  (LDL-C of <70 mg/dL) is considered a therapeutic  option.   TSH     Status: None   Collection Time: 12/18/19  9:05 AM  Result Value Ref Range   TSH 1.07 0.40 - 4.50 mIU/L  T4, free     Status: None   Collection Time: 12/18/19  9:05 AM  Result Value Ref Range   Free T4 1.0 0.8 - 1.8  ng/dL  Hemoglobin A1c     Status: None   Collection Time: 12/18/19  9:05 AM  Result Value Ref Range   Hgb A1c MFr Bld 5.5 <5.7 % of total Hgb    Comment: For the purpose of screening for the presence of diabetes: . <5.7%       Consistent with the absence of diabetes 5.7-6.4%    Consistent with increased risk for diabetes             (prediabetes) > or =6.5%  Consistent with diabetes . This assay result is consistent with a decreased risk of diabetes. . Currently, no consensus exists regarding use of hemoglobin A1c for diagnosis of diabetes in children. . According to American Diabetes Association (ADA) guidelines, hemoglobin A1c <7.0% represents optimal control in non-pregnant diabetic patients. Different metrics may apply to specific patient populations.  Standards of Medical Care in Diabetes(ADA). .    Mean Plasma Glucose 111 (calc)   eAG (mmol/L) 6.2 (calc)  Microalbumin / creatinine urine ratio     Status: Abnormal   Collection Time: 01/14/20 11:26 AM  Result Value Ref Range   Microalb, Ur 45.8 (H) Not Estab. ug/mL   Microalb Creat Ratio 53 (H) 0 - 29 mg/g creat    Comment: (NOTE)                       Normal:                0 -  29  Moderately increased: 30 - 300                       Severely increased:       >300 Performed At: [facility] [place], [place] [id number] Rush Farmer MD KC:[id number]    Creatinine, Urine 86.1 Not Estab. mg/dL  Comprehensive metabolic panel     Status: Abnormal   Collection Time: 01/14/20 12:00 PM  Result Value Ref Range   Sodium 137 135 - 145 mmol/L   Potassium 3.8 3.5 - 5.1 mmol/L   Chloride 105 98 - 111 mmol/L   CO2 21 (L) 22 - 32 mmol/L   Glucose, Bld 103 (H) 70 - 99 mg/dL    Comment: Glucose reference range applies only to samples taken after fasting for at least 8 hours.   BUN 19 8 - 23 mg/dL   Creatinine, Ser 0.89 0.44 - 1.00 mg/dL   Calcium 9.8 8.9 - 10.3 mg/dL   Total  Protein 7.8 6.5 - 8.1 g/dL   Albumin 4.5 3.5 - 5.0 g/dL   AST 24 15 - 41 U/L   ALT 24 0 - 44 U/L   Alkaline Phosphatase 63 38 - 126 U/L   Total Bilirubin 0.6 0.3 - 1.2 mg/dL   GFR calc non Af Amer >60 >60 mL/min   GFR calc Af Amer >60 >60 mL/min   Anion gap 11 5 - 15    Comment: Performed at [facility], [address] [place], [place] [address]  CBC with Differential/Platelet     Status: Abnormal   Collection Time: 01/14/20 12:00 PM  Result Value Ref Range   WBC 6.2 4.0 - 10.5 K/uL   RBC 3.96 3.87 - 5.11 MIL/uL   Hemoglobin 13.0 12.0 - 15.0 g/dL   HCT 40.3 36.0 - 46.0 %   MCV 101.8 (H) 80.0 - 100.0 fL   MCH 32.8 26.0 - 34.0 pg   MCHC 32.3 30.0 - 36.0 g/dL   RDW 14.2 11.5 - 15.5 %   Platelets 209 150 - 400 K/uL   nRBC 0.0 0.0 - 0.2 %   Neutrophils Relative % 56 %   Neutro Abs 3.5 1.7 - 7.7 K/uL   Lymphocytes Relative 31 %   Lymphs Abs 1.9 0.7 - 4.0 K/uL   Monocytes Relative 10 %   Monocytes Absolute 0.6 0.1 - 1.0 K/uL   Eosinophils Relative 2 %   Eosinophils Absolute 0.1 0.0 - 0.5 K/uL   Basophils Relative 1 %   Basophils Absolute 0.1 0.0 - 0.1 K/uL   Immature Granulocytes 0 %   Abs Immature Granulocytes 0.02 0.00 - 0.07 K/uL    Comment: Performed at [facility], [address] [place], [place] [address]  Miscellaneous [facility] test (send-out)     Status: None   Collection Time: 01/14/20 12:01 PM  Result Value Ref Range   [facility] test code 143000    [facility] test name NT PROBNP     Comment: Performed at [facility], [address] [place], [place] [address]   Misc [facility] result COMMENT     Comment: (NOTE) Test Ordered: 143000 NT-proBNP NT-proBNP                      309              pg/mL    BN     Reference Range: 0-738  The following cut-points have been suggested for the use of proBNP for the diagnostic evaluation of heart failure (HF) in patients with acute dyspnea: Modality                     Age            Optimal Cut                           (years)            Point ------------------------------------------------------ Diagnosis (rule in HF)        <50            450 pg/mL                          50 - 75            900 pg/mL                              >75           1800 pg/mL Exclusion (rule out HF)  Age independent     300 pg/mL Performed At: [facility] [place], [place] [id number] Rush Farmer MD GM:[id number]     -------------------------------------------------------------------------- A&P:  Problem List Items Addressed This Visit    None    Visit Diagnoses    MGUS (monoclonal gammopathy of unknown significance)    -  Primary   Nausea       Relevant Medications   promethazine (PHENERGAN) 12.5 MG tablet     Clinical nausea and some diarrhea, functional GI symptoms related to other underlying conditions Reviewed MGUS and awaiting rest of [facility] records / testing Will treat with Phenergan PRN now 12.5mg  can use 1-2 pills PRN as discussed caution sedation Will avoid Zofran due to risk of QT Prolong due to Hydroxychloroquine as well. May try OTC Imodium PRN use with caution Improve hydration if having GI losses, diet as advised   Meds ordered this encounter  Medications  . promethazine (PHENERGAN) 12.5 MG tablet    Sig: Take 1-2 tablets (12.5-25 mg total) by mouth every 8 (eight) hours as needed for nausea or vomiting.    Dispense:  30 tablet    Refill:  1    Follow-up: As needed  Patient verbalizes understanding with the above medical recommendations including the limitation of remote medical advice.  Specific follow-up and call-back criteria were given for patient to follow-up or seek medical care more urgently if needed.   - Time spent in direct consultation with patient on phone: 8 minutes  Nobie Putnam, [facility] 01/15/2020, 1:58 PM

## 2020-01-15 NOTE — Patient Instructions (Addendum)
Start with Phenergan, use it as needed, 1-2 pills per dose Try OTC Imodium as needed, caution with over using it, but can help slow down diarrhea Stay hydrated as you are  Follow up if needed  Please schedule a Follow-up Appointment to: Return if symptoms worsen or fail to improve.  If you have any other questions or concerns, please feel free to call the office or send a message through [place]. You may also schedule an earlier appointment if necessary.  Additionally, you may be receiving a survey about your experience at our office within a few days to 1 week by e-mail or mail. We value your feedback.  Nobie Putnam, DO [place]

## 2020-01-16 NOTE — Progress Notes (Signed)
Hematology/Oncology Consult note [facility] [phone] [phone]   Patient Care Team: Olin Hauser, DO as PCP - General (Family Medicine)  REFERRING PROVIDER: Annalee Genta, Christele*  CHIEF COMPLAINTS/REASON FOR VISIT:  Evaluation of   HISTORY OF PRESENTING ILLNESS:   Alexis Owens is a  79 y.o.  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 Meda Coffee for rheumatoid arthritis.  Patient has positive CCP, arthritis was diagnosed in 2016.  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  Her work-up showed abnormal serum protein electrophoresis, IgG monoclonal lambda light chain.  Patient was referred to cancer center for further evaluation. Chronic joint pain. She complains feeling nauseated for the past 1 months.  Some diarrhea as well.  Review of Systems  Constitutional: Negative for appetite change, chills, fatigue and fever.  HENT:   Negative for hearing loss and voice change.   Eyes: Negative for eye problems.  Respiratory: Negative for chest tightness and cough.   Cardiovascular: Negative for chest pain.  Gastrointestinal: Negative for abdominal distention, abdominal pain and blood in stool.  Endocrine: Negative for hot flashes.  Genitourinary: Negative for difficulty urinating and frequency.   Musculoskeletal: Positive for arthralgias.  Skin: Negative for itching and rash.  Neurological: Negative for extremity weakness.  Hematological: Negative for adenopathy.  Psychiatric/Behavioral: Negative for confusion.    MEDICAL HISTORY:  Past Medical History:  Diagnosis Date  . Allergy   . Arthritis   . Bunion   . Chronic sinusitis   . High risk medication use   . Hyperlipidemia   . Hypertension   . Hypothyroidism   . Insomnia   . Macular degeneration   . Osteoporosis   . Paroxysmal A-fib ([place])   .  Prediabetes   . Rheumatoid arthritis ([place])     SURGICAL HISTORY: Past Surgical History:  Procedure Laterality Date  . APPENDECTOMY    . 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 LEG SURGERY     Rt leg surgery for Compound fracture of the Right tibia     SOCIAL HISTORY: Social History   Socioeconomic History  . Marital status: Married    Spouse name: Not on file  . Number of children: Not on file  . Years of education: Not on file  . Highest education level: Not on file  Occupational History  . Not on file  Tobacco Use  . Smoking status: Former Smoker    Quit date: 07/19/2014    Years since quitting: 5.4  . Smokeless tobacco: Never Used  Substance and Sexual Activity  . Alcohol use: Yes  . Drug use: No  . Sexual activity: Not on file  Other Topics Concern  . Not on file  Social History Narrative  . Not on file   Social Determinants of Health   Financial Resource Strain:   . Difficulty of Paying Living Expenses: Not on file  Food Insecurity:   . Worried About Charity fundraiser in the Last Year: Not on file  . Ran Out of Food in the Last Year: Not on file  Transportation Needs:   . Lack of Transportation (Medical): Not on file  . Lack of Transportation (Non-Medical): Not on file  Physical Activity:   . Days of Exercise per Week: Not on  file  . Minutes of Exercise per Session: Not on file  Stress:   . Feeling of Stress : Not on file  Social Connections:   . Frequency of Communication with Friends and Family: Not on file  . Frequency of Social Gatherings with Friends and Family: Not on file  . Attends Religious Services: Not on file  . Active Member of Clubs or Organizations: Not on file  . Attends Archivist Meetings: Not on file  . Marital Status: Not on file  Intimate Partner Violence:   . Fear of Current or  Ex-Partner: Not on file  . Emotionally Abused: Not on file  . Physically Abused: Not on file  . Sexually Abused: Not on file    FAMILY HISTORY: Family History  Problem Relation Age of Onset  . Breast cancer Sister   . Heart disease Brother   . Diabetes Brother     ALLERGIES:  is allergic to penicillin g; penicillins; sulfa antibiotics; and sulfasalazine.  MEDICATIONS:  Current Outpatient Medications  Medication Sig Dispense Refill  . albuterol (PROAIR HFA) 108 (90 Base) MCG/ACT inhaler INHALE 1 TO 2 PUFFS INTO THE LUNGS EVERY 6 HOURS AS NEEDED FOR WHEEZING OR SHORTNESS OF BREATH 8.5 g 2  . alendronate (FOSAMAX) 70 MG tablet TAKE 1 TABLET BY MOUTH ONCE A WEEK, WITH A FULL GLASS OF WATER ON AN EMPTY STOMACH, REMAIN UPRIGHT FOR 30 MINUTES (Patient not taking: Reported on 01/15/2020) 12 tablet 0  . diltiazem (DILACOR XR) 180 MG 24 hr capsule Take 1 capsule (180 mg total) by mouth daily. 90 capsule 1  . fluticasone (FLONASE) 50 MCG/ACT nasal spray Place 2 sprays into both nostrils daily. 16 g 11  . folic acid (FOLVITE) 1 MG tablet Take 1 mg by mouth daily.    Marland Kitchen gabapentin (NEURONTIN) 100 MG capsule TAKE 1 CAPSULE BY MOUTH TWICE DAILY AS NEEDED THEN TAKE 3 CAPSULES BY MOUTH AT BEDTIME 450 capsule 0  . hydroxychloroquine (PLAQUENIL) 200 MG tablet Take 300 mg daily (1 tab and a half ) daily, 90 days    . levothyroxine (SYNTHROID) 25 MCG tablet TAKE 1 TABLET(25 MCG) BY MOUTH DAILY BEFORE BREAKFAST 90 tablet 1  . lisinopril (ZESTRIL) 20 MG tablet TAKE 1 TABLET(20 MG) BY MOUTH DAILY 90 tablet 1  . methotrexate 50 MG/2ML injection Take 0.8 ml once a week , 12 weeks    . omeprazole (PRILOSEC) 20 MG capsule TAKE 1 CAPSULE(20 MG) BY MOUTH DAILY 90 capsule 3  . traZODone (DESYREL) 50 MG tablet TAKE 1 TABLET(50 MG) BY MOUTH AT BEDTIME 90 tablet 1  . cromolyn (NASALCROM) 5.2 MG/ACT nasal spray Place 1 spray into both nostrils as needed for allergies.    . promethazine (PHENERGAN) 12.5 MG tablet Take 1-2  tablets (12.5-25 mg total) by mouth every 8 (eight) hours as needed for nausea or vomiting. 30 tablet 1   No current facility-administered medications for this visit.     PHYSICAL EXAMINATION: ECOG PERFORMANCE STATUS: 1 - Symptomatic but completely ambulatory Vitals:   01/14/20 1122  BP: (!) 153/80  Resp: 18  Temp: (!) 97.5 F (36.4 C)   Filed Weights   01/14/20 1122  Weight: 122 lb 9.6 oz (55.6 kg)    Physical Exam Constitutional:      General: She is not in acute distress. HENT:     Head: Normocephalic and atraumatic.  Eyes:     General: No scleral icterus. Cardiovascular:     Rate and Rhythm: Normal rate  and regular rhythm.     Heart sounds: Normal heart sounds.  Pulmonary:     Effort: Pulmonary effort is normal. No respiratory distress.     Breath sounds: No wheezing.  Abdominal:     General: Bowel sounds are normal. There is no distension.     Palpations: Abdomen is soft.  Musculoskeletal:        General: No deformity. Normal range of motion.     Cervical back: Normal range of motion and neck supple.  Skin:    General: Skin is warm and dry.     Findings: No erythema or rash.  Neurological:     Mental Status: She is alert and oriented to person, place, and time. Mental status is at baseline.     Cranial Nerves: No cranial nerve deficit.     Coordination: Coordination normal.  Psychiatric:        Mood and Affect: Mood normal.     LABORATORY DATA:  I have reviewed the data as listed Lab Results  Component Value Date   WBC 6.2 01/14/2020   HGB 13.0 01/14/2020   HCT 40.3 01/14/2020   MCV 101.8 (H) 01/14/2020   PLT 209 01/14/2020   Recent Labs    06/12/19 1035 12/18/19 0905 01/14/20 1200  NA 139 140 137  K 4.2 4.5 3.8  CL 107 106 105  CO2 20 25 21*  GLUCOSE 99 101* 103*  BUN 24 20 19   CREATININE 0.85 0.97* 0.89  CALCIUM 9.7 9.7 9.8  GFRNONAA 66 56* >60  GFRAA 77 65 >60  PROT  --  6.8 7.8  ALBUMIN  --   --  4.5  AST  --  16 24  ALT  --  11  24  ALKPHOS  --   --  63  BILITOT  --  0.3 0.6   Iron/TIBC/Ferritin/ %Sat No results found for: IRON, TIBC, FERRITIN, IRONPCTSAT    RADIOGRAPHIC STUDIES: I have personally reviewed the radiological images as listed and agreed with the findings in the report.  No results found.    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 Non-intractable vomiting with nausea, unspecified vomiting type   3. History of tobacco use   Previous labs at [facility] clinic were reviewed and discussed with patient.   I discussed with patient about the diagnosis of IgG MGUS which is an asymptomatic condition which has a small risk of progression to smoldering multiple myeloma and to symptomatic multiple myeloma. Less frequently, these patients progress to AL amyloidosis, light chain deposition disease, or another lymphoproliferative disorder.  Check hearing microalbumin/creatinine, CBC, CMP.  Light chain ratio,NT-proBNP. For now I recommend observation.   #History of extensive smoking history.  She stopped smoking 2015.  Refer to lung cancer screening program  nausea, etiology unknown.  Discussed with patient that I do not think nausea is related to MGUS. Check CMP today.  Recommend patient to discuss with primary care provider if symptoms persist.  Encourage oral hydration. Orders Placed This Encounter  Procedures  . Microalbumin / creatinine urine ratio  . Miscellaneous [facility] test (send-out)    Standing Status:   Future    Number of Occurrences:   1    Standing Expiration Date:   01/13/2021    Order Specific Question:   Test name / description:    Answer:   NT-proBNP 143000  . Kappa/lambda light chains    Standing Status:   Future    Number  of Occurrences:   1    Standing Expiration Date:   01/13/2021  . Multiple Myeloma Panel (SPEP&IFE w/QIG)    Standing Status:   Future    Number of Occurrences:   1    Standing Expiration Date:   01/13/2021  . CBC with  Differential/Platelet    Standing Status:   Future    Number of Occurrences:   1    Standing Expiration Date:   01/13/2021  . Comprehensive metabolic panel    Standing Status:   Future    Number of Occurrences:   1    Standing Expiration Date:   01/13/2021    All questions were answered. The patient knows to call the clinic with any problems questions or concerns.  cc Behalal-Bock, Christele*    Return of visit: 3 months Thank you for this kind referral and the opportunity to participate in the care of this patient. A copy of today's note is routed to referring provider     Earlie Server, MD, PhD Hematology Oncology [facility] Pager- [phone] 01/16/2020

## 2020-01-18 LAB — MULTIPLE MYELOMA PANEL, SERUM
Albumin SerPl Elph-Mcnc: 4.1 g/dL (ref 2.9–4.4)
Albumin/Glob SerPl: 1.3 (ref 0.7–1.7)
Alpha 1: 0.2 g/dL (ref 0.0–0.4)
Alpha2 Glob SerPl Elph-Mcnc: 0.7 g/dL (ref 0.4–1.0)
B-Globulin SerPl Elph-Mcnc: 1 g/dL (ref 0.7–1.3)
Gamma Glob SerPl Elph-Mcnc: 1.3 g/dL (ref 0.4–1.8)
Globulin, Total: 3.2 g/dL (ref 2.2–3.9)
IgA: 143 mg/dL (ref 64–422)
IgG (Immunoglobin G), Serum: 1110 mg/dL (ref 586–1602)
IgM (Immunoglobulin M), Srm: 264 mg/dL — ABNORMAL HIGH (ref 26–217)
M Protein SerPl Elph-Mcnc: 0.4 g/dL — ABNORMAL HIGH
Total Protein ELP: 7.3 g/dL (ref 6.0–8.5)

## 2020-01-20 ENCOUNTER — Encounter: Payer: Self-pay | Admitting: Oncology

## 2020-01-20 DIAGNOSIS — Z87891 Personal history of nicotine dependence: Secondary | ICD-10-CM

## 2020-01-21 NOTE — Telephone Encounter (Signed)
Received referral for low dose lung cancer screening CT scan. Message left at phone number listed in EMR for patient to call me back to facilitate scheduling scan.  

## 2020-01-21 NOTE — Addendum Note (Signed)
Addended by: Lieutenant Diego on: 01/21/2020 11:58 AM   Modules accepted: Orders

## 2020-01-21 NOTE — Telephone Encounter (Signed)
Received referral for initial lung cancer screening scan. Contacted patient and obtained smoking history,(former, quit 07/19/14, 57 pack year) as well as answering questions related to screening process. Patient denies signs of lung cancer such as weight loss or hemoptysis. Patient denies comorbidity that would prevent curative treatment if lung cancer were found. Patient is scheduled for shared decision making visit and CT scan on 01/29/20.

## 2020-01-29 ENCOUNTER — Inpatient Hospital Stay (HOSPITAL_BASED_OUTPATIENT_CLINIC_OR_DEPARTMENT_OTHER): Payer: Medicare Other | Admitting: Nurse Practitioner

## 2020-01-29 ENCOUNTER — Other Ambulatory Visit: Payer: Self-pay

## 2020-01-29 ENCOUNTER — Ambulatory Visit
Admission: RE | Admit: 2020-01-29 | Discharge: 2020-01-29 | Disposition: A | Discharge status: Home / Self Care | Payer: Medicare Other | Source: Ambulatory Visit | Attending: Nurse Practitioner | Admitting: Nurse Practitioner

## 2020-01-29 DIAGNOSIS — Z122 Encounter for screening for malignant neoplasm of respiratory organs: Secondary | ICD-10-CM | POA: Insufficient documentation

## 2020-01-29 DIAGNOSIS — Z87891 Personal history of nicotine dependence: Secondary | ICD-10-CM | POA: Insufficient documentation

## 2020-01-29 DIAGNOSIS — J439 Emphysema, unspecified: Secondary | ICD-10-CM | POA: Insufficient documentation

## 2020-01-29 DIAGNOSIS — R911 Solitary pulmonary nodule: Secondary | ICD-10-CM | POA: Insufficient documentation

## 2020-01-29 DIAGNOSIS — I700 Atherosclerosis of aorta: Secondary | ICD-10-CM | POA: Insufficient documentation

## 2020-02-02 ENCOUNTER — Telehealth: Payer: Self-pay | Admitting: *Deleted

## 2020-02-02 NOTE — Telephone Encounter (Signed)
After discussion with pulmonology, contacted patient and Notified patient of LDCT lung cancer screening program results with recommendation for 3 month follow up imaging. Also notified of incidental findings noted below and is encouraged to discuss further with PCP who will receive a copy of this note and/or the CT report. Patient verbalizes understanding and reports she had a nodule seen on a scan from 7 years ago at another health system. She will bring the disk and interpretation for evaluation.   IMPRESSION: Lung-RADS 4A, suspicious. Follow up low-dose chest CT without contrast in 3 months (please use the following order, "CT CHEST LCS NODULE FOLLOW-UP W/O CM") is recommended. Alternatively, PET may be considered.  13.9 mm rounded nodule in the left upper lobe.  Aortic Atherosclerosis (ICD10-I70.0) and Emphysema (ICD10

## 2020-02-23 ENCOUNTER — Ambulatory Visit
Admission: RE | Admit: 2020-02-23 | Discharge: 2020-02-23 | Disposition: A | Discharge status: Home / Self Care | Payer: Self-pay | Source: Ambulatory Visit | Attending: Oncology | Admitting: Oncology

## 2020-02-23 ENCOUNTER — Other Ambulatory Visit: Payer: Self-pay | Admitting: *Deleted

## 2020-02-23 ENCOUNTER — Other Ambulatory Visit: Payer: Self-pay | Admitting: Oncology

## 2020-02-23 DIAGNOSIS — R918 Other nonspecific abnormal finding of lung field: Secondary | ICD-10-CM

## 2020-02-23 DIAGNOSIS — R079 Chest pain, unspecified: Secondary | ICD-10-CM

## 2020-02-23 DIAGNOSIS — R0602 Shortness of breath: Secondary | ICD-10-CM

## 2020-02-25 ENCOUNTER — Telehealth: Payer: Self-pay | Admitting: *Deleted

## 2020-02-25 NOTE — Telephone Encounter (Signed)
Obtained CT of chest from 7 years ago at an outside facility and comparing with most recent lung screening scan. Noted nodule in question was present and about 7 to 8 mm in size 7 years ago. Now it measures at 13 mm. After obtaining pulmonary opinion regarding follow up, patient notified that recommendation is for a 6 month follow up instead of the prior 3 month recommendation. Patient verbalizes understanding.

## 2020-03-17 ENCOUNTER — Other Ambulatory Visit: Payer: Self-pay | Admitting: Nurse Practitioner

## 2020-03-17 DIAGNOSIS — I10 Essential (primary) hypertension: Secondary | ICD-10-CM

## 2020-03-17 DIAGNOSIS — E039 Hypothyroidism, unspecified: Secondary | ICD-10-CM

## 2020-03-25 DIAGNOSIS — Z79899 Other long term (current) drug therapy: Secondary | ICD-10-CM | POA: Diagnosis not present

## 2020-03-25 DIAGNOSIS — M059 Rheumatoid arthritis with rheumatoid factor, unspecified: Secondary | ICD-10-CM | POA: Diagnosis not present

## 2020-04-14 ENCOUNTER — Other Ambulatory Visit: Payer: Self-pay | Admitting: Family Medicine

## 2020-04-14 DIAGNOSIS — R110 Nausea: Secondary | ICD-10-CM

## 2020-04-14 NOTE — Telephone Encounter (Signed)
Requested medication (s) are due for refill today: yes  Requested medication (s) are on the active medication list: yes  Last refill:  01/15/20  Future visit scheduled: yes  Notes to clinic:  refill not delegated per protocol     Requested Prescriptions  Pending Prescriptions Disp Refills   promethazine (PHENERGAN) 12.5 MG tablet [Pharmacy Med Name: PROMETHAZINE 12.5MG  TABLETS] 30 tablet 1    Sig: TAKE 1 TO 2 TABLETS(12.5 TO 25 MG) BY MOUTH EVERY 8 HOURS AS NEEDED FOR NAUSEA OR VOMITING      Not Delegated - Gastroenterology: Antiemetics Failed - 04/14/2020 11:03 AM      Failed - This refill cannot be delegated      Passed - Valid encounter within last 6 months    Recent Outpatient Visits           3 months ago MGUS (monoclonal gammopathy of unknown significance)   [place], DO   3 months ago Essential hypertension   [place], DO   10 months ago Essential hypertension   [facility] Mikey College, NP   11 months ago Tick-borne fever   [place], DO   1 year ago Cellulitis of left lower extremity   [facility] Mikey College, NP       Future Appointments             In 8 months [facility], [employer] [facility]

## 2020-04-15 ENCOUNTER — Inpatient Hospital Stay: Payer: Medicare Other | Attending: Oncology

## 2020-04-15 ENCOUNTER — Other Ambulatory Visit: Payer: Self-pay

## 2020-04-15 DIAGNOSIS — D472 Monoclonal gammopathy: Secondary | ICD-10-CM

## 2020-04-15 DIAGNOSIS — R112 Nausea with vomiting, unspecified: Secondary | ICD-10-CM

## 2020-04-15 DIAGNOSIS — M069 Rheumatoid arthritis, unspecified: Secondary | ICD-10-CM | POA: Diagnosis not present

## 2020-04-15 DIAGNOSIS — I7300 Raynaud's syndrome without gangrene: Secondary | ICD-10-CM | POA: Diagnosis not present

## 2020-04-15 DIAGNOSIS — Z87891 Personal history of nicotine dependence: Secondary | ICD-10-CM

## 2020-04-15 DIAGNOSIS — R911 Solitary pulmonary nodule: Secondary | ICD-10-CM | POA: Insufficient documentation

## 2020-04-15 LAB — CBC WITH DIFFERENTIAL/PLATELET
Abs Immature Granulocytes: 0.03 10*3/uL (ref 0.00–0.07)
Basophils Absolute: 0.1 10*3/uL (ref 0.0–0.1)
Basophils Relative: 1 %
Eosinophils Absolute: 0.3 10*3/uL (ref 0.0–0.5)
Eosinophils Relative: 5 %
HCT: 36.6 % (ref 36.0–46.0)
Hemoglobin: 12.4 g/dL (ref 12.0–15.0)
Immature Granulocytes: 1 %
Lymphocytes Relative: 28 %
Lymphs Abs: 1.7 10*3/uL (ref 0.7–4.0)
MCH: 33.6 pg (ref 26.0–34.0)
MCHC: 33.9 g/dL (ref 30.0–36.0)
MCV: 99.2 fL (ref 80.0–100.0)
Monocytes Absolute: 0.9 10*3/uL (ref 0.1–1.0)
Monocytes Relative: 15 %
Neutro Abs: 3.1 10*3/uL (ref 1.7–7.7)
Neutrophils Relative %: 50 %
Platelets: 156 10*3/uL (ref 150–400)
RBC: 3.69 MIL/uL — ABNORMAL LOW (ref 3.87–5.11)
RDW: 14.9 % (ref 11.5–15.5)
WBC: 6.1 10*3/uL (ref 4.0–10.5)
nRBC: 0 % (ref 0.0–0.2)

## 2020-04-15 LAB — COMPREHENSIVE METABOLIC PANEL
ALT: 27 U/L (ref 0–44)
AST: 29 U/L (ref 15–41)
Albumin: 4.1 g/dL (ref 3.5–5.0)
Alkaline Phosphatase: 68 U/L (ref 38–126)
Anion gap: 9 (ref 5–15)
BUN: 20 mg/dL (ref 8–23)
CO2: 25 mmol/L (ref 22–32)
Calcium: 9.4 mg/dL (ref 8.9–10.3)
Chloride: 103 mmol/L (ref 98–111)
Creatinine, Ser: 1 mg/dL (ref 0.44–1.00)
GFR calc Af Amer: >60 mL/min (ref >60)
GFR calc non Af Amer: 54 mL/min — ABNORMAL LOW (ref >60)
Glucose, Bld: 90 mg/dL (ref 70–99)
Potassium: 4.1 mmol/L (ref 3.5–5.1)
Sodium: 137 mmol/L (ref 135–145)
Total Bilirubin: 0.7 mg/dL (ref 0.3–1.2)
Total Protein: 7.4 g/dL (ref 6.5–8.1)

## 2020-04-18 LAB — KAPPA/LAMBDA LIGHT CHAINS
Kappa free light chain: 28.5 mg/L — ABNORMAL HIGH (ref 3.3–19.4)
Kappa, lambda light chain ratio: 1.05 (ref 0.26–1.65)
Lambda free light chains: 27.2 mg/L — ABNORMAL HIGH (ref 5.7–26.3)

## 2020-04-19 ENCOUNTER — Encounter: Payer: Self-pay | Admitting: Family Medicine

## 2020-04-19 ENCOUNTER — Ambulatory Visit (INDEPENDENT_AMBULATORY_CARE_PROVIDER_SITE_OTHER): Payer: Medicare Other | Admitting: Family Medicine

## 2020-04-19 ENCOUNTER — Other Ambulatory Visit: Payer: Self-pay

## 2020-04-19 VITALS — BP 157/71 | HR 81 | Temp 97.8°F | Resp 16 | Ht 62.0 in | Wt 129.4 lb

## 2020-04-19 DIAGNOSIS — L97911 Non-pressure chronic ulcer of unspecified part of right lower leg limited to breakdown of skin: Secondary | ICD-10-CM

## 2020-04-19 DIAGNOSIS — I872 Venous insufficiency (chronic) (peripheral): Secondary | ICD-10-CM | POA: Diagnosis not present

## 2020-04-19 DIAGNOSIS — S90522A Blister (nonthermal), left ankle, initial encounter: Secondary | ICD-10-CM | POA: Diagnosis not present

## 2020-04-19 LAB — MULTIPLE MYELOMA PANEL, SERUM
Albumin SerPl Elph-Mcnc: 3.6 g/dL (ref 2.9–4.4)
Albumin/Glob SerPl: 1.2 (ref 0.7–1.7)
Alpha 1: 0.2 g/dL (ref 0.0–0.4)
Alpha2 Glob SerPl Elph-Mcnc: 0.6 g/dL (ref 0.4–1.0)
B-Globulin SerPl Elph-Mcnc: 1 g/dL (ref 0.7–1.3)
Gamma Glob SerPl Elph-Mcnc: 1.2 g/dL (ref 0.4–1.8)
Globulin, Total: 3.1 g/dL (ref 2.2–3.9)
IgA: 139 mg/dL (ref 64–422)
IgG (Immunoglobin G), Serum: 1175 mg/dL (ref 586–1602)
IgM (Immunoglobulin M), Srm: 256 mg/dL — ABNORMAL HIGH (ref 26–217)
M Protein SerPl Elph-Mcnc: 0.6 g/dL — ABNORMAL HIGH
Total Protein ELP: 6.7 g/dL (ref 6.0–8.5)

## 2020-04-19 MED ORDER — FUROSEMIDE 20 MG PO TABS: 20.0000 mg | ORAL_TABLET | Freq: Every day | ORAL | 2 refills | Status: DC | PRN

## 2020-04-19 MED ORDER — MUPIROCIN 2 % EX OINT: 1.0000 "application " | TOPICAL_OINTMENT | Freq: Two times a day (BID) | CUTANEOUS | 1 refills | Status: DC

## 2020-04-19 NOTE — Progress Notes (Signed)
Subjective:    Patient ID: Alexis Owens, female    DOB: 05-Mar-1941, 79 y.o.   MRN: [id number]  Alexis Owens is a 79 y.o. female presenting on 04/19/2020 for left ankle (blister onset week painful) and right foot   HPI   Right Lower Extremity Ulceration / Venous Insufficiency / Left Ankle Blister Reports accidental injury 2 months ago had a lower R leg injury with abrasion on metal cart working in garden had skin tear abrasion R lower leg, she has used hydrogen peroxide and neosporin regularly to help it heal. But it has been slow to heal. Still has scab and has not healed completely. Initially it drained but now no longer draining. Has had poor healing wounds in past. 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 Smoking status: Former Smoker    Quit date: 07/19/2014    Years since quitting: 5.7  . Smokeless tobacco: Never Used  Substance Use Topics  . Alcohol use: Yes  . Drug use: No    Review of Systems Per HPI unless specifically indicated above     Objective:    BP (!) 157/71   Pulse 81   Temp 97.8 F (36.6 C) (Temporal)   Resp 16   Ht 5\' 2"  (1.575 m)   Wt 129 lb 6.4 oz (58.7 kg)   BMI 23.67 kg/m   Wt Readings from Last 3 Encounters:    04/19/20 129 lb 6.4 oz (58.7 kg)  01/29/20 122 lb (55.3 kg)  01/14/20 122 lb 9.6 oz (55.6 kg)    Physical Exam Vitals and nursing note reviewed.  Constitutional:      General: She is not in acute distress.    Appearance: She is well-developed. She is not diaphoretic.     Comments: Well-appearing, comfortable, cooperative  HENT:     Head: Normocephalic and atraumatic.  Eyes:     General:        Right eye: No discharge.        Left eye: No discharge.     Conjunctiva/sclera: Conjunctivae normal.  Cardiovascular:     Rate and Rhythm: Normal rate.  Pulmonary:     Effort: Pulmonary effort is normal.  Musculoskeletal:     Right lower leg: Edema (mild +1 non pitting edema lower extremity) present.     Left lower leg: No edema.  Skin:    General: Skin is warm and dry.     Findings: Lesion (Right lower extremity anterior leg / ankle with 1-2 cm scab with healing tissue no open ulceration.   Left lateral ankle blister with clear appearing fluid no erythema surrounding) present. No erythema or  rash.  Neurological:     Mental Status: She is alert and oriented to person, place, and time.  Psychiatric:        Behavior: Behavior normal.     Comments: Well groomed, good eye contact, normal speech and thoughts      Results for orders placed or performed in visit on 04/15/20  Comprehensive metabolic panel  Result Value Ref Range   Sodium 137 135 - 145 mmol/L   Potassium 4.1 3.5 - 5.1 mmol/L   Chloride 103 98 - 111 mmol/L   CO2 25 22 - 32 mmol/L   Glucose, Bld 90 70 - 99 mg/dL   BUN 20 8 - 23 mg/dL   Creatinine, Ser 1.00 0.44 - 1.00 mg/dL   Calcium 9.4 8.9 - 10.3 mg/dL   Total Protein 7.4 6.5 - 8.1 g/dL   Albumin 4.1 3.5 - 5.0 g/dL   AST 29 15 - 41 U/L   ALT 27 0 - 44 U/L   Alkaline Phosphatase 68 38 - 126 U/L   Total Bilirubin 0.7 0.3 - 1.2 mg/dL   GFR calc non Af Amer 54 (L) >60 mL/min   GFR calc Af Amer >60 >60 mL/min   Anion gap 9 5 - 15  CBC with Differential/Platelet   Result Value Ref Range   WBC 6.1 4.0 - 10.5 K/uL   RBC 3.69 (L) 3.87 - 5.11 MIL/uL   Hemoglobin 12.4 12.0 - 15.0 g/dL   HCT 36.6 36.0 - 46.0 %   MCV 99.2 80.0 - 100.0 fL   MCH 33.6 26.0 - 34.0 pg   MCHC 33.9 30.0 - 36.0 g/dL   RDW 14.9 11.5 - 15.5 %   Platelets 156 150 - 400 K/uL   nRBC 0.0 0.0 - 0.2 %   Neutrophils Relative % 50 %   Neutro Abs 3.1 1.7 - 7.7 K/uL   Lymphocytes Relative 28 %   Lymphs Abs 1.7 0.7 - 4.0 K/uL   Monocytes Relative 15 %   Monocytes Absolute 0.9 0.1 - 1.0 K/uL   Eosinophils Relative 5 %   Eosinophils Absolute 0.3 0.0 - 0.5 K/uL   Basophils Relative 1 %   Basophils Absolute 0.1 0.0 - 0.1 K/uL   WBC Morphology DIFF CONFIRMED BY MANUAL    Smear Review MORPHOLOGY UNREMARKABLE    Immature Granulocytes 1 %   Abs Immature Granulocytes 0.03 0.00 - 0.07 K/uL  Kappa/lambda light chains  Result Value Ref Range   Kappa free light chain 28.5 (H) 3.3 - 19.4 mg/L   Lamda free light chains 27.2 (H) 5.7 - 26.3 mg/L   Kappa, lamda light chain ratio 1.05 0.26 - 1.65      Assessment & Plan:   Problem List Items Addressed This Visit    None    Visit Diagnoses    Chronic venous insufficiency    -  Primary   Relevant Medications   furosemide (LASIX) 20 MG tablet   Non-healing ulcer of lower leg, right, limited to breakdown of skin (HCC)       Relevant Medications   mupirocin ointment (BACTROBAN) 2 %   Blister of left ankle, initial encounter       Relevant Medications   mupirocin ointment (BACTROBAN) 2 %     RLE Chronic 2 month non healing superficial ulceration - no sign secondary infection but has some scab and localized erythema only  Clinically with bilateral R>L LE edema chronic with venous insufficiency Some improve on RICE therapy. She has  prior fracture history on RLE that has limited her swelling resolution  Start topical Mupirocin BID 2 weeks - cannot use silvadene due to sulfa allergy Has L ankle blister seems uncomplicated likely insect or ant  bite, mild clear fluid blister, may use topical antibiotic as well if need Inc zinc with MVI if need for wound healing  Trial Furosemide 20mg  half or whole PRN if needed for edema. Inc potassium in diet RICE therapy  Follow-up if not improving may refer to [place].   Meds ordered this encounter  Medications  . furosemide (LASIX) 20 MG tablet    Sig: Take 1 tablet (20 mg total) by mouth daily as needed for fluid or edema. Short term use only 3-5 days as needed at a time.    Dispense:  30 tablet    Refill:  2  . mupirocin ointment (BACTROBAN) 2 %    Sig: Apply 1 application topically 2 (two) times daily. For up to 2 weeks.    Dispense:  30 g    Refill:  1     Follow up plan: Return in about 2 weeks (around 05/03/2020), or if symptoms worsen or fail to improve, for leg ulcer / swelling.  Nobie Putnam, [facility] 04/19/2020, 2:51 PM

## 2020-04-19 NOTE — Patient Instructions (Addendum)
Thank you for coming to the office today.  Try Furosemide 20mg  half or whole ONCE a day as NEEDED ONLY for swelling, caution potassium, can increase in diet  Use RICE therapy: - R - Rest / relative rest with activity modification avoid overuse of joint - I - Ice packs (make sure you use a towel or sock / something to protect skin) - C - Compression  - E - Elevation - if significant swelling, lift leg above heart level (toes above your nose) to help reduce swelling, most helpful at night after day of being on your feet   Can take OTC vitamin with Zinc to heal wound  For R leg ulcer can use topical antibiotic twice a day for 2 weeks  For L leg blister if open can use daily ointment as needed  If not improved by 2 full weeks, can message and we can order an [place] consultation   Please schedule a Follow-up Appointment to: Return in about 2 weeks (around 05/03/2020), or if symptoms worsen or fail to improve, for leg ulcer / swelling.  If you have any other questions or concerns, please feel free to call the office or send a message through [place]. You may also schedule an earlier appointment if necessary.  Additionally, you may be receiving a survey about your experience at our office within a few days to 1 week by e-mail or mail. We value your feedback.  Nobie Putnam, DO [place]

## 2020-04-25 ENCOUNTER — Other Ambulatory Visit: Payer: Self-pay | Admitting: Nurse Practitioner

## 2020-04-25 DIAGNOSIS — J309 Allergic rhinitis, unspecified: Secondary | ICD-10-CM

## 2020-04-25 NOTE — Telephone Encounter (Signed)
Approved per protocol.  

## 2020-04-29 ENCOUNTER — Inpatient Hospital Stay: Payer: Medicare Other | Admitting: Oncology

## 2020-04-29 ENCOUNTER — Other Ambulatory Visit: Payer: Self-pay

## 2020-04-29 ENCOUNTER — Encounter: Payer: Self-pay | Admitting: Oncology

## 2020-04-29 VITALS — BP 156/81 | HR 77 | Temp 98.1°F | Resp 18 | Wt 124.3 lb

## 2020-04-29 DIAGNOSIS — R911 Solitary pulmonary nodule: Secondary | ICD-10-CM | POA: Diagnosis not present

## 2020-04-29 DIAGNOSIS — D472 Monoclonal gammopathy: Secondary | ICD-10-CM

## 2020-04-29 DIAGNOSIS — Z87891 Personal history of nicotine dependence: Secondary | ICD-10-CM | POA: Diagnosis not present

## 2020-04-29 DIAGNOSIS — I7300 Raynaud's syndrome without gangrene: Secondary | ICD-10-CM | POA: Diagnosis not present

## 2020-04-29 DIAGNOSIS — M069 Rheumatoid arthritis, unspecified: Secondary | ICD-10-CM | POA: Diagnosis not present

## 2020-04-29 NOTE — Progress Notes (Signed)
Hematology/Oncology follow up note [facility] Telephone:([phone]) [phone] [phone]) [phone]   Patient Care Team: Olin Hauser, DO as PCP - General (Family Medicine)  REFERRING PROVIDER: Nobie Putnam *  CHIEF COMPLAINTS/REASON FOR VISIT:  Follow-up for IgG lambda MGUS  HISTORY OF PRESENTING ILLNESS:   Alexis Owens is a  79 y.o.  female with PMH listed below was seen in consultation at the request of  Nobie Putnam *  for evaluation of monoclonal gammopathy  Patient sees rheumatology Dr. Meda Coffee for rheumatoid arthritis.  Patient has positive CCP, arthritis was diagnosed in 2016.  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  Her work-up showed abnormal serum protein electrophoresis, IgG monoclonal lambda light chain.  Patient was referred to cancer center for further evaluation. Chronic joint pain. She complains feeling nauseated for the past 1 months.  Some diarrhea as well.  INTERVAL HISTORY Alexis Owens is a 79 y.o. female who has above history reviewed by me today presents for follow up visit for management of MGUS. Problems and complaints are listed below:  She has no new complaints.   Review of Systems  Constitutional: Negative for appetite change, chills, fatigue and fever.  HENT:   Negative for hearing loss and voice change.   Eyes: Negative for eye problems.  Respiratory: Negative for chest tightness and cough.   Cardiovascular: Negative for chest pain.  Gastrointestinal: Negative for abdominal distention, abdominal pain and blood in stool.  Endocrine: Negative for hot flashes.  Genitourinary: Negative for difficulty urinating and frequency.   Musculoskeletal: Positive for arthralgias.  Skin: Negative for itching and rash.  Neurological: Negative for extremity weakness.  Hematological: Negative for adenopathy.  Psychiatric/Behavioral: Negative for confusion.    MEDICAL HISTORY:  Past Medical History:  Diagnosis Date   . Allergy   . Arthritis   . Bunion   . Chronic sinusitis   . High risk medication use   . Hyperlipidemia   . Hypertension   . Hypothyroidism   . Insomnia   . Macular degeneration   . Osteoporosis   . Paroxysmal A-fib ([place])   . Prediabetes   . Rheumatoid arthritis ([place])     SURGICAL HISTORY: Past Surgical History:  Procedure Laterality Date  . APPENDECTOMY    . 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 LEG SURGERY     Rt leg surgery for Compound fracture of the Right tibia     SOCIAL HISTORY: Social History   Socioeconomic History  . Marital status: Married    Spouse name: Not on file  . Number of children: Not on file  . Years of education: Not on file  . Highest education level: Not on file  Occupational History  . Not on file  Tobacco Use  . Smoking status: Former Smoker    Quit date: 07/19/2014    Years since quitting: 5.7  . Smokeless tobacco: Never Used  Vaping Use  . Vaping Use: Never used  Substance and Sexual Activity  . Alcohol use: Yes  . Drug use: No  . Sexual activity: Not on file  Other Topics Concern  . Not on file  Social History Narrative  . Not on file   Social Determinants of Health   Financial Resource Strain:   . Difficulty of Paying Living Expenses:   Food Insecurity:   . Worried About Charity fundraiser in the Last  Year:   . Ran Out of Food in the Last Year:   Transportation Needs:   . Film/video editor (Medical):   Marland Kitchen Lack of Transportation (Non-Medical):   Physical Activity:   . Days of Exercise per Week:   . Minutes of Exercise per Session:   Stress:   . Feeling of Stress :   Social Connections:   . Frequency of Communication with Friends and Family:   . Frequency of Social Gatherings with Friends and Family:   . Attends Religious Services:   . Active Member of Clubs or  Organizations:   . Attends Archivist Meetings:   Marland Kitchen Marital Status:   Intimate Partner Violence:   . Fear of Current or Ex-Partner:   . Emotionally Abused:   Marland Kitchen Physically Abused:   . Sexually Abused:     FAMILY HISTORY: Family History  Problem Relation Age of Onset  . Breast cancer Sister   . Heart disease Brother   . Diabetes Brother     ALLERGIES:  is allergic to penicillin g, penicillins, sulfa antibiotics, and sulfasalazine.  MEDICATIONS:  Current Outpatient Medications  Medication Sig Dispense Refill  . albuterol (PROAIR HFA) 108 (90 Base) MCG/ACT inhaler INHALE 1 TO 2 PUFFS INTO THE LUNGS EVERY 6 HOURS AS NEEDED FOR WHEEZING OR SHORTNESS OF BREATH 8.5 g 2  . alendronate (FOSAMAX) 70 MG tablet TAKE 1 TABLET BY MOUTH ONCE A WEEK, WITH A FULL GLASS OF WATER ON AN EMPTY STOMACH, REMAIN UPRIGHT FOR 30 MINUTES 12 tablet 0  . diltiazem (DILACOR XR) 180 MG 24 hr capsule Take 1 capsule (180 mg total) by mouth daily. 90 capsule 1  . fluticasone (FLONASE) 50 MCG/ACT nasal spray SHAKE LIQUID AND USE 2 SPRAYS IN EACH NOSTRIL DAILY 16 g 3  . folic acid (FOLVITE) 1 MG tablet Take 1 mg by mouth daily.    . furosemide (LASIX) 20 MG tablet Take 1 tablet (20 mg total) by mouth daily as needed for fluid or edema. Short term use only 3-5 days as needed at a time. 30 tablet 2  . gabapentin (NEURONTIN) 100 MG capsule TAKE 1 CAPSULE BY MOUTH TWICE DAILY AS NEEDED THEN TAKE 3 CAPSULES BY MOUTH AT BEDTIME 450 capsule 0  . hydroxychloroquine (PLAQUENIL) 200 MG tablet Take 300 mg daily (1 tab and a half ) daily, 90 days    . levothyroxine (SYNTHROID) 25 MCG tablet TAKE 1 TABLET(25 MCG) BY MOUTH DAILY BEFORE BREAKFAST 90 tablet 1  . lisinopril (ZESTRIL) 20 MG tablet TAKE 1 TABLET(20 MG) BY MOUTH DAILY 90 tablet 1  . methotrexate 50 MG/2ML injection Take 0.8 ml once a week , 12 weeks    . mupirocin ointment (BACTROBAN) 2 % Apply 1 application topically 2 (two) times daily. For up to 2 weeks. 30 g  1  . omeprazole (PRILOSEC) 20 MG capsule TAKE 1 CAPSULE(20 MG) BY MOUTH DAILY 90 capsule 3  . promethazine (PHENERGAN) 12.5 MG tablet TAKE 1 TO 2 TABLETS(12.5 TO 25 MG) BY MOUTH EVERY 8 HOURS AS NEEDED FOR NAUSEA OR VOMITING 30 tablet 2  . traZODone (DESYREL) 50 MG tablet TAKE 1 TABLET(50 MG) BY MOUTH AT BEDTIME 90 tablet 1   No current facility-administered medications for this visit.     PHYSICAL EXAMINATION: ECOG PERFORMANCE STATUS: 1 - Symptomatic but completely ambulatory Vitals:   04/29/20 1016  BP: (!) 156/81  Pulse: 77  Resp: 18  Temp: 98.1 F (36.7 C)   Filed Weights  04/29/20 1016  Weight: 124 lb 4.8 oz (56.4 kg)    Physical Exam Constitutional:      General: She is not in acute distress. HENT:     Head: Normocephalic and atraumatic.  Eyes:     General: No scleral icterus. Cardiovascular:     Rate and Rhythm: Normal rate and regular rhythm.     Heart sounds: Normal heart sounds.  Pulmonary:     Effort: Pulmonary effort is normal. No respiratory distress.     Breath sounds: No wheezing.  Abdominal:     General: Bowel sounds are normal. There is no distension.     Palpations: Abdomen is soft.  Musculoskeletal:        General: No deformity. Normal range of motion.     Cervical back: Normal range of motion and neck supple.  Skin:    General: Skin is warm and dry.     Findings: No erythema or rash.  Neurological:     Mental Status: She is alert and oriented to person, place, and time. Mental status is at baseline.     Cranial Nerves: No cranial nerve deficit.     Coordination: Coordination normal.  Psychiatric:        Mood and Affect: Mood normal.     LABORATORY DATA:  I have reviewed the data as listed Lab Results  Component Value Date   WBC 6.1 04/15/2020   HGB 12.4 04/15/2020   HCT 36.6 04/15/2020   MCV 99.2 04/15/2020   PLT 156 04/15/2020   Recent Labs    12/18/19 0905 01/14/20 1200 04/15/20 1051  NA 140 137 137  K 4.5 3.8 4.1  CL 106  105 103  CO2 25 21* 25  GLUCOSE 101* 103* 90  BUN 20 19 20   CREATININE 0.97* 0.89 1.00  CALCIUM 9.7 9.8 9.4  GFRNONAA 56* >60 54*  GFRAA 65 >60 >60  PROT 6.8 7.8 7.4  ALBUMIN  --  4.5 4.1  AST 16 24 29   ALT 11 24 27   ALKPHOS  --  63 68  BILITOT 0.3 0.6 0.7   Iron/TIBC/Ferritin/ %Sat No results found for: IRON, TIBC, FERRITIN, IRONPCTSAT    RADIOGRAPHIC STUDIES: I have personally reviewed the radiological images as listed and agreed with the findings in the report.  No results found.    ASSESSMENT & PLAN:  1. Personal history of tobacco use, presenting hazards to health   2. MGUS (monoclonal gammopathy of unknown significance)   3. Lung nodule    IgG lambda MGUS Labs are reviewed and discussed with patient. Stable M protein, normal light chain ratio. NT proBNP is normal. I recommend patient to continue lab surveillance every 6 months.   #History of extensive smoking history.  Patient has been referred to lung cancer screening program.  She had lung cancer screening CT scan done on 01/29/2020.  She will need to have a repeat low-dose CT chest done in 3 months, which is due now..  13.9 mm nodule in the left upper lobe.  I will communicate with lung cancer screening program nurse petitioner.    Orders Placed This Encounter  Procedures  . CBC with Differential/Platelet    Standing Status:   Future    Standing Expiration Date:   04/29/2021  . Comprehensive metabolic panel    Standing Status:   Future    Standing Expiration Date:   04/29/2021  . Multiple Myeloma Panel (SPEP&IFE w/QIG)    Standing Status:   Future  Standing Expiration Date:   04/29/2021  . Kappa/lambda light chains    Standing Status:   Future    Standing Expiration Date:   04/29/2021    All questions were answered. The patient knows to call the clinic with any problems questions or concerns.  cc Nobie Putnam *    Return of visit: 6 months Thank you for this kind referral and the  opportunity to participate in the care of this patient. A copy of today's note is routed to referring provider     Earlie Server, MD, PhD Hematology Oncology [facility] Pager- [phone] 04/29/2020

## 2020-04-29 NOTE — Progress Notes (Signed)
Pt here for follow up. No new concerns voiced.   

## 2020-05-02 ENCOUNTER — Other Ambulatory Visit: Payer: Self-pay | Admitting: Nurse Practitioner

## 2020-05-02 DIAGNOSIS — E039 Hypothyroidism, unspecified: Secondary | ICD-10-CM

## 2020-05-02 DIAGNOSIS — I10 Essential (primary) hypertension: Secondary | ICD-10-CM

## 2020-05-11 ENCOUNTER — Encounter: Payer: Self-pay | Admitting: Emergency Medicine

## 2020-05-11 ENCOUNTER — Other Ambulatory Visit: Payer: Self-pay

## 2020-05-11 ENCOUNTER — Ambulatory Visit
Admission: EM | Admit: 2020-05-11 | Discharge: 2020-05-11 | Disposition: A | Discharge status: Home / Self Care | Payer: Medicare Other | Attending: Physician Assistant | Admitting: Physician Assistant

## 2020-05-11 DIAGNOSIS — R197 Diarrhea, unspecified: Secondary | ICD-10-CM | POA: Insufficient documentation

## 2020-05-11 DIAGNOSIS — R112 Nausea with vomiting, unspecified: Secondary | ICD-10-CM

## 2020-05-11 DIAGNOSIS — N390 Urinary tract infection, site not specified: Secondary | ICD-10-CM | POA: Insufficient documentation

## 2020-05-11 LAB — LIPASE, BLOOD: Lipase: 34 U/L (ref 11–51)

## 2020-05-11 LAB — URINALYSIS, COMPLETE (UACMP) WITH MICROSCOPIC
Bilirubin Urine: NEGATIVE
Glucose, UA: NEGATIVE mg/dL
Ketones, ur: NEGATIVE mg/dL
Nitrite: POSITIVE — AB
Protein, ur: 30 mg/dL — AB
Specific Gravity, Urine: 1.015 (ref 1.005–1.030)
WBC, UA: >50 WBC/hpf (ref 0–5)
pH: 5.5 (ref 5.0–8.0)

## 2020-05-11 LAB — MAGNESIUM: Magnesium: 2 mg/dL (ref 1.7–2.4)

## 2020-05-11 LAB — CBC WITH DIFFERENTIAL/PLATELET
Abs Immature Granulocytes: 0.03 10*3/uL (ref 0.00–0.07)
Basophils Absolute: 0.1 10*3/uL (ref 0.0–0.1)
Basophils Relative: 1 %
Eosinophils Absolute: 0.1 10*3/uL (ref 0.0–0.5)
Eosinophils Relative: 2 %
HCT: 36.1 % (ref 36.0–46.0)
Hemoglobin: 12.5 g/dL (ref 12.0–15.0)
Immature Granulocytes: 0 %
Lymphocytes Relative: 21 %
Lymphs Abs: 1.5 10*3/uL (ref 0.7–4.0)
MCH: 33.9 pg (ref 26.0–34.0)
MCHC: 34.6 g/dL (ref 30.0–36.0)
MCV: 97.8 fL (ref 80.0–100.0)
Monocytes Absolute: 0.6 10*3/uL (ref 0.1–1.0)
Monocytes Relative: 9 %
Neutro Abs: 4.7 10*3/uL (ref 1.7–7.7)
Neutrophils Relative %: 67 %
Platelets: 170 10*3/uL (ref 150–400)
RBC: 3.69 MIL/uL — ABNORMAL LOW (ref 3.87–5.11)
RDW: 14.5 % (ref 11.5–15.5)
WBC: 7.1 10*3/uL (ref 4.0–10.5)
nRBC: 0 % (ref 0.0–0.2)

## 2020-05-11 LAB — COMPREHENSIVE METABOLIC PANEL
ALT: 23 U/L (ref 0–44)
AST: 24 U/L (ref 15–41)
Albumin: 4.1 g/dL (ref 3.5–5.0)
Alkaline Phosphatase: 72 U/L (ref 38–126)
Anion gap: 9 (ref 5–15)
BUN: 24 mg/dL — ABNORMAL HIGH (ref 8–23)
CO2: 24 mmol/L (ref 22–32)
Calcium: 9.3 mg/dL (ref 8.9–10.3)
Chloride: 104 mmol/L (ref 98–111)
Creatinine, Ser: 1.12 mg/dL — ABNORMAL HIGH (ref 0.44–1.00)
GFR calc Af Amer: 54 mL/min — ABNORMAL LOW (ref >60)
GFR calc non Af Amer: 47 mL/min — ABNORMAL LOW (ref >60)
Glucose, Bld: 113 mg/dL — ABNORMAL HIGH (ref 70–99)
Potassium: 4.3 mmol/L (ref 3.5–5.1)
Sodium: 137 mmol/L (ref 135–145)
Total Bilirubin: 0.8 mg/dL (ref 0.3–1.2)
Total Protein: 7.3 g/dL (ref 6.5–8.1)

## 2020-05-11 LAB — PHOSPHORUS: Phosphorus: 3.4 mg/dL (ref 2.5–4.6)

## 2020-05-11 MED ORDER — CEFDINIR 300 MG PO CAPS: 300.0000 mg | ORAL_CAPSULE | Freq: Two times a day (BID) | ORAL | 0 refills | Status: AC

## 2020-05-11 NOTE — Discharge Instructions (Addendum)
-  Omnicef: 1 tablet twice a day for 7 days -Push fluids -Obtain stool sample with a kit provided and return in the next day or so.  We will use this to run full testing for GI infection -Would follow-up with primary care provider for further work-up

## 2020-05-11 NOTE — ED Provider Notes (Signed)
[facility]    CSN: [id number] Arrival date & time: 05/11/20  2440      History   Chief Complaint Chief Complaint  Patient presents with  . Nausea  . Diarrhea  . Emesis    HPI Ivanna Kocak is a 79 y.o. female.   Patient is a 79 year old female who presents with chief complaint of abdominal pain, watery/bloody diarrhea, vomiting, and passing of blood clots.  Patient states yesterday morning she had single episode of watery diarrhea that had both aspects of bright red blood as well as some clots.  She states she had clots yesterday with that episode and then this morning had 3 small clots that she noticed on the toilet paper after wiping.  She states she did not pass any stool this morning when she wiped and felt the 3 clots.  She reports that the clots yesterday were bigger and mixed with some bright red blood but no bright red blood this morning.  Patient states at the time yesterday morning she felt cold, clammy, and dizzy.  Patient reports she was able to eat a baked potato yesterday as part of a bland diet.  She states she feels better today but just wanted to be checked out due to the blood in the continue abdominal pain and nausea.  She reports that she does take 2 Advil twice a day but takes a daily omeprazole with the first dose of Advil.  Again she continues to have some nausea but states she has Phenergan at home but has not taken it today.  She reports that she has not had a colonoscopy in about 30 years and does not want to have one.  She also denies any history of diverticulitis.  Patient denies any new or unusual foods before yesterday morning episode, stating she watches her diet very closely.     Past Medical History:  Diagnosis Date  . Allergy   . Arthritis   . Bunion   . Chronic sinusitis   . High risk medication use   . Hyperlipidemia   . Hypertension   . Hypothyroidism   . Insomnia   . Macular degeneration   . Osteoporosis   . Paroxysmal A-fib  ([place])   . Prediabetes   . Rheumatoid arthritis [facility])     Patient Active Problem List   Diagnosis Date Noted  . Chronic venous insufficiency 04/19/2020  . Centrilobular emphysema ([place]) 12/18/2019  . S/P hip hemiarthroplasty 02/07/2018  . 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 Encounter for long-term (current) use of high-risk medication 10/14/2017  . 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 Hypertension 07/24/2017  . Paroxysmal A-fib ([place]) 07/24/2017  . Hypothyroidism 07/24/2017  . Rheumatoid arthritis ([place]) 07/24/2017  . High risk medication use 07/24/2017  . Prediabetes 07/24/2017  . Insomnia 07/24/2017  . Macular degeneration 07/24/2017  . Hyperlipidemia 07/24/2017  . Allergic rhinitis 07/24/2017  . Age-related osteoporosis with current pathol fracture of left femur, sequela 07/24/2017  . Neuropathic pain 07/24/2017  . Other chronic pain 07/24/2017    Past Surgical History:  Procedure Laterality Date  . APPENDECTOMY    . 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 LEG SURGERY     Rt leg surgery for Compound fracture of the Right tibia     OB History   No obstetric  history on file.      Home Medications    Prior to Admission medications   Medication Sig Start Date End Date Taking? Authorizing Provider  albuterol (PROAIR HFA) 108 (90 Base) MCG/ACT inhaler INHALE 1 TO 2 PUFFS INTO THE LUNGS EVERY 6 HOURS AS NEEDED FOR WHEEZING OR SHORTNESS OF BREATH 10/12/19  Yes Karamalegos, Alexander J, DO  alendronate (FOSAMAX) 70 MG tablet TAKE 1 TABLET BY MOUTH ONCE A WEEK, WITH A FULL GLASS OF WATER ON AN EMPTY STOMACH, REMAIN UPRIGHT FOR 30 MINUTES 06/12/19  Yes Mikey College, NP  diltiazem (DILACOR XR) 180 MG 24 hr capsule Take 1 capsule (180 mg total) by mouth  daily. 06/12/19  Yes Mikey College, NP  fluticasone [facility]) 50 MCG/ACT nasal spray SHAKE LIQUID AND USE 2 SPRAYS IN EACH NOSTRIL DAILY 04/25/20  Yes Karamalegos, Devonne Doughty, DO  folic acid (FOLVITE) 1 MG tablet Take 1 mg by mouth daily.   Yes [provider]  furosemide (LASIX) 20 MG tablet Take 1 tablet (20 mg total) by mouth daily as needed for fluid or edema. Short term use only 3-5 days as needed at a time. 04/19/20  Yes Karamalegos, Devonne Doughty, DO  gabapentin (NEURONTIN) 100 MG capsule TAKE 1 CAPSULE BY MOUTH TWICE DAILY AS NEEDED THEN TAKE 3 CAPSULES BY MOUTH AT BEDTIME 06/12/19  Yes Mikey College, NP  hydroxychloroquine (PLAQUENIL) 200 MG tablet Take 300 mg daily (1 tab and a half ) daily, 90 days 10/14/17  Yes [provider]  ibuprofen (ADVIL) 200 MG tablet Take 400 mg by mouth 2 (two) times daily.   Yes [provider]  levothyroxine (SYNTHROID) 25 MCG tablet TAKE 1 TABLET(25 MCG) BY MOUTH DAILY BEFORE BREAKFAST 05/02/20  Yes Karamalegos, Alexander J, DO  lisinopril (ZESTRIL) 20 MG tablet TAKE 1 TABLET(20 MG) BY MOUTH DAILY 05/02/20  Yes Karamalegos, Devonne Doughty, DO  methotrexate 50 MG/2ML injection Take 0.8 ml once a week , 12 weeks 08/14/18  Yes [provider]  mupirocin ointment (BACTROBAN) 2 % Apply 1 application topically 2 (two) times daily. For up to 2 weeks. 04/19/20  Yes Karamalegos, Devonne Doughty, DO  omeprazole (PRILOSEC) 20 MG capsule TAKE 1 CAPSULE(20 MG) BY MOUTH DAILY 07/03/19  Yes Mikey College, NP  promethazine (PHENERGAN) 12.5 MG tablet TAKE 1 TO 2 TABLETS(12.5 TO 25 MG) BY MOUTH EVERY 8 HOURS AS NEEDED FOR NAUSEA OR VOMITING 04/14/20  Yes Karamalegos, Devonne Doughty, DO  traZODone (DESYREL) 50 MG tablet TAKE 1 TABLET(50 MG) BY MOUTH AT BEDTIME 11/10/19  Yes Karamalegos, Alexander J, DO  cefdinir (OMNICEF) 300 MG capsule Take 1 capsule (300 mg total) by mouth 2 (two) times daily for 7 days. 05/11/20 05/18/20  Luvenia Redden, PA-C     Family History Family History  Problem Relation Age of Onset  . Breast cancer Sister   . Heart disease Brother   . Diabetes Brother   . Osteoarthritis Mother   . Lung disease Father     Social History Social History   Tobacco Use  . Smoking status: Former Smoker    Quit date: 07/19/2014    Years since quitting: 5.8  . Smokeless tobacco: Never Used  Vaping Use  . Vaping Use: Never used  Substance Use Topics  . Alcohol use: Yes    Alcohol/week: 7.0 standard drinks    Types: 7 Glasses of wine per week  . Drug use: No     Allergies   Penicillin g, Penicillins,  Sulfa antibiotics, and Sulfasalazine   Review of Systems Review of Systems as noted above in HPI, other system reviewed and found to be negative   Physical Exam Triage Vital Signs ED Triage Vitals  Enc Vitals Group     BP 05/11/20 0905 (!) 135/93     Pulse Rate 05/11/20 0905 88     Resp 05/11/20 0905 18     Temp 05/11/20 0905 98.1 F (36.7 C)     Temp Source 05/11/20 0905 Oral     SpO2 05/11/20 0905 100 %     Weight 05/11/20 0906 123 lb (55.8 kg)     Height 05/11/20 0906 5' 2"  (1.575 m)     Head Circumference --      Peak Flow --      Pain Score 05/11/20 0904 6     Pain Loc --      Pain Edu? --      Excl. in [place]? --    No data found.  Updated Vital Signs BP (!) 135/93 (BP Location: Left Arm)   Pulse 88   Temp 98.1 F (36.7 C) (Oral)   Resp 18   Ht 5' 2"  (1.575 m)   Wt 123 lb (55.8 kg)   SpO2 100%   BMI 22.50 kg/m   Physical Exam Constitutional:      Appearance: Normal appearance. She is not ill-appearing.  HENT:     Head: Normocephalic and atraumatic.  Cardiovascular:     Rate and Rhythm: Normal rate.     Pulses: Normal pulses.     Heart sounds: Normal heart sounds.  Pulmonary:     Effort: Pulmonary effort is normal.     Breath sounds: Normal breath sounds.  Abdominal:     General: Abdomen is flat. Bowel sounds are increased.     Palpations: Abdomen is soft. There is no mass.      Tenderness: There is abdominal tenderness in the periumbilical area and suprapubic area. There is guarding. There is no rebound.  Skin:    General: Skin is warm and dry.     Capillary Refill: Capillary refill takes less than 2 seconds.  Neurological:     General: No focal deficit present.     Mental Status: She is alert and oriented to person, place, and time.      UC Treatments / Results  Labs (all labs ordered are listed, but only abnormal results are displayed) Labs Reviewed  CBC WITH DIFFERENTIAL/PLATELET - Abnormal; Notable for the following components:      Result Value   RBC 3.69 (*)    All other components within normal limits  URINALYSIS, COMPLETE (UACMP) WITH MICROSCOPIC - Abnormal; Notable for the following components:   APPearance CLOUDY (*)    Hgb urine dipstick MODERATE (*)    Protein, ur 30 (*)    Nitrite POSITIVE (*)    Leukocytes,Ua LARGE (*)    Bacteria, UA MANY (*)    All other components within normal limits  COMPREHENSIVE METABOLIC PANEL - Abnormal; Notable for the following components:   Glucose, Bld 113 (*)    BUN 24 (*)    Creatinine, Ser 1.12 (*)    GFR calc non Af Amer 47 (*)    GFR calc Af Amer 54 (*)    All other components within normal limits  MAGNESIUM  PHOSPHORUS  LIPASE, BLOOD    EKG   Radiology No results found.  Procedures Procedures (including critical care time)  Medications Ordered in UC  Medications - No data to display  Initial Impression / Assessment and Plan / UC Course  I have reviewed the triage vital signs and the nursing notes.  Pertinent labs & imaging results that were available during my care of the patient were reviewed by me and considered in my medical decision making (see chart for details).     Patient with episode of bloody diarrhea x1 yesterday with both bright red blood as well as clots passed.  No episodes since then of the diarrhea but patient does report 3 small clots on toilet paper this morning.   Patient reports continued nausea and abdominal pain.  CBC with creatinine of 1.12, minimally up from 1.1 on June 4.  Mag Foss and lipase within normal limits.  CBC without anemia or leukocytosis.  However her urinalysis shows a UTI with positive nitrite, large leukocyte esterase many bacteria, and moderate blood.  We will send urine for culture.  We will give her antibiotic for her UTI.  Also send her with stool sample collection kit to bring back for GI PCR and C. difficile to be tested after her next BM.  Given her age in possible cystitis based on exam give her prescription for [facility].  Have her push fluids and return her stool sample.  Have her follow with primary care provider.  Final Clinical Impressions(s) / UC Diagnoses   Final diagnoses:  Diarrhea, unspecified type  Lower urinary tract infectious disease     Discharge Instructions     -Omnicef: 1 tablet twice a day for 7 days -Push fluids -Obtain stool sample with a kit provided and return in the next day or so.  We will use this to run full testing for GI infection -Would follow-up with primary care provider for further work-up    ED Prescriptions    Medication Sig Dispense Auth. Provider   cefdinir (OMNICEF) 300 MG capsule Take 1 capsule (300 mg total) by mouth 2 (two) times daily for 7 days. 14 capsule Luvenia Redden, PA-C     PDMP not reviewed this encounter.   Luvenia Redden, PA-C 05/11/20 1028

## 2020-05-11 NOTE — ED Triage Notes (Addendum)
Patient in today c/o nausea, emesis and diarrhea x yesterday. Patient states she had bright red blood per rectum after the BM yesterday. Patient states she felt feverish this morning and took her temperature and it was 99. Patient has had both [place] covid vaccines completed 03/2020.

## 2020-05-12 ENCOUNTER — Other Ambulatory Visit
Admission: RE | Admit: 2020-05-12 | Discharge: 2020-05-12 | Disposition: A | Discharge status: Home / Self Care | Payer: Medicare Other | Source: Ambulatory Visit | Attending: Physician Assistant | Admitting: Physician Assistant

## 2020-05-12 DIAGNOSIS — R197 Diarrhea, unspecified: Secondary | ICD-10-CM | POA: Insufficient documentation

## 2020-05-12 DIAGNOSIS — R1110 Vomiting, unspecified: Secondary | ICD-10-CM | POA: Diagnosis not present

## 2020-05-12 LAB — GASTROINTESTINAL PANEL BY PCR, STOOL (REPLACES STOOL CULTURE)

## 2020-05-12 LAB — C DIFFICILE QUICK SCREEN W PCR REFLEX
C Diff antigen: NEGATIVE
C Diff interpretation: NOT DETECTED
C Diff toxin: NEGATIVE

## 2020-05-17 ENCOUNTER — Other Ambulatory Visit: Payer: Self-pay | Admitting: Family Medicine

## 2020-05-17 DIAGNOSIS — G4709 Other insomnia: Secondary | ICD-10-CM

## 2020-06-01 NOTE — Progress Notes (Signed)
Erroneous encounter. Disregard.

## 2020-06-23 DIAGNOSIS — M059 Rheumatoid arthritis with rheumatoid factor, unspecified: Secondary | ICD-10-CM | POA: Diagnosis not present

## 2020-06-23 DIAGNOSIS — M80052S Age-related osteoporosis with current pathological fracture, left femur, sequela: Secondary | ICD-10-CM | POA: Diagnosis not present

## 2020-06-23 DIAGNOSIS — M159 Polyosteoarthritis, unspecified: Secondary | ICD-10-CM | POA: Diagnosis not present

## 2020-06-23 DIAGNOSIS — R110 Nausea: Secondary | ICD-10-CM | POA: Diagnosis not present

## 2020-06-23 DIAGNOSIS — Z79899 Other long term (current) drug therapy: Secondary | ICD-10-CM | POA: Diagnosis not present

## 2020-06-23 DIAGNOSIS — E559 Vitamin D deficiency, unspecified: Secondary | ICD-10-CM | POA: Diagnosis not present

## 2020-07-01 DIAGNOSIS — Z780 Asymptomatic menopausal state: Secondary | ICD-10-CM | POA: Diagnosis not present

## 2020-07-09 ENCOUNTER — Telehealth: Payer: Self-pay | Admitting: *Deleted

## 2020-07-09 NOTE — Telephone Encounter (Signed)
Pt notified that her follow up lung cancer imaging is due currently or in the near future. Mrs. Alexis Owens is a former smoker. Appointment made for 08/11/2020 @ 9:30 am.

## 2020-07-18 ENCOUNTER — Other Ambulatory Visit: Payer: Self-pay | Admitting: Family Medicine

## 2020-07-18 DIAGNOSIS — J309 Allergic rhinitis, unspecified: Secondary | ICD-10-CM

## 2020-07-19 ENCOUNTER — Other Ambulatory Visit: Payer: Self-pay | Admitting: *Deleted

## 2020-07-19 DIAGNOSIS — Z87891 Personal history of nicotine dependence: Secondary | ICD-10-CM

## 2020-07-19 DIAGNOSIS — R918 Other nonspecific abnormal finding of lung field: Secondary | ICD-10-CM

## 2020-07-29 ENCOUNTER — Other Ambulatory Visit: Payer: Self-pay | Admitting: Family Medicine

## 2020-07-29 DIAGNOSIS — R059 Cough, unspecified: Secondary | ICD-10-CM

## 2020-08-05 ENCOUNTER — Encounter: Payer: Self-pay | Admitting: Intensive Care

## 2020-08-05 ENCOUNTER — Emergency Department: Payer: Medicare Other

## 2020-08-05 ENCOUNTER — Other Ambulatory Visit: Payer: Self-pay

## 2020-08-05 ENCOUNTER — Emergency Department
Admission: EM | Admit: 2020-08-05 | Discharge: 2020-08-05 | Disposition: A | Discharge status: Home / Self Care | Payer: Medicare Other | Attending: Emergency Medicine | Admitting: Emergency Medicine

## 2020-08-05 DIAGNOSIS — R7303 Prediabetes: Secondary | ICD-10-CM | POA: Diagnosis not present

## 2020-08-05 DIAGNOSIS — Z7984 Long term (current) use of oral hypoglycemic drugs: Secondary | ICD-10-CM | POA: Diagnosis not present

## 2020-08-05 DIAGNOSIS — E039 Hypothyroidism, unspecified: Secondary | ICD-10-CM | POA: Diagnosis not present

## 2020-08-05 DIAGNOSIS — J449 Chronic obstructive pulmonary disease, unspecified: Secondary | ICD-10-CM | POA: Diagnosis not present

## 2020-08-05 DIAGNOSIS — J439 Emphysema, unspecified: Secondary | ICD-10-CM | POA: Diagnosis not present

## 2020-08-05 DIAGNOSIS — Z87891 Personal history of nicotine dependence: Secondary | ICD-10-CM | POA: Insufficient documentation

## 2020-08-05 DIAGNOSIS — Z79899 Other long term (current) drug therapy: Secondary | ICD-10-CM | POA: Diagnosis not present

## 2020-08-05 DIAGNOSIS — Z96642 Presence of left artificial hip joint: Secondary | ICD-10-CM | POA: Insufficient documentation

## 2020-08-05 DIAGNOSIS — M25512 Pain in left shoulder: Secondary | ICD-10-CM | POA: Diagnosis not present

## 2020-08-05 DIAGNOSIS — S43014A Anterior dislocation of right humerus, initial encounter: Secondary | ICD-10-CM | POA: Diagnosis not present

## 2020-08-05 DIAGNOSIS — W19XXXA Unspecified fall, initial encounter: Secondary | ICD-10-CM

## 2020-08-05 DIAGNOSIS — S42252D Displaced fracture of greater tuberosity of left humerus, subsequent encounter for fracture with routine healing: Secondary | ICD-10-CM | POA: Diagnosis not present

## 2020-08-05 DIAGNOSIS — S42292A Other displaced fracture of upper end of left humerus, initial encounter for closed fracture: Secondary | ICD-10-CM | POA: Diagnosis not present

## 2020-08-05 DIAGNOSIS — Z743 Need for continuous supervision: Secondary | ICD-10-CM | POA: Diagnosis not present

## 2020-08-05 DIAGNOSIS — Z7951 Long term (current) use of inhaled steroids: Secondary | ICD-10-CM | POA: Insufficient documentation

## 2020-08-05 DIAGNOSIS — I10 Essential (primary) hypertension: Secondary | ICD-10-CM | POA: Insufficient documentation

## 2020-08-05 DIAGNOSIS — W010XXA Fall on same level from slipping, tripping and stumbling without subsequent striking against object, initial encounter: Secondary | ICD-10-CM | POA: Insufficient documentation

## 2020-08-05 DIAGNOSIS — Y92009 Unspecified place in unspecified non-institutional (private) residence as the place of occurrence of the external cause: Secondary | ICD-10-CM | POA: Insufficient documentation

## 2020-08-05 DIAGNOSIS — R911 Solitary pulmonary nodule: Secondary | ICD-10-CM | POA: Diagnosis not present

## 2020-08-05 DIAGNOSIS — M25519 Pain in unspecified shoulder: Secondary | ICD-10-CM

## 2020-08-05 DIAGNOSIS — S43015D Anterior dislocation of left humerus, subsequent encounter: Secondary | ICD-10-CM | POA: Diagnosis not present

## 2020-08-05 DIAGNOSIS — R52 Pain, unspecified: Secondary | ICD-10-CM | POA: Diagnosis not present

## 2020-08-05 DIAGNOSIS — M21822 Other specified acquired deformities of left upper arm: Secondary | ICD-10-CM

## 2020-08-05 DIAGNOSIS — R0689 Other abnormalities of breathing: Secondary | ICD-10-CM | POA: Diagnosis not present

## 2020-08-05 DIAGNOSIS — S4292XD Fracture of left shoulder girdle, part unspecified, subsequent encounter for fracture with routine healing: Secondary | ICD-10-CM | POA: Diagnosis not present

## 2020-08-05 DIAGNOSIS — R6889 Other general symptoms and signs: Secondary | ICD-10-CM | POA: Diagnosis not present

## 2020-08-05 DIAGNOSIS — M25561 Pain in right knee: Secondary | ICD-10-CM | POA: Diagnosis not present

## 2020-08-05 DIAGNOSIS — S43005D Unspecified dislocation of left shoulder joint, subsequent encounter: Secondary | ICD-10-CM | POA: Diagnosis not present

## 2020-08-05 DIAGNOSIS — S43015A Anterior dislocation of left humerus, initial encounter: Secondary | ICD-10-CM | POA: Diagnosis not present

## 2020-08-05 DIAGNOSIS — S4292XA Fracture of left shoulder girdle, part unspecified, initial encounter for closed fracture: Secondary | ICD-10-CM

## 2020-08-05 HISTORY — DX: Chronic obstructive pulmonary disease, unspecified: J44.9

## 2020-08-05 MED ORDER — FENTANYL CITRATE (PF) 100 MCG/2ML IJ SOLN
50.0000 ug | Freq: Once | INTRAMUSCULAR | Status: AC
  Administered 2020-08-05: 50 ug via INTRAVENOUS
  Filled 2020-08-05: qty 2

## 2020-08-05 MED ORDER — PROPOFOL 10 MG/ML IV BOLUS
100.0000 mg | Freq: Once | INTRAVENOUS | Status: AC
  Administered 2020-08-05: 160 mg via INTRAVENOUS
  Filled 2020-08-05: qty 20

## 2020-08-05 MED ORDER — PROPOFOL 10 MG/ML IV BOLUS
100.0000 mg | Freq: Once | INTRAVENOUS | Status: AC
  Administered 2020-08-05: 110 mg via INTRAVENOUS
  Filled 2020-08-05: qty 20

## 2020-08-05 MED ORDER — PROPOFOL 1000 MG/100ML IV EMUL: 5.0000 ug/kg/min | INTRAVENOUS | Status: DC

## 2020-08-05 MED ORDER — PROPOFOL 10 MG/ML IV BOLUS: 1.0000 mg/kg | Freq: Once | INTRAVENOUS | Status: DC

## 2020-08-05 NOTE — Sedation Documentation (Signed)
EDP Dr Tamala Julian at bedside to attempt to reduce pt's shoulder again.

## 2020-08-05 NOTE — ED Provider Notes (Signed)
[facility] Emergency Department Provider Note ____________________________________________   First MD Initiated Contact with Patient 08/05/20 1536     (approximate)  I have reviewed the triage vital signs and the nursing notes.  HISTORY  Chief Complaint Fall   HPI Alexis Owens is a 79 y.o. femalewho presents to the ED for evaluation of left shoulder pain after a fall.   Chart review indicates hx RA on Plaquenil and MTX, HTN, COPD not on home O2.  Patient reports being in her typical state of health until she was ambulating around her garden when she accidentally tripped over her dog, causing her to fall to her left side. This occurred this morning just prior to arrival. She denies head trauma or syncope, she reports falling into her left shoulder and having immediate pain. She is reporting 8/10 intensity left shoulder pain that is aching and nonradiating. Improved by EMS fentanyl, now worsening to 9/10 intensity. Denies any syncope, vomiting, additional falls.  Beyond her left shoulder, she is reporting right knee pain. 5/10 intensity. Left flank pain, 2/10 intensity.  Past Medical History:  Diagnosis Date   Allergy    Arthritis    Bunion    Chronic sinusitis    COPD (chronic obstructive pulmonary disease) (HCC)    High risk medication use    Hyperlipidemia    Hypertension    Hypothyroidism    Insomnia    Macular degeneration    Osteoporosis    Paroxysmal A-fib ([place])    Prediabetes    Rheumatoid arthritis ([place])     Patient Active Problem List   Diagnosis Date Noted   Chronic venous insufficiency 04/19/2020   Centrilobular emphysema ([place]) 12/18/2019   S/P hip hemiarthroplasty 02/07/2018   Status post total hip replacement, left 12/18/2017   Encounter for long-term (current) use of high-risk medication 10/14/2017   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/14/2017   Osteoporosis, 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    Hypertension 07/24/2017   Paroxysmal A-fib ([place]) 07/24/2017   Hypothyroidism 07/24/2017   Rheumatoid arthritis ([place]) 07/24/2017   High risk medication use 07/24/2017   Prediabetes 07/24/2017   Insomnia 07/24/2017   Macular degeneration 07/24/2017   Hyperlipidemia 07/24/2017   Allergic rhinitis 07/24/2017   Age-related osteoporosis with current pathol fracture of left femur, sequela 07/24/2017   Neuropathic pain 07/24/2017   Other chronic pain 07/24/2017    Past Surgical History:  Procedure Laterality Date   APPENDECTOMY     HIP ARTHROPLASTY Left 12/18/2017   Procedure: ARTHROPLASTY BIPOLAR HIP (HEMIARTHROPLASTY);  Surgeon: Corky Mull, MD;  Location: [facility] ORS;  Service: Orthopedics;  Laterality: Left;   LEG SURGERY     Rt leg surgery for Compound fracture of the Right tibia     Prior to Admission medications   Medication Sig Start Date End Date Taking? Authorizing Provider  albuterol (VENTOLIN HFA) 108 (90 Base) MCG/ACT inhaler INHALE 1 TO 2 PUFFS INTO THE LUNGS EVERY 6 HOURS AS NEEDED FOR WHEEZING OR SHORTNESS OF BREATH 07/29/20   Karamalegos, Devonne Doughty, DO  alendronate (FOSAMAX) 70 MG tablet TAKE 1 TABLET BY MOUTH ONCE A WEEK, WITH A FULL GLASS OF WATER ON AN EMPTY STOMACH, REMAIN UPRIGHT FOR 30 MINUTES 06/12/19   Mikey College, NP  diltiazem (DILACOR XR) 180 MG 24 hr capsule Take 1 capsule (180 mg total) by mouth daily. 06/12/19   Mikey College, NP  fluticasone (FLONASE) 50 MCG/ACT nasal spray SHAKE LIQUID AND USE 2 SPRAYS IN EACH NOSTRIL DAILY 07/19/20  Karamalegos, Devonne Doughty, DO  folic acid (FOLVITE) 1 MG tablet Take 1 mg by mouth daily.    [provider]  furosemide (LASIX) 20 MG tablet Take 1 tablet (20 mg total) by mouth daily as needed for fluid or edema. Short term use only 3-5 days as needed at a time. 04/19/20   Karamalegos, Devonne Doughty, DO  gabapentin (NEURONTIN) 100 MG capsule TAKE 1 CAPSULE BY MOUTH TWICE DAILY AS NEEDED THEN TAKE  3 CAPSULES BY MOUTH AT BEDTIME 06/12/19   Mikey College, NP  hydroxychloroquine (PLAQUENIL) 200 MG tablet Take 300 mg daily (1 tab and a half ) daily, 90 days 10/14/17   [provider]  ibuprofen (ADVIL) 200 MG tablet Take 400 mg by mouth 2 (two) times daily.    [provider]  levothyroxine (SYNTHROID) 25 MCG tablet TAKE 1 TABLET(25 MCG) BY MOUTH DAILY BEFORE BREAKFAST 05/02/20   Karamalegos, Devonne Doughty, DO  lisinopril (ZESTRIL) 20 MG tablet TAKE 1 TABLET(20 MG) BY MOUTH DAILY 05/02/20   Parks Ranger, Devonne Doughty, DO  methotrexate 50 MG/2ML injection Take 0.8 ml once a week , 12 weeks 08/14/18   [provider]  mupirocin ointment (BACTROBAN) 2 % Apply 1 application topically 2 (two) times daily. For up to 2 weeks. 04/19/20   Karamalegos, Devonne Doughty, DO  omeprazole (PRILOSEC) 20 MG capsule TAKE 1 CAPSULE(20 MG) BY MOUTH DAILY 07/03/19   Mikey College, NP  promethazine (PHENERGAN) 12.5 MG tablet TAKE 1 TO 2 TABLETS(12.5 TO 25 MG) BY MOUTH EVERY 8 HOURS AS NEEDED FOR NAUSEA OR VOMITING 04/14/20   Parks Ranger, Devonne Doughty, DO  traZODone (DESYREL) 50 MG tablet TAKE 1 TABLET(50 MG) BY MOUTH AT BEDTIME 05/17/20   Karamalegos, Devonne Doughty, DO    Allergies Penicillin g, Penicillins, Sulfa antibiotics, and Sulfasalazine  Family History  Problem Relation Age of Onset   Breast cancer Sister    Heart disease Brother    Diabetes Brother    Osteoarthritis Mother    Lung disease Father     Social History Social History   Tobacco Use   Smoking status: Former Smoker    Quit date: 07/19/2014    Years since quitting: 6.0   Smokeless tobacco: Never Used  Vaping Use   Vaping Use: Never used  Substance Use Topics   Alcohol use: Yes    Alcohol/week: 14.0 standard drinks    Types: 14 Glasses of wine per week   Drug use: No    Review of Systems  Constitutional: No fever/chills Eyes: No visual changes. ENT: No sore throat. Cardiovascular: Denies chest  pain. Respiratory: Denies shortness of breath. Gastrointestinal: No abdominal pain.  No nausea, no vomiting.  No diarrhea.  No constipation. Genitourinary: Negative for dysuria. Musculoskeletal: Positive left shoulder, left flank and right knee pain. Positive for fall. Skin: Negative for rash. Neurological: Negative for headaches, focal weakness or numbness.   ____________________________________________   PHYSICAL EXAM:  VITAL SIGNS: Vitals:   08/05/20 1930 08/05/20 1945  BP: 134/69 132/69  Pulse: 82 80  Resp: (!) 21 14  Temp:    SpO2: 97% 100%      Constitutional: Alert and oriented. Well appearing and in no acute distress. Pleasant and conversational full sentences without distress. Eyes: Conjunctivae are normal. PERRL. EOMI. Head: Atraumatic. Nose: No congestion/rhinnorhea. Mouth/Throat: Mucous membranes are moist.  Oropharynx non-erythematous. Neck: No stridor. No cervical spine tenderness to palpation. Cardiovascular: Normal rate, regular rhythm. Grossly normal heart sounds.  Good peripheral circulation. Respiratory: Normal  respiratory effort.  No retractions. Lungs CTAB. Gastrointestinal: Soft , nondistended, nontender to palpation. No abdominal bruits. No CVA tenderness. Musculoskeletal: Cradling her left arm. LUE is distally neurovascularly intact. Tenderness over left shoulder diffusely without evidence of open wound or discrete laceration. Remainder of LUE is atraumatic. RUE and LLE shows no evidence of trauma. Right knee, where she has pain, shows no discrete signs of trauma. Vague tenderness over her lateral joint line. Range of motion is impaired due to her pain. Distally neurovascularly intact. Neurologic:  Normal speech and language. No gross focal neurologic deficits are appreciated. No gait instability noted. Skin:  Skin is warm, dry and intact. No rash noted. Psychiatric: Mood and affect are normal. Speech and behavior are  normal.  ____________________________________________   LABS (all labs ordered are listed, but only abnormal results are displayed)  Labs Reviewed - No data to display ____________________________________________  12 Lead EKG   ____________________________________________  RADIOLOGY  ED MD interpretation: Left shoulder films reviewed with evidence of anterior left shoulder dislocation with associated Hill-Sachs deformity.  Official radiology report(s): DG Chest 1 View  Result Date: 08/05/2020 CLINICAL DATA:  Tripped and fell, right knee and left shoulder pain EXAM: CHEST  1 VIEW COMPARISON:  04/24/2019 FINDINGS: Two frontal views of the chest demonstrate a fracture dislocation of the left glenohumeral joint, with anterior dislocation and comminuted displaced Hill-Sachs fracture. Cardiac silhouette is unremarkable. Background emphysema unchanged. 1.8 cm left upper lobe pulmonary nodule unchanged since recent CT. No airspace disease, effusion, or pneumothorax. IMPRESSION: 1. Anterior left shoulder dislocation with comminuted Hill-Sachs deformity. 2. 1.8 cm left upper lobe pulmonary nodule. No change since previous low-dose lung cancer screening CT. Please see report from 01/29/2020 for follow-up recommendations. 3. Emphysema.  No acute process. Electronically Signed   By: Randa Ngo M.D.   On: 08/05/2020 16:57   DG Shoulder 1V Left  Result Date: 08/05/2020 CLINICAL DATA:  Reduction of anterior dislocation EXAM: LEFT SHOULDER COMPARISON:  08/05/2020 FINDINGS: Single frontal view of the left shoulder demonstrates reduction of the glenohumeral dislocation seen previously, now with anatomic alignment. Fracture fragments along the superolateral aspect of the humeral head are in near anatomic alignment. Left chest is clear. IMPRESSION: 1. Reduction of the fracture dislocation of the left glenohumeral joint seen previously. Electronically Signed   By: Randa Ngo M.D.   On: 08/05/2020 19:33    DG Shoulder 1V Left  Result Date: 08/05/2020 CLINICAL DATA:  Attempted left shoulder dislocation reduction. EXAM: LEFT SHOULDER COMPARISON:  Earlier films, same date. FINDINGS: Persistent anterior subcoracoid dislocation with associated fracture fragments from the greater tuberosity. The humeral head is likely stuck/impacted on the glenoid. IMPRESSION: Persistent anterior subcoracoid dislocation with associated fracture fragments from the greater tuberosity. Electronically Signed   By: Marijo Sanes M.D.   On: 08/05/2020 18:37   DG Shoulder Left  Result Date: 08/05/2020 CLINICAL DATA:  Left shoulder pain after fall EXAM: LEFT SHOULDER - 2+ VIEW COMPARISON:  CT chest 01/29/2020 FINDINGS: Anterior dislocation of the left shoulder joint. Large Hill-Sachs impaction fracture with displaced comminuted fracture fragment measuring approximately 2.0 x 0.8 cm. No definite glenoid fracture. Rounded mass is again seen within the inferior aspect of the left upper lobe measuring approximately 1.5 cm, similar to previous CT 01/29/2020. IMPRESSION: 1. Anterior dislocation of the left shoulder joint with large Hill-Sachs impaction fracture with comminuted displaced fracture fragments. 2. Rounded 1.5 cm mass within the inferior left upper lobe, similar to previous CT 01/29/2020. See follow-up recommendations from CT  report. Electronically Signed   By: Davina Poke D.O.   On: 08/05/2020 14:48   DG Knee Complete 4 Views Right  Result Date: 08/05/2020 CLINICAL DATA:  Tripped over dog earlier leading to fall. Right knee pain. History of RA. EXAM: RIGHT KNEE - COMPLETE 4+ VIEW COMPARISON:  None. FINDINGS: No evidence of fracture, dislocation, or joint effusion. Mild medial tibiofemoral joint space narrowing. No osteophytes or erosions. Bones appear under mineralized. Soft tissues are unremarkable. IMPRESSION: No acute fracture or subluxation. Mild medial tibiofemoral joint space narrowing, typically degenerative.  Electronically Signed   By: Keith Rake M.D.   On: 08/05/2020 16:54    ____________________________________________   PROCEDURES and INTERVENTIONS  Procedure(s) performed (including Critical Care):  .1-3 Lead EKG Interpretation Performed by: Vladimir Crofts, MD Authorized by: Vladimir Crofts, MD     Interpretation: normal     ECG rate:  80   ECG rate assessment: normal     Rhythm: sinus rhythm     Ectopy: none     Conduction: normal   .Sedation  Date/Time: 08/05/2020 6:21 PM Performed by: Vladimir Crofts, MD Authorized by: Vladimir Crofts, MD   Consent:    Consent obtained:  Verbal and written   Consent given by:  Patient   Risks discussed:  Allergic reaction, dysrhythmia, inadequate sedation, nausea, prolonged hypoxia resulting in organ damage, prolonged sedation necessitating reversal, respiratory compromise necessitating ventilatory assistance and intubation and vomiting   Alternatives discussed:  Analgesia without sedation, anxiolysis and regional anesthesia Universal protocol:    Procedure explained and questions answered to patient or proxy's satisfaction: yes     Relevant documents present and verified: yes     Test results available and properly labeled: yes     Imaging studies available: yes     Required blood products, implants, devices, and special equipment available: yes     Site/side marked: yes     Immediately prior to procedure a time out was called: yes     Patient identity confirmation method:  Verbally with patient Indications:    Procedure necessitating sedation performed by:  Physician performing sedation Pre-sedation assessment:    Time since last food or drink:  12p   ASA classification: class 2 - patient with mild systemic disease     Neck mobility: normal     Mouth opening:  3 or more finger widths   Thyromental distance:  4 finger widths   Mallampati score:  I - soft palate, uvula, fauces, pillars visible   Pre-sedation assessments completed and  reviewed: airway patency, cardiovascular function, hydration status, mental status, nausea/vomiting, pain level, respiratory function and temperature   Immediate pre-procedure details:    Reassessment: Patient reassessed immediately prior to procedure     Reviewed: vital signs, relevant labs/tests and NPO status     Verified: bag valve mask available, emergency equipment available, intubation equipment available, IV patency confirmed, oxygen available and suction available   Procedure details (see MAR for exact dosages):    Preoxygenation:  Nasal cannula   Sedation:  Propofol   Intended level of sedation: deep   Analgesia:  Fentanyl   Intra-procedure monitoring:  Blood pressure monitoring, cardiac monitor, continuous pulse oximetry, frequent LOC assessments, frequent vital sign checks and continuous capnometry   Intra-procedure events: none     Total Provider sedation time (minutes):  10 Post-procedure details:    Attendance: Constant attendance by certified staff until patient recovered     Recovery: Patient returned to pre-procedure baseline  Post-sedation assessments completed and reviewed: airway patency, cardiovascular function, hydration status, mental status, nausea/vomiting, pain level, respiratory function and temperature     Patient is stable for discharge or admission: yes     Patient tolerance:  Tolerated well, no immediate complications Reduction of dislocation  Date/Time: 08/05/2020 8:16 PM Performed by: Vladimir Crofts, MD Authorized by: Vladimir Crofts, MD  Local anesthesia used: no  Anesthesia: Local anesthesia used: no  Sedation: Patient sedated: yes Sedation type: moderate (conscious) sedation Sedatives: propofol Analgesia: fentanyl Vitals: Vital signs were monitored during sedation.  Patient tolerance: patient tolerated the procedure well with no immediate complications Comments: Traction, countertraction and abduction were used to reduce the left shoulder.   Well-tolerated without complications apparent.  Follow-up x-ray confirms successful reduction.  .Sedation  Date/Time: 08/05/2020 8:18 PM Performed by: Vladimir Crofts, MD Authorized by: Vladimir Crofts, MD   Consent:    Consent obtained:  Verbal   Consent given by:  Patient   Risks discussed:  Allergic reaction, dysrhythmia, inadequate sedation, nausea, prolonged hypoxia resulting in organ damage, prolonged sedation necessitating reversal, respiratory compromise necessitating ventilatory assistance and intubation and vomiting   Alternatives discussed:  Analgesia without sedation, anxiolysis and regional anesthesia Universal protocol:    Procedure explained and questions answered to patient or proxy's satisfaction: yes     Relevant documents present and verified: yes     Test results available and properly labeled: yes     Imaging studies available: yes     Required blood products, implants, devices, and special equipment available: yes     Site/side marked: yes     Immediately prior to procedure a time out was called: yes     Patient identity confirmation method:  Verbally with patient Indications:    Procedure necessitating sedation performed by:  Physician performing sedation Pre-sedation assessment:    Time since last food or drink:  12p   ASA classification: class 1 - normal, healthy patient     Neck mobility: normal     Mouth opening:  3 or more finger widths   Thyromental distance:  4 finger widths   Mallampati score:  I - soft palate, uvula, fauces, pillars visible   Pre-sedation assessments completed and reviewed: airway patency, cardiovascular function, hydration status, mental status, nausea/vomiting, pain level, respiratory function and temperature   Immediate pre-procedure details:    Reassessment: Patient reassessed immediately prior to procedure     Reviewed: vital signs, relevant labs/tests and NPO status     Verified: bag valve mask available, emergency equipment  available, intubation equipment available, IV patency confirmed, oxygen available and suction available   Procedure details (see MAR for exact dosages):    Preoxygenation:  Nasal cannula   Sedation:  Propofol   Intended level of sedation: deep   Analgesia:  Fentanyl   Intra-procedure monitoring:  Blood pressure monitoring, cardiac monitor, continuous pulse oximetry, frequent LOC assessments, frequent vital sign checks and continuous capnometry   Intra-procedure events: none     Intra-procedure management:  Airway repositioning   Total Provider sedation time (minutes):  8 Post-procedure details:    Attendance: Constant attendance by certified staff until patient recovered     Recovery: Patient returned to pre-procedure baseline     Post-sedation assessments completed and reviewed: airway patency, cardiovascular function, hydration status, mental status, nausea/vomiting, pain level, respiratory function and temperature     Patient is stable for discharge or admission: yes     Patient tolerance:  Tolerated well, no immediate complications  Medications  propofol (DIPRIVAN) 10 mg/mL bolus/IV push 100 mg (110 mg Intravenous Given 08/05/20 1740)  fentaNYL (SUBLIMAZE) injection 50 mcg (50 mcg Intravenous Given 08/05/20 1739)  fentaNYL (SUBLIMAZE) injection 50 mcg (50 mcg Intravenous Given 08/05/20 1841)  propofol (DIPRIVAN) 10 mg/mL bolus/IV push 100 mg (160 mg Intravenous Given 08/05/20 1843)    ____________________________________________   MDM / ED COURSE  Ambulatory 79 year old woman with history of RA presents to the ED after accidental fall causing traumatic left shoulder dislocation requiring bedside sedation for reduction.  Normal vital signs on room air.  Exam with neurovascularly intact LUE, but impaired mobility and pain consistent with fracture/dislocation.  She has some mild and vague tenderness over her right knee, but no discrete signs of trauma, and is otherwise without evidence  of acute traumatic pathology.  She looks well and is without distress.  X-rays confirm dislocation anteriorly of left shoulder with associated Hill-Sachs deformity, no evidence of PTX, rib fractures or right knee fracture/dislocation.  Consent obtained and patient sedated with propofol with reduction successfully of her left shoulder.  As she was awakening from the first reduction, I am concerned that she moved her arm outside of the sling and precipitated repeat dislocation.  This unfortunately necessitated a repeat sedation and reduction, and tighter sling placement afterwards, with good subsequent and sustained reduction of her left foot dislocation.  Patient awakens from sedation without evidence of complication return to behavioral baseline.  Tolerating p.o. intake.  Provided patient outpatient follow-up with orthopedics and we discussed outpatient management, return precautions for the ED were discussed.  Patient medically stable for discharge home.  Clinical Course as of Aug 05 2106  Fri Aug 05, 2020  1658 RN gathering supplies for sedation now. CXR reviewed.   [DS]  1751 Reduction complete.  Follow-up x-ray of left shoulder ordered.  Tolerated well without complications.   [DS]  4193 XR with persistently displaced shoulder.  Patient is back to her baseline mental status.  We discussed repeat sedation, and she is agreeable.   [DS]  1857 Repeat reduction complete.  Clinically reduced.   [DS]  1910 Repeat x-ray with evidence of good reduction.   [DS]    Clinical Course User Index [DS] Vladimir Crofts, MD     ____________________________________________   FINAL CLINICAL IMPRESSION(S) / ED DIAGNOSES  Final diagnoses:  Fall, initial encounter  Acute pain of left shoulder  Traumatic closed displaced fracture of left shoulder with anterior dislocation, initial encounter  Hill Sachs deformity, left     ED Discharge Orders    None       Opel Lejeune   Note:  This document was  prepared using Dragon voice recognition software and may include unintentional dictation errors.   Vladimir Crofts, MD 08/05/20 2109

## 2020-08-05 NOTE — ED Notes (Signed)
Report given to Mitch RN 

## 2020-08-05 NOTE — ED Notes (Signed)
Patient transported to X-ray 

## 2020-08-05 NOTE — ED Triage Notes (Signed)
Patient arrived by EMS from home for fall. C/o left shoulder pain. Pt reports tripping over her dog. A& ox4 upon arrival. EMS administered 158mcg fentanyl

## 2020-08-05 NOTE — Sedation Documentation (Signed)
Xray showed shoulder still out of place, EDP notified.

## 2020-08-05 NOTE — Sedation Documentation (Signed)
BTVM@ monitor stopped working during procedure.

## 2020-08-05 NOTE — Discharge Instructions (Addendum)
Please follow-up with Dr. Rudene Christians, one of her orthopedic surgeons, or one of the physicians in his group. Please call their office first thing the morning, or Monday morning, to set up a follow-up appointment.  Please get your left arm in a sling at all times for support and do not bear weight on his left arm. Continue all of your normal pain medications for RA. Please take Tylenol and ibuprofen/Advil for your pain.  It is safe to take them together, or to alternate them every few hours.  Take up to 1000mg  of Tylenol at a time, up to 4 times per day.  Do not take more than 4000 mg of Tylenol in 24 hours.  For ibuprofen, take 400-600 mg, 4-5 times per day.  You have any fevers, inability to use/feel your left arm or unbearable pain, please return to the ED.

## 2020-08-05 NOTE — ED Notes (Signed)
Pt AOX4, NAD noted. Pt states "I wanna go home." Husband at bedside.

## 2020-08-08 ENCOUNTER — Telehealth: Payer: Self-pay | Admitting: *Deleted

## 2020-08-08 NOTE — Telephone Encounter (Signed)
Patient report recent shoulder injury that will prevent her being able to have lung screening imaging. Rescheduled to 09/15/20.

## 2020-08-11 ENCOUNTER — Ambulatory Visit: Payer: Medicare Other

## 2020-08-15 DIAGNOSIS — S42252A Displaced fracture of greater tuberosity of left humerus, initial encounter for closed fracture: Secondary | ICD-10-CM | POA: Diagnosis not present

## 2020-08-15 DIAGNOSIS — S43005A Unspecified dislocation of left shoulder joint, initial encounter: Secondary | ICD-10-CM | POA: Diagnosis not present

## 2020-08-18 DIAGNOSIS — M810 Age-related osteoporosis without current pathological fracture: Secondary | ICD-10-CM | POA: Diagnosis not present

## 2020-08-22 DIAGNOSIS — Z96642 Presence of left artificial hip joint: Secondary | ICD-10-CM | POA: Diagnosis not present

## 2020-08-22 DIAGNOSIS — H3530 Unspecified macular degeneration: Secondary | ICD-10-CM | POA: Diagnosis not present

## 2020-08-22 DIAGNOSIS — R7303 Prediabetes: Secondary | ICD-10-CM | POA: Diagnosis not present

## 2020-08-22 DIAGNOSIS — I10 Essential (primary) hypertension: Secondary | ICD-10-CM | POA: Diagnosis not present

## 2020-08-22 DIAGNOSIS — Z9181 History of falling: Secondary | ICD-10-CM | POA: Diagnosis not present

## 2020-08-22 DIAGNOSIS — K219 Gastro-esophageal reflux disease without esophagitis: Secondary | ICD-10-CM | POA: Diagnosis not present

## 2020-08-22 DIAGNOSIS — M80022D Age-related osteoporosis with current pathological fracture, left humerus, subsequent encounter for fracture with routine healing: Secondary | ICD-10-CM | POA: Diagnosis not present

## 2020-08-22 DIAGNOSIS — E079 Disorder of thyroid, unspecified: Secondary | ICD-10-CM | POA: Diagnosis not present

## 2020-08-22 DIAGNOSIS — I4891 Unspecified atrial fibrillation: Secondary | ICD-10-CM | POA: Diagnosis not present

## 2020-08-22 DIAGNOSIS — M1991 Primary osteoarthritis, unspecified site: Secondary | ICD-10-CM | POA: Diagnosis not present

## 2020-08-22 DIAGNOSIS — G4700 Insomnia, unspecified: Secondary | ICD-10-CM | POA: Diagnosis not present

## 2020-08-24 DIAGNOSIS — M810 Age-related osteoporosis without current pathological fracture: Secondary | ICD-10-CM | POA: Diagnosis not present

## 2020-08-25 DIAGNOSIS — K219 Gastro-esophageal reflux disease without esophagitis: Secondary | ICD-10-CM | POA: Diagnosis not present

## 2020-08-25 DIAGNOSIS — H3530 Unspecified macular degeneration: Secondary | ICD-10-CM | POA: Diagnosis not present

## 2020-08-25 DIAGNOSIS — E079 Disorder of thyroid, unspecified: Secondary | ICD-10-CM | POA: Diagnosis not present

## 2020-08-25 DIAGNOSIS — R7303 Prediabetes: Secondary | ICD-10-CM | POA: Diagnosis not present

## 2020-08-25 DIAGNOSIS — M1991 Primary osteoarthritis, unspecified site: Secondary | ICD-10-CM | POA: Diagnosis not present

## 2020-08-25 DIAGNOSIS — M80022D Age-related osteoporosis with current pathological fracture, left humerus, subsequent encounter for fracture with routine healing: Secondary | ICD-10-CM | POA: Diagnosis not present

## 2020-08-25 DIAGNOSIS — Z9181 History of falling: Secondary | ICD-10-CM | POA: Diagnosis not present

## 2020-08-25 DIAGNOSIS — I4891 Unspecified atrial fibrillation: Secondary | ICD-10-CM | POA: Diagnosis not present

## 2020-08-25 DIAGNOSIS — G4700 Insomnia, unspecified: Secondary | ICD-10-CM | POA: Diagnosis not present

## 2020-08-25 DIAGNOSIS — Z96642 Presence of left artificial hip joint: Secondary | ICD-10-CM | POA: Diagnosis not present

## 2020-08-25 DIAGNOSIS — I10 Essential (primary) hypertension: Secondary | ICD-10-CM | POA: Diagnosis not present

## 2020-08-30 DIAGNOSIS — E079 Disorder of thyroid, unspecified: Secondary | ICD-10-CM | POA: Diagnosis not present

## 2020-08-30 DIAGNOSIS — I10 Essential (primary) hypertension: Secondary | ICD-10-CM | POA: Diagnosis not present

## 2020-08-30 DIAGNOSIS — R7303 Prediabetes: Secondary | ICD-10-CM | POA: Diagnosis not present

## 2020-08-30 DIAGNOSIS — Z9181 History of falling: Secondary | ICD-10-CM | POA: Diagnosis not present

## 2020-08-30 DIAGNOSIS — G4700 Insomnia, unspecified: Secondary | ICD-10-CM | POA: Diagnosis not present

## 2020-08-30 DIAGNOSIS — I4891 Unspecified atrial fibrillation: Secondary | ICD-10-CM | POA: Diagnosis not present

## 2020-08-30 DIAGNOSIS — M80022D Age-related osteoporosis with current pathological fracture, left humerus, subsequent encounter for fracture with routine healing: Secondary | ICD-10-CM | POA: Diagnosis not present

## 2020-08-30 DIAGNOSIS — K219 Gastro-esophageal reflux disease without esophagitis: Secondary | ICD-10-CM | POA: Diagnosis not present

## 2020-08-30 DIAGNOSIS — H3530 Unspecified macular degeneration: Secondary | ICD-10-CM | POA: Diagnosis not present

## 2020-08-30 DIAGNOSIS — Z96642 Presence of left artificial hip joint: Secondary | ICD-10-CM | POA: Diagnosis not present

## 2020-08-30 DIAGNOSIS — M1991 Primary osteoarthritis, unspecified site: Secondary | ICD-10-CM | POA: Diagnosis not present

## 2020-09-01 DIAGNOSIS — M80022D Age-related osteoporosis with current pathological fracture, left humerus, subsequent encounter for fracture with routine healing: Secondary | ICD-10-CM | POA: Diagnosis not present

## 2020-09-01 DIAGNOSIS — M1991 Primary osteoarthritis, unspecified site: Secondary | ICD-10-CM | POA: Diagnosis not present

## 2020-09-01 DIAGNOSIS — I4891 Unspecified atrial fibrillation: Secondary | ICD-10-CM | POA: Diagnosis not present

## 2020-09-01 DIAGNOSIS — K219 Gastro-esophageal reflux disease without esophagitis: Secondary | ICD-10-CM | POA: Diagnosis not present

## 2020-09-01 DIAGNOSIS — Z9181 History of falling: Secondary | ICD-10-CM | POA: Diagnosis not present

## 2020-09-01 DIAGNOSIS — R7303 Prediabetes: Secondary | ICD-10-CM | POA: Diagnosis not present

## 2020-09-01 DIAGNOSIS — G4700 Insomnia, unspecified: Secondary | ICD-10-CM | POA: Diagnosis not present

## 2020-09-01 DIAGNOSIS — E079 Disorder of thyroid, unspecified: Secondary | ICD-10-CM | POA: Diagnosis not present

## 2020-09-01 DIAGNOSIS — Z96642 Presence of left artificial hip joint: Secondary | ICD-10-CM | POA: Diagnosis not present

## 2020-09-01 DIAGNOSIS — I10 Essential (primary) hypertension: Secondary | ICD-10-CM | POA: Diagnosis not present

## 2020-09-01 DIAGNOSIS — H3530 Unspecified macular degeneration: Secondary | ICD-10-CM | POA: Diagnosis not present

## 2020-09-05 DIAGNOSIS — S43005D Unspecified dislocation of left shoulder joint, subsequent encounter: Secondary | ICD-10-CM | POA: Diagnosis not present

## 2020-09-05 DIAGNOSIS — S42252D Displaced fracture of greater tuberosity of left humerus, subsequent encounter for fracture with routine healing: Secondary | ICD-10-CM | POA: Diagnosis not present

## 2020-09-06 DIAGNOSIS — H3530 Unspecified macular degeneration: Secondary | ICD-10-CM | POA: Diagnosis not present

## 2020-09-06 DIAGNOSIS — I10 Essential (primary) hypertension: Secondary | ICD-10-CM | POA: Diagnosis not present

## 2020-09-06 DIAGNOSIS — M80022D Age-related osteoporosis with current pathological fracture, left humerus, subsequent encounter for fracture with routine healing: Secondary | ICD-10-CM | POA: Diagnosis not present

## 2020-09-06 DIAGNOSIS — R7303 Prediabetes: Secondary | ICD-10-CM | POA: Diagnosis not present

## 2020-09-06 DIAGNOSIS — G4700 Insomnia, unspecified: Secondary | ICD-10-CM | POA: Diagnosis not present

## 2020-09-06 DIAGNOSIS — Z96642 Presence of left artificial hip joint: Secondary | ICD-10-CM | POA: Diagnosis not present

## 2020-09-06 DIAGNOSIS — I4891 Unspecified atrial fibrillation: Secondary | ICD-10-CM | POA: Diagnosis not present

## 2020-09-06 DIAGNOSIS — K219 Gastro-esophageal reflux disease without esophagitis: Secondary | ICD-10-CM | POA: Diagnosis not present

## 2020-09-06 DIAGNOSIS — E079 Disorder of thyroid, unspecified: Secondary | ICD-10-CM | POA: Diagnosis not present

## 2020-09-06 DIAGNOSIS — Z9181 History of falling: Secondary | ICD-10-CM | POA: Diagnosis not present

## 2020-09-06 DIAGNOSIS — M1991 Primary osteoarthritis, unspecified site: Secondary | ICD-10-CM | POA: Diagnosis not present

## 2020-09-08 DIAGNOSIS — R7303 Prediabetes: Secondary | ICD-10-CM | POA: Diagnosis not present

## 2020-09-08 DIAGNOSIS — I10 Essential (primary) hypertension: Secondary | ICD-10-CM | POA: Diagnosis not present

## 2020-09-08 DIAGNOSIS — M80022D Age-related osteoporosis with current pathological fracture, left humerus, subsequent encounter for fracture with routine healing: Secondary | ICD-10-CM | POA: Diagnosis not present

## 2020-09-08 DIAGNOSIS — I4891 Unspecified atrial fibrillation: Secondary | ICD-10-CM | POA: Diagnosis not present

## 2020-09-08 DIAGNOSIS — H3530 Unspecified macular degeneration: Secondary | ICD-10-CM | POA: Diagnosis not present

## 2020-09-08 DIAGNOSIS — Z96642 Presence of left artificial hip joint: Secondary | ICD-10-CM | POA: Diagnosis not present

## 2020-09-08 DIAGNOSIS — M1991 Primary osteoarthritis, unspecified site: Secondary | ICD-10-CM | POA: Diagnosis not present

## 2020-09-08 DIAGNOSIS — G4700 Insomnia, unspecified: Secondary | ICD-10-CM | POA: Diagnosis not present

## 2020-09-08 DIAGNOSIS — Z9181 History of falling: Secondary | ICD-10-CM | POA: Diagnosis not present

## 2020-09-08 DIAGNOSIS — E079 Disorder of thyroid, unspecified: Secondary | ICD-10-CM | POA: Diagnosis not present

## 2020-09-08 DIAGNOSIS — K219 Gastro-esophageal reflux disease without esophagitis: Secondary | ICD-10-CM | POA: Diagnosis not present

## 2020-09-11 ENCOUNTER — Other Ambulatory Visit: Payer: Self-pay | Admitting: Nurse Practitioner

## 2020-09-11 DIAGNOSIS — K219 Gastro-esophageal reflux disease without esophagitis: Secondary | ICD-10-CM

## 2020-09-11 NOTE — Telephone Encounter (Signed)
Requested Prescriptions  Pending Prescriptions Disp Refills  . omeprazole (PRILOSEC) 20 MG capsule [Pharmacy Med Name: OMEPRAZOLE 20MG  CAPSULES] 90 capsule 3    Sig: TAKE 1 CAPSULE(20 MG) BY MOUTH DAILY     Gastroenterology: Proton Pump Inhibitors Passed - 09/11/2020 10:04 AM      Passed - Valid encounter within last 12 months    Recent Outpatient Visits          4 months ago Non-healing ulcer of lower leg, right, limited to breakdown of skin [facility])   [place], DO   8 months ago MGUS (monoclonal gammopathy of unknown significance)   [place], DO   8 months ago Essential hypertension   [facility] Olin Hauser, DO   1 year ago Essential hypertension   [facility] Mikey College, NP   1 year ago Tick-borne fever   [place], DO      Future Appointments            In 3 months [place], Devonne Doughty, [facility], [place]   In 3 months  [facility], [facility]

## 2020-09-13 ENCOUNTER — Other Ambulatory Visit: Payer: Self-pay | Admitting: Family Medicine

## 2020-09-13 DIAGNOSIS — E039 Hypothyroidism, unspecified: Secondary | ICD-10-CM

## 2020-09-13 DIAGNOSIS — I10 Essential (primary) hypertension: Secondary | ICD-10-CM

## 2020-09-13 DIAGNOSIS — G4709 Other insomnia: Secondary | ICD-10-CM

## 2020-09-14 DIAGNOSIS — Z9181 History of falling: Secondary | ICD-10-CM | POA: Diagnosis not present

## 2020-09-14 DIAGNOSIS — I4891 Unspecified atrial fibrillation: Secondary | ICD-10-CM | POA: Diagnosis not present

## 2020-09-14 DIAGNOSIS — M1991 Primary osteoarthritis, unspecified site: Secondary | ICD-10-CM | POA: Diagnosis not present

## 2020-09-14 DIAGNOSIS — M80022D Age-related osteoporosis with current pathological fracture, left humerus, subsequent encounter for fracture with routine healing: Secondary | ICD-10-CM | POA: Diagnosis not present

## 2020-09-14 DIAGNOSIS — R7303 Prediabetes: Secondary | ICD-10-CM | POA: Diagnosis not present

## 2020-09-14 DIAGNOSIS — E079 Disorder of thyroid, unspecified: Secondary | ICD-10-CM | POA: Diagnosis not present

## 2020-09-14 DIAGNOSIS — K219 Gastro-esophageal reflux disease without esophagitis: Secondary | ICD-10-CM | POA: Diagnosis not present

## 2020-09-14 DIAGNOSIS — Z96642 Presence of left artificial hip joint: Secondary | ICD-10-CM | POA: Diagnosis not present

## 2020-09-14 DIAGNOSIS — H3530 Unspecified macular degeneration: Secondary | ICD-10-CM | POA: Diagnosis not present

## 2020-09-14 DIAGNOSIS — I10 Essential (primary) hypertension: Secondary | ICD-10-CM | POA: Diagnosis not present

## 2020-09-14 DIAGNOSIS — G4700 Insomnia, unspecified: Secondary | ICD-10-CM | POA: Diagnosis not present

## 2020-09-15 ENCOUNTER — Other Ambulatory Visit: Payer: Self-pay

## 2020-09-15 ENCOUNTER — Ambulatory Visit
Admission: RE | Admit: 2020-09-15 | Discharge: 2020-09-15 | Disposition: A | Discharge status: Home / Self Care | Payer: Medicare Other | Source: Ambulatory Visit | Attending: Oncology | Admitting: Oncology

## 2020-09-15 DIAGNOSIS — J439 Emphysema, unspecified: Secondary | ICD-10-CM | POA: Diagnosis not present

## 2020-09-15 DIAGNOSIS — N1330 Unspecified hydronephrosis: Secondary | ICD-10-CM | POA: Diagnosis not present

## 2020-09-15 DIAGNOSIS — M47814 Spondylosis without myelopathy or radiculopathy, thoracic region: Secondary | ICD-10-CM | POA: Diagnosis not present

## 2020-09-15 DIAGNOSIS — I2510 Atherosclerotic heart disease of native coronary artery without angina pectoris: Secondary | ICD-10-CM | POA: Insufficient documentation

## 2020-09-15 DIAGNOSIS — I700 Atherosclerosis of aorta: Secondary | ICD-10-CM | POA: Diagnosis not present

## 2020-09-15 DIAGNOSIS — R918 Other nonspecific abnormal finding of lung field: Secondary | ICD-10-CM | POA: Insufficient documentation

## 2020-09-15 DIAGNOSIS — Z122 Encounter for screening for malignant neoplasm of respiratory organs: Secondary | ICD-10-CM | POA: Diagnosis not present

## 2020-09-15 DIAGNOSIS — Z87891 Personal history of nicotine dependence: Secondary | ICD-10-CM | POA: Diagnosis not present

## 2020-09-15 DIAGNOSIS — I77810 Thoracic aortic ectasia: Secondary | ICD-10-CM | POA: Diagnosis not present

## 2020-09-15 DIAGNOSIS — J432 Centrilobular emphysema: Secondary | ICD-10-CM | POA: Diagnosis not present

## 2020-09-16 DIAGNOSIS — K219 Gastro-esophageal reflux disease without esophagitis: Secondary | ICD-10-CM | POA: Diagnosis not present

## 2020-09-16 DIAGNOSIS — H3530 Unspecified macular degeneration: Secondary | ICD-10-CM | POA: Diagnosis not present

## 2020-09-16 DIAGNOSIS — E079 Disorder of thyroid, unspecified: Secondary | ICD-10-CM | POA: Diagnosis not present

## 2020-09-16 DIAGNOSIS — Z96642 Presence of left artificial hip joint: Secondary | ICD-10-CM | POA: Diagnosis not present

## 2020-09-16 DIAGNOSIS — I10 Essential (primary) hypertension: Secondary | ICD-10-CM | POA: Diagnosis not present

## 2020-09-16 DIAGNOSIS — Z9181 History of falling: Secondary | ICD-10-CM | POA: Diagnosis not present

## 2020-09-16 DIAGNOSIS — M1991 Primary osteoarthritis, unspecified site: Secondary | ICD-10-CM | POA: Diagnosis not present

## 2020-09-16 DIAGNOSIS — R7303 Prediabetes: Secondary | ICD-10-CM | POA: Diagnosis not present

## 2020-09-16 DIAGNOSIS — I4891 Unspecified atrial fibrillation: Secondary | ICD-10-CM | POA: Diagnosis not present

## 2020-09-16 DIAGNOSIS — M80022D Age-related osteoporosis with current pathological fracture, left humerus, subsequent encounter for fracture with routine healing: Secondary | ICD-10-CM | POA: Diagnosis not present

## 2020-09-16 DIAGNOSIS — G4700 Insomnia, unspecified: Secondary | ICD-10-CM | POA: Diagnosis not present

## 2020-09-17 ENCOUNTER — Encounter: Payer: Self-pay | Admitting: *Deleted

## 2020-09-19 DIAGNOSIS — I10 Essential (primary) hypertension: Secondary | ICD-10-CM | POA: Diagnosis not present

## 2020-09-19 DIAGNOSIS — E079 Disorder of thyroid, unspecified: Secondary | ICD-10-CM | POA: Diagnosis not present

## 2020-09-19 DIAGNOSIS — R7303 Prediabetes: Secondary | ICD-10-CM | POA: Diagnosis not present

## 2020-09-19 DIAGNOSIS — M1991 Primary osteoarthritis, unspecified site: Secondary | ICD-10-CM | POA: Diagnosis not present

## 2020-09-19 DIAGNOSIS — G4700 Insomnia, unspecified: Secondary | ICD-10-CM | POA: Diagnosis not present

## 2020-09-19 DIAGNOSIS — Z9181 History of falling: Secondary | ICD-10-CM | POA: Diagnosis not present

## 2020-09-19 DIAGNOSIS — I4891 Unspecified atrial fibrillation: Secondary | ICD-10-CM | POA: Diagnosis not present

## 2020-09-19 DIAGNOSIS — K219 Gastro-esophageal reflux disease without esophagitis: Secondary | ICD-10-CM | POA: Diagnosis not present

## 2020-09-19 DIAGNOSIS — Z96642 Presence of left artificial hip joint: Secondary | ICD-10-CM | POA: Diagnosis not present

## 2020-09-19 DIAGNOSIS — H3530 Unspecified macular degeneration: Secondary | ICD-10-CM | POA: Diagnosis not present

## 2020-09-19 DIAGNOSIS — M80022D Age-related osteoporosis with current pathological fracture, left humerus, subsequent encounter for fracture with routine healing: Secondary | ICD-10-CM | POA: Diagnosis not present

## 2020-09-20 DIAGNOSIS — I4891 Unspecified atrial fibrillation: Secondary | ICD-10-CM | POA: Diagnosis not present

## 2020-09-20 DIAGNOSIS — E079 Disorder of thyroid, unspecified: Secondary | ICD-10-CM | POA: Diagnosis not present

## 2020-09-20 DIAGNOSIS — Z96642 Presence of left artificial hip joint: Secondary | ICD-10-CM | POA: Diagnosis not present

## 2020-09-20 DIAGNOSIS — M80022D Age-related osteoporosis with current pathological fracture, left humerus, subsequent encounter for fracture with routine healing: Secondary | ICD-10-CM | POA: Diagnosis not present

## 2020-09-20 DIAGNOSIS — K219 Gastro-esophageal reflux disease without esophagitis: Secondary | ICD-10-CM | POA: Diagnosis not present

## 2020-09-20 DIAGNOSIS — R7303 Prediabetes: Secondary | ICD-10-CM | POA: Diagnosis not present

## 2020-09-20 DIAGNOSIS — H3530 Unspecified macular degeneration: Secondary | ICD-10-CM | POA: Diagnosis not present

## 2020-09-20 DIAGNOSIS — M1991 Primary osteoarthritis, unspecified site: Secondary | ICD-10-CM | POA: Diagnosis not present

## 2020-09-20 DIAGNOSIS — I10 Essential (primary) hypertension: Secondary | ICD-10-CM | POA: Diagnosis not present

## 2020-09-20 DIAGNOSIS — G4700 Insomnia, unspecified: Secondary | ICD-10-CM | POA: Diagnosis not present

## 2020-09-20 DIAGNOSIS — Z9181 History of falling: Secondary | ICD-10-CM | POA: Diagnosis not present

## 2020-09-22 DIAGNOSIS — M80052S Age-related osteoporosis with current pathological fracture, left femur, sequela: Secondary | ICD-10-CM | POA: Diagnosis not present

## 2020-09-22 DIAGNOSIS — Z79899 Other long term (current) drug therapy: Secondary | ICD-10-CM | POA: Diagnosis not present

## 2020-09-22 DIAGNOSIS — M8949 Other hypertrophic osteoarthropathy, multiple sites: Secondary | ICD-10-CM | POA: Diagnosis not present

## 2020-09-22 DIAGNOSIS — M059 Rheumatoid arthritis with rheumatoid factor, unspecified: Secondary | ICD-10-CM | POA: Diagnosis not present

## 2020-09-22 DIAGNOSIS — M792 Neuralgia and neuritis, unspecified: Secondary | ICD-10-CM | POA: Diagnosis not present

## 2020-09-26 ENCOUNTER — Ambulatory Visit (INDEPENDENT_AMBULATORY_CARE_PROVIDER_SITE_OTHER): Payer: Medicare Other | Admitting: Family Medicine

## 2020-09-26 DIAGNOSIS — Z23 Encounter for immunization: Secondary | ICD-10-CM | POA: Diagnosis not present

## 2020-09-27 DIAGNOSIS — R7303 Prediabetes: Secondary | ICD-10-CM | POA: Diagnosis not present

## 2020-09-27 DIAGNOSIS — E079 Disorder of thyroid, unspecified: Secondary | ICD-10-CM | POA: Diagnosis not present

## 2020-09-27 DIAGNOSIS — I4891 Unspecified atrial fibrillation: Secondary | ICD-10-CM | POA: Diagnosis not present

## 2020-09-27 DIAGNOSIS — Z96642 Presence of left artificial hip joint: Secondary | ICD-10-CM | POA: Diagnosis not present

## 2020-09-27 DIAGNOSIS — M80022D Age-related osteoporosis with current pathological fracture, left humerus, subsequent encounter for fracture with routine healing: Secondary | ICD-10-CM | POA: Diagnosis not present

## 2020-09-27 DIAGNOSIS — K219 Gastro-esophageal reflux disease without esophagitis: Secondary | ICD-10-CM | POA: Diagnosis not present

## 2020-09-27 DIAGNOSIS — I10 Essential (primary) hypertension: Secondary | ICD-10-CM | POA: Diagnosis not present

## 2020-09-27 DIAGNOSIS — M1991 Primary osteoarthritis, unspecified site: Secondary | ICD-10-CM | POA: Diagnosis not present

## 2020-09-27 DIAGNOSIS — H3530 Unspecified macular degeneration: Secondary | ICD-10-CM | POA: Diagnosis not present

## 2020-09-27 DIAGNOSIS — Z9181 History of falling: Secondary | ICD-10-CM | POA: Diagnosis not present

## 2020-09-27 DIAGNOSIS — G4700 Insomnia, unspecified: Secondary | ICD-10-CM | POA: Diagnosis not present

## 2020-09-29 DIAGNOSIS — M810 Age-related osteoporosis without current pathological fracture: Secondary | ICD-10-CM | POA: Diagnosis not present

## 2020-10-10 ENCOUNTER — Encounter: Payer: Self-pay | Admitting: Oncology

## 2020-10-10 DIAGNOSIS — S42252D Displaced fracture of greater tuberosity of left humerus, subsequent encounter for fracture with routine healing: Secondary | ICD-10-CM | POA: Diagnosis not present

## 2020-10-10 DIAGNOSIS — S43005D Unspecified dislocation of left shoulder joint, subsequent encounter: Secondary | ICD-10-CM | POA: Diagnosis not present

## 2020-10-17 DIAGNOSIS — K219 Gastro-esophageal reflux disease without esophagitis: Secondary | ICD-10-CM | POA: Diagnosis not present

## 2020-10-17 DIAGNOSIS — Z9181 History of falling: Secondary | ICD-10-CM | POA: Diagnosis not present

## 2020-10-17 DIAGNOSIS — R7303 Prediabetes: Secondary | ICD-10-CM | POA: Diagnosis not present

## 2020-10-17 DIAGNOSIS — I10 Essential (primary) hypertension: Secondary | ICD-10-CM | POA: Diagnosis not present

## 2020-10-17 DIAGNOSIS — H3530 Unspecified macular degeneration: Secondary | ICD-10-CM | POA: Diagnosis not present

## 2020-10-17 DIAGNOSIS — I4891 Unspecified atrial fibrillation: Secondary | ICD-10-CM | POA: Diagnosis not present

## 2020-10-17 DIAGNOSIS — M80022D Age-related osteoporosis with current pathological fracture, left humerus, subsequent encounter for fracture with routine healing: Secondary | ICD-10-CM | POA: Diagnosis not present

## 2020-10-17 DIAGNOSIS — Z96642 Presence of left artificial hip joint: Secondary | ICD-10-CM | POA: Diagnosis not present

## 2020-10-17 DIAGNOSIS — G4700 Insomnia, unspecified: Secondary | ICD-10-CM | POA: Diagnosis not present

## 2020-10-17 DIAGNOSIS — E079 Disorder of thyroid, unspecified: Secondary | ICD-10-CM | POA: Diagnosis not present

## 2020-10-17 DIAGNOSIS — M1991 Primary osteoarthritis, unspecified site: Secondary | ICD-10-CM | POA: Diagnosis not present

## 2020-10-21 DIAGNOSIS — M80022D Age-related osteoporosis with current pathological fracture, left humerus, subsequent encounter for fracture with routine healing: Secondary | ICD-10-CM | POA: Diagnosis not present

## 2020-10-21 DIAGNOSIS — I2510 Atherosclerotic heart disease of native coronary artery without angina pectoris: Secondary | ICD-10-CM | POA: Diagnosis not present

## 2020-10-21 DIAGNOSIS — D500 Iron deficiency anemia secondary to blood loss (chronic): Secondary | ICD-10-CM | POA: Diagnosis not present

## 2020-10-21 DIAGNOSIS — R7303 Prediabetes: Secondary | ICD-10-CM | POA: Diagnosis not present

## 2020-10-21 DIAGNOSIS — K219 Gastro-esophageal reflux disease without esophagitis: Secondary | ICD-10-CM | POA: Diagnosis not present

## 2020-10-21 DIAGNOSIS — I4891 Unspecified atrial fibrillation: Secondary | ICD-10-CM | POA: Diagnosis not present

## 2020-10-21 DIAGNOSIS — Z8673 Personal history of transient ischemic attack (TIA), and cerebral infarction without residual deficits: Secondary | ICD-10-CM | POA: Diagnosis not present

## 2020-10-21 DIAGNOSIS — E785 Hyperlipidemia, unspecified: Secondary | ICD-10-CM | POA: Diagnosis not present

## 2020-10-21 DIAGNOSIS — K5900 Constipation, unspecified: Secondary | ICD-10-CM | POA: Diagnosis not present

## 2020-10-21 DIAGNOSIS — E440 Moderate protein-calorie malnutrition: Secondary | ICD-10-CM | POA: Diagnosis not present

## 2020-10-21 DIAGNOSIS — I7300 Raynaud's syndrome without gangrene: Secondary | ICD-10-CM | POA: Diagnosis not present

## 2020-10-21 DIAGNOSIS — Z853 Personal history of malignant neoplasm of breast: Secondary | ICD-10-CM | POA: Diagnosis not present

## 2020-10-21 DIAGNOSIS — G4700 Insomnia, unspecified: Secondary | ICD-10-CM | POA: Diagnosis not present

## 2020-10-21 DIAGNOSIS — I429 Cardiomyopathy, unspecified: Secondary | ICD-10-CM | POA: Diagnosis not present

## 2020-10-21 DIAGNOSIS — M1710 Unilateral primary osteoarthritis, unspecified knee: Secondary | ICD-10-CM | POA: Diagnosis not present

## 2020-10-21 DIAGNOSIS — G4733 Obstructive sleep apnea (adult) (pediatric): Secondary | ICD-10-CM | POA: Diagnosis not present

## 2020-10-21 DIAGNOSIS — Z87891 Personal history of nicotine dependence: Secondary | ICD-10-CM | POA: Diagnosis not present

## 2020-10-21 DIAGNOSIS — I5032 Chronic diastolic (congestive) heart failure: Secondary | ICD-10-CM | POA: Diagnosis not present

## 2020-10-21 DIAGNOSIS — H3530 Unspecified macular degeneration: Secondary | ICD-10-CM | POA: Diagnosis not present

## 2020-10-21 DIAGNOSIS — Z96642 Presence of left artificial hip joint: Secondary | ICD-10-CM | POA: Diagnosis not present

## 2020-10-21 DIAGNOSIS — Z86010 Personal history of colonic polyps: Secondary | ICD-10-CM | POA: Diagnosis not present

## 2020-10-21 DIAGNOSIS — E079 Disorder of thyroid, unspecified: Secondary | ICD-10-CM | POA: Diagnosis not present

## 2020-10-21 DIAGNOSIS — I110 Hypertensive heart disease with heart failure: Secondary | ICD-10-CM | POA: Diagnosis not present

## 2020-10-24 DIAGNOSIS — Z96642 Presence of left artificial hip joint: Secondary | ICD-10-CM | POA: Diagnosis not present

## 2020-10-24 DIAGNOSIS — E440 Moderate protein-calorie malnutrition: Secondary | ICD-10-CM | POA: Diagnosis not present

## 2020-10-24 DIAGNOSIS — Z86010 Personal history of colonic polyps: Secondary | ICD-10-CM | POA: Diagnosis not present

## 2020-10-24 DIAGNOSIS — R7303 Prediabetes: Secondary | ICD-10-CM | POA: Diagnosis not present

## 2020-10-24 DIAGNOSIS — D500 Iron deficiency anemia secondary to blood loss (chronic): Secondary | ICD-10-CM | POA: Diagnosis not present

## 2020-10-24 DIAGNOSIS — M1710 Unilateral primary osteoarthritis, unspecified knee: Secondary | ICD-10-CM | POA: Diagnosis not present

## 2020-10-24 DIAGNOSIS — H3530 Unspecified macular degeneration: Secondary | ICD-10-CM | POA: Diagnosis not present

## 2020-10-24 DIAGNOSIS — M80022D Age-related osteoporosis with current pathological fracture, left humerus, subsequent encounter for fracture with routine healing: Secondary | ICD-10-CM | POA: Diagnosis not present

## 2020-10-24 DIAGNOSIS — E079 Disorder of thyroid, unspecified: Secondary | ICD-10-CM | POA: Diagnosis not present

## 2020-10-24 DIAGNOSIS — Z853 Personal history of malignant neoplasm of breast: Secondary | ICD-10-CM | POA: Diagnosis not present

## 2020-10-24 DIAGNOSIS — Z87891 Personal history of nicotine dependence: Secondary | ICD-10-CM | POA: Diagnosis not present

## 2020-10-24 DIAGNOSIS — I429 Cardiomyopathy, unspecified: Secondary | ICD-10-CM | POA: Diagnosis not present

## 2020-10-24 DIAGNOSIS — E785 Hyperlipidemia, unspecified: Secondary | ICD-10-CM | POA: Diagnosis not present

## 2020-10-24 DIAGNOSIS — K5900 Constipation, unspecified: Secondary | ICD-10-CM | POA: Diagnosis not present

## 2020-10-24 DIAGNOSIS — I7300 Raynaud's syndrome without gangrene: Secondary | ICD-10-CM | POA: Diagnosis not present

## 2020-10-24 DIAGNOSIS — I2510 Atherosclerotic heart disease of native coronary artery without angina pectoris: Secondary | ICD-10-CM | POA: Diagnosis not present

## 2020-10-24 DIAGNOSIS — I5032 Chronic diastolic (congestive) heart failure: Secondary | ICD-10-CM | POA: Diagnosis not present

## 2020-10-24 DIAGNOSIS — K219 Gastro-esophageal reflux disease without esophagitis: Secondary | ICD-10-CM | POA: Diagnosis not present

## 2020-10-24 DIAGNOSIS — I4891 Unspecified atrial fibrillation: Secondary | ICD-10-CM | POA: Diagnosis not present

## 2020-10-24 DIAGNOSIS — Z8673 Personal history of transient ischemic attack (TIA), and cerebral infarction without residual deficits: Secondary | ICD-10-CM | POA: Diagnosis not present

## 2020-10-24 DIAGNOSIS — I110 Hypertensive heart disease with heart failure: Secondary | ICD-10-CM | POA: Diagnosis not present

## 2020-10-24 DIAGNOSIS — G4733 Obstructive sleep apnea (adult) (pediatric): Secondary | ICD-10-CM | POA: Diagnosis not present

## 2020-10-24 DIAGNOSIS — G4700 Insomnia, unspecified: Secondary | ICD-10-CM | POA: Diagnosis not present

## 2020-10-28 DIAGNOSIS — E440 Moderate protein-calorie malnutrition: Secondary | ICD-10-CM | POA: Diagnosis not present

## 2020-10-28 DIAGNOSIS — I5032 Chronic diastolic (congestive) heart failure: Secondary | ICD-10-CM | POA: Diagnosis not present

## 2020-10-28 DIAGNOSIS — H3530 Unspecified macular degeneration: Secondary | ICD-10-CM | POA: Diagnosis not present

## 2020-10-28 DIAGNOSIS — E079 Disorder of thyroid, unspecified: Secondary | ICD-10-CM | POA: Diagnosis not present

## 2020-10-28 DIAGNOSIS — M80022D Age-related osteoporosis with current pathological fracture, left humerus, subsequent encounter for fracture with routine healing: Secondary | ICD-10-CM | POA: Diagnosis not present

## 2020-10-28 DIAGNOSIS — Z96642 Presence of left artificial hip joint: Secondary | ICD-10-CM | POA: Diagnosis not present

## 2020-10-28 DIAGNOSIS — D500 Iron deficiency anemia secondary to blood loss (chronic): Secondary | ICD-10-CM | POA: Diagnosis not present

## 2020-10-28 DIAGNOSIS — Z87891 Personal history of nicotine dependence: Secondary | ICD-10-CM | POA: Diagnosis not present

## 2020-10-28 DIAGNOSIS — I7300 Raynaud's syndrome without gangrene: Secondary | ICD-10-CM | POA: Diagnosis not present

## 2020-10-28 DIAGNOSIS — I4891 Unspecified atrial fibrillation: Secondary | ICD-10-CM | POA: Diagnosis not present

## 2020-10-28 DIAGNOSIS — I110 Hypertensive heart disease with heart failure: Secondary | ICD-10-CM | POA: Diagnosis not present

## 2020-10-28 DIAGNOSIS — Z853 Personal history of malignant neoplasm of breast: Secondary | ICD-10-CM | POA: Diagnosis not present

## 2020-10-28 DIAGNOSIS — Z86010 Personal history of colonic polyps: Secondary | ICD-10-CM | POA: Diagnosis not present

## 2020-10-28 DIAGNOSIS — K219 Gastro-esophageal reflux disease without esophagitis: Secondary | ICD-10-CM | POA: Diagnosis not present

## 2020-10-28 DIAGNOSIS — M1710 Unilateral primary osteoarthritis, unspecified knee: Secondary | ICD-10-CM | POA: Diagnosis not present

## 2020-10-28 DIAGNOSIS — G4733 Obstructive sleep apnea (adult) (pediatric): Secondary | ICD-10-CM | POA: Diagnosis not present

## 2020-10-28 DIAGNOSIS — Z8673 Personal history of transient ischemic attack (TIA), and cerebral infarction without residual deficits: Secondary | ICD-10-CM | POA: Diagnosis not present

## 2020-10-28 DIAGNOSIS — R7303 Prediabetes: Secondary | ICD-10-CM | POA: Diagnosis not present

## 2020-10-28 DIAGNOSIS — E785 Hyperlipidemia, unspecified: Secondary | ICD-10-CM | POA: Diagnosis not present

## 2020-10-28 DIAGNOSIS — K5900 Constipation, unspecified: Secondary | ICD-10-CM | POA: Diagnosis not present

## 2020-10-28 DIAGNOSIS — I429 Cardiomyopathy, unspecified: Secondary | ICD-10-CM | POA: Diagnosis not present

## 2020-10-28 DIAGNOSIS — I2510 Atherosclerotic heart disease of native coronary artery without angina pectoris: Secondary | ICD-10-CM | POA: Diagnosis not present

## 2020-10-28 DIAGNOSIS — G4700 Insomnia, unspecified: Secondary | ICD-10-CM | POA: Diagnosis not present

## 2020-10-31 ENCOUNTER — Other Ambulatory Visit: Payer: Medicare Other

## 2020-10-31 DIAGNOSIS — I4891 Unspecified atrial fibrillation: Secondary | ICD-10-CM | POA: Diagnosis not present

## 2020-10-31 DIAGNOSIS — D500 Iron deficiency anemia secondary to blood loss (chronic): Secondary | ICD-10-CM | POA: Diagnosis not present

## 2020-10-31 DIAGNOSIS — E440 Moderate protein-calorie malnutrition: Secondary | ICD-10-CM | POA: Diagnosis not present

## 2020-10-31 DIAGNOSIS — H3530 Unspecified macular degeneration: Secondary | ICD-10-CM | POA: Diagnosis not present

## 2020-10-31 DIAGNOSIS — Z87891 Personal history of nicotine dependence: Secondary | ICD-10-CM | POA: Diagnosis not present

## 2020-10-31 DIAGNOSIS — I110 Hypertensive heart disease with heart failure: Secondary | ICD-10-CM | POA: Diagnosis not present

## 2020-10-31 DIAGNOSIS — K5900 Constipation, unspecified: Secondary | ICD-10-CM | POA: Diagnosis not present

## 2020-10-31 DIAGNOSIS — Z86010 Personal history of colonic polyps: Secondary | ICD-10-CM | POA: Diagnosis not present

## 2020-10-31 DIAGNOSIS — G4700 Insomnia, unspecified: Secondary | ICD-10-CM | POA: Diagnosis not present

## 2020-10-31 DIAGNOSIS — I429 Cardiomyopathy, unspecified: Secondary | ICD-10-CM | POA: Diagnosis not present

## 2020-10-31 DIAGNOSIS — I7300 Raynaud's syndrome without gangrene: Secondary | ICD-10-CM | POA: Diagnosis not present

## 2020-10-31 DIAGNOSIS — G4733 Obstructive sleep apnea (adult) (pediatric): Secondary | ICD-10-CM | POA: Diagnosis not present

## 2020-10-31 DIAGNOSIS — K219 Gastro-esophageal reflux disease without esophagitis: Secondary | ICD-10-CM | POA: Diagnosis not present

## 2020-10-31 DIAGNOSIS — E785 Hyperlipidemia, unspecified: Secondary | ICD-10-CM | POA: Diagnosis not present

## 2020-10-31 DIAGNOSIS — M80022D Age-related osteoporosis with current pathological fracture, left humerus, subsequent encounter for fracture with routine healing: Secondary | ICD-10-CM | POA: Diagnosis not present

## 2020-10-31 DIAGNOSIS — I2510 Atherosclerotic heart disease of native coronary artery without angina pectoris: Secondary | ICD-10-CM | POA: Diagnosis not present

## 2020-10-31 DIAGNOSIS — Z8673 Personal history of transient ischemic attack (TIA), and cerebral infarction without residual deficits: Secondary | ICD-10-CM | POA: Diagnosis not present

## 2020-10-31 DIAGNOSIS — I5032 Chronic diastolic (congestive) heart failure: Secondary | ICD-10-CM | POA: Diagnosis not present

## 2020-10-31 DIAGNOSIS — Z853 Personal history of malignant neoplasm of breast: Secondary | ICD-10-CM | POA: Diagnosis not present

## 2020-10-31 DIAGNOSIS — M1710 Unilateral primary osteoarthritis, unspecified knee: Secondary | ICD-10-CM | POA: Diagnosis not present

## 2020-10-31 DIAGNOSIS — Z96642 Presence of left artificial hip joint: Secondary | ICD-10-CM | POA: Diagnosis not present

## 2020-10-31 DIAGNOSIS — R7303 Prediabetes: Secondary | ICD-10-CM | POA: Diagnosis not present

## 2020-10-31 DIAGNOSIS — E079 Disorder of thyroid, unspecified: Secondary | ICD-10-CM | POA: Diagnosis not present

## 2020-11-02 DIAGNOSIS — Z8673 Personal history of transient ischemic attack (TIA), and cerebral infarction without residual deficits: Secondary | ICD-10-CM | POA: Diagnosis not present

## 2020-11-02 DIAGNOSIS — G4733 Obstructive sleep apnea (adult) (pediatric): Secondary | ICD-10-CM | POA: Diagnosis not present

## 2020-11-02 DIAGNOSIS — I7300 Raynaud's syndrome without gangrene: Secondary | ICD-10-CM | POA: Diagnosis not present

## 2020-11-02 DIAGNOSIS — M1710 Unilateral primary osteoarthritis, unspecified knee: Secondary | ICD-10-CM | POA: Diagnosis not present

## 2020-11-02 DIAGNOSIS — E785 Hyperlipidemia, unspecified: Secondary | ICD-10-CM | POA: Diagnosis not present

## 2020-11-02 DIAGNOSIS — I110 Hypertensive heart disease with heart failure: Secondary | ICD-10-CM | POA: Diagnosis not present

## 2020-11-02 DIAGNOSIS — I4891 Unspecified atrial fibrillation: Secondary | ICD-10-CM | POA: Diagnosis not present

## 2020-11-02 DIAGNOSIS — I5032 Chronic diastolic (congestive) heart failure: Secondary | ICD-10-CM | POA: Diagnosis not present

## 2020-11-02 DIAGNOSIS — Z853 Personal history of malignant neoplasm of breast: Secondary | ICD-10-CM | POA: Diagnosis not present

## 2020-11-02 DIAGNOSIS — D500 Iron deficiency anemia secondary to blood loss (chronic): Secondary | ICD-10-CM | POA: Diagnosis not present

## 2020-11-02 DIAGNOSIS — M80022D Age-related osteoporosis with current pathological fracture, left humerus, subsequent encounter for fracture with routine healing: Secondary | ICD-10-CM | POA: Diagnosis not present

## 2020-11-02 DIAGNOSIS — G4700 Insomnia, unspecified: Secondary | ICD-10-CM | POA: Diagnosis not present

## 2020-11-02 DIAGNOSIS — Z87891 Personal history of nicotine dependence: Secondary | ICD-10-CM | POA: Diagnosis not present

## 2020-11-02 DIAGNOSIS — I2510 Atherosclerotic heart disease of native coronary artery without angina pectoris: Secondary | ICD-10-CM | POA: Diagnosis not present

## 2020-11-02 DIAGNOSIS — E079 Disorder of thyroid, unspecified: Secondary | ICD-10-CM | POA: Diagnosis not present

## 2020-11-02 DIAGNOSIS — H3530 Unspecified macular degeneration: Secondary | ICD-10-CM | POA: Diagnosis not present

## 2020-11-02 DIAGNOSIS — K5900 Constipation, unspecified: Secondary | ICD-10-CM | POA: Diagnosis not present

## 2020-11-02 DIAGNOSIS — K219 Gastro-esophageal reflux disease without esophagitis: Secondary | ICD-10-CM | POA: Diagnosis not present

## 2020-11-02 DIAGNOSIS — Z96642 Presence of left artificial hip joint: Secondary | ICD-10-CM | POA: Diagnosis not present

## 2020-11-02 DIAGNOSIS — I429 Cardiomyopathy, unspecified: Secondary | ICD-10-CM | POA: Diagnosis not present

## 2020-11-02 DIAGNOSIS — R7303 Prediabetes: Secondary | ICD-10-CM | POA: Diagnosis not present

## 2020-11-02 DIAGNOSIS — Z86010 Personal history of colonic polyps: Secondary | ICD-10-CM | POA: Diagnosis not present

## 2020-11-02 DIAGNOSIS — E440 Moderate protein-calorie malnutrition: Secondary | ICD-10-CM | POA: Diagnosis not present

## 2020-11-03 ENCOUNTER — Other Ambulatory Visit: Payer: Self-pay

## 2020-11-03 ENCOUNTER — Encounter: Payer: Self-pay | Admitting: Family Medicine

## 2020-11-03 ENCOUNTER — Telehealth (INDEPENDENT_AMBULATORY_CARE_PROVIDER_SITE_OTHER): Payer: Medicare Other | Admitting: Family Medicine

## 2020-11-03 DIAGNOSIS — R300 Dysuria: Secondary | ICD-10-CM

## 2020-11-03 MED ORDER — NITROFURANTOIN MONOHYD MACRO 100 MG PO CAPS: 100.0000 mg | ORAL_CAPSULE | Freq: Two times a day (BID) | ORAL | 0 refills | Status: AC

## 2020-11-03 MED ORDER — PHENAZOPYRIDINE HCL 100 MG PO TABS: 100.0000 mg | ORAL_TABLET | Freq: Three times a day (TID) | ORAL | 0 refills | Status: AC | PRN

## 2020-11-03 NOTE — Assessment & Plan Note (Signed)
Will treat at UTI based on symptoms.  To begin macrobid 100mg  BID x 5 days and pyridium 100mg  TID PRN for urinary discomfort.  Discussed to RTC if symptoms worsen or fail to improve and we would need her to RTC for urine sample to be sent to lab.  Patient in agreement with plan.

## 2020-11-03 NOTE — Progress Notes (Signed)
Virtual Visit via Telephone  The purpose of this virtual visit is to provide medical care while limiting exposure to the novel coronavirus (COVID19) for both patient and office staff.  Consent was obtained for phone visit:  Yes.   Answered questions that patient had about telehealth interaction:  Yes.   I discussed the limitations, risks, security and privacy concerns of performing an evaluation and management service by telephone. I also discussed with the patient that there may be a patient responsible charge related to this service. The patient expressed understanding and agreed to proceed.  Patient is at home and is accessed via telephone Services are provided by Harlin Rain, FNP-C from [facility])  ---------------------------------------------------------------------- Chief Complaint  Patient presents with  . Hematuria    Urinary frequency, urinary urgency x 1 week     S: Reviewed CMA documentation. I have called patient and gathered additional HPI as follows:  Alexis Owens presents for virtual telemedicine visit via telephone with concerns of urinary frequency, urgency, dysuria and hematuria x 1 week.  Denies fevers, chills/shakes, feelings of incomplete emptying, incontinence, abdominal pain, n/v/d.  Patient is currently home Denies any high risk travel to areas of current concern for COVID19. Denies any known or suspected exposure to person with or possibly with COVID19.  Past Medical History:  Diagnosis Date  . Allergy   . Arthritis   . Bunion   . Chronic sinusitis   . COPD (chronic obstructive pulmonary disease) ([place])   . High risk medication use   . Hyperlipidemia   . Hypertension   . Hypothyroidism   . Insomnia   . Macular degeneration   . Osteoporosis   . Paroxysmal A-fib ([place])   . Prediabetes   . Rheumatoid arthritis ([place])    Social History   Tobacco Use  . Smoking status: Former Smoker    Quit date: 07/19/2014    Years  since quitting: 6.2  . Smokeless tobacco: Never Used  Vaping Use  . Vaping Use: Never used  Substance Use Topics  . Alcohol use: Not Currently    Alcohol/week: 14.0 standard drinks    Types: 14 Glasses of wine per week  . Drug use: No    Current Outpatient Medications:  .  albuterol (VENTOLIN HFA) 108 (90 Base) MCG/ACT inhaler, INHALE 1 TO 2 PUFFS INTO THE LUNGS EVERY 6 HOURS AS NEEDED FOR WHEEZING OR SHORTNESS OF BREATH, Disp: 8.5 g, Rfl: 2 .  diltiazem (DILACOR XR) 180 MG 24 hr capsule, Take 1 capsule (180 mg total) by mouth daily., Disp: 90 capsule, Rfl: 1 .  fluticasone (FLONASE) 50 MCG/ACT nasal spray, SHAKE LIQUID AND USE 2 SPRAYS IN EACH NOSTRIL DAILY, Disp: 48 g, Rfl: 0 .  folic acid (FOLVITE) 1 MG tablet, Take 1 mg by mouth daily., Disp: , Rfl:  .  gabapentin (NEURONTIN) 100 MG capsule, TAKE 1 CAPSULE BY MOUTH TWICE DAILY AS NEEDED THEN TAKE 3 CAPSULES BY MOUTH AT BEDTIME, Disp: 450 capsule, Rfl: 0 .  hydroxychloroquine (PLAQUENIL) 200 MG tablet, Take 300 mg daily (1 tab and a half ) daily, 90 days, Disp: , Rfl:  .  levothyroxine (SYNTHROID) 25 MCG tablet, TAKE 1 TABLET(25 MCG) BY MOUTH DAILY BEFORE BREAKFAST, Disp: 90 tablet, Rfl: 1 .  lisinopril (ZESTRIL) 20 MG tablet, TAKE 1 TABLET(20 MG) BY MOUTH DAILY, Disp: 90 tablet, Rfl: 1 .  methotrexate 50 MG/2ML injection, Take 0.8 ml once a week , 12 weeks, Disp: , Rfl:  .  mupirocin ointment (BACTROBAN) 2 %, Apply 1 application topically 2 (two) times daily. For up to 2 weeks., Disp: 30 g, Rfl: 1 .  promethazine (PHENERGAN) 12.5 MG tablet, TAKE 1 TO 2 TABLETS(12.5 TO 25 MG) BY MOUTH EVERY 8 HOURS AS NEEDED FOR NAUSEA OR VOMITING, Disp: 30 tablet, Rfl: 2 .  traZODone (DESYREL) 50 MG tablet, TAKE 1 TABLET(50 MG) BY MOUTH AT BEDTIME, Disp: 90 tablet, Rfl: 1 .  zoledronic acid (RECLAST) 5 MG/100ML SOLN injection, Inject 5 mg into the vein once., Disp: , Rfl:  .  alendronate (FOSAMAX) 70 MG tablet, TAKE 1 TABLET BY MOUTH ONCE A WEEK, WITH A  FULL GLASS OF WATER ON AN EMPTY STOMACH, REMAIN UPRIGHT FOR 30 MINUTES (Patient not taking: Reported on 11/03/2020), Disp: 12 tablet, Rfl: 0 .  furosemide (LASIX) 20 MG tablet, Take 1 tablet (20 mg total) by mouth daily as needed for fluid or edema. Short term use only 3-5 days as needed at a time. (Patient not taking: Reported on 11/03/2020), Disp: 30 tablet, Rfl: 2 .  ibuprofen (ADVIL) 200 MG tablet, Take 400 mg by mouth 2 (two) times daily. (Patient not taking: Reported on 11/03/2020), Disp: , Rfl:  .  nitrofurantoin, macrocrystal-monohydrate, (MACROBID) 100 MG capsule, Take 1 capsule (100 mg total) by mouth 2 (two) times daily for 5 days., Disp: 10 capsule, Rfl: 0 .  omeprazole (PRILOSEC) 20 MG capsule, TAKE 1 CAPSULE(20 MG) BY MOUTH DAILY (Patient not taking: Reported on 11/03/2020), Disp: 90 capsule, Rfl: 3 .  phenazopyridine (PYRIDIUM) 100 MG tablet, Take 1 tablet (100 mg total) by mouth 3 (three) times daily as needed for up to 2 days for pain., Disp: 6 tablet, Rfl: 0  Depression screen [facility] 2/9 01/15/2020 12/18/2019 05/11/2019  Decreased Interest 0 0 0  Down, Depressed, Hopeless 0 0 0  PHQ - 2 Score 0 0 0  Altered sleeping - - -  Tired, decreased energy - - -  Change in appetite - - -  Feeling bad or failure about yourself  - - -  Trouble concentrating - - -  Moving slowly or fidgety/restless - - -  Suicidal thoughts - - -  PHQ-9 Score - - -    No flowsheet data found.  -------------------------------------------------------------------------- O: No physical exam performed due to remote telephone encounter.  Physical Exam: Patient remotely monitored without video.  Verbal communication appropriate.  Cognition normal.  No results found for this or any previous visit (from the past 2160 hour(s)).  -------------------------------------------------------------------------- A&P:  Problem List Items Addressed This Visit      Other   Dysuria - Primary    Will treat at UTI based on  symptoms.  To begin macrobid 100mg  BID x 5 days and pyridium 100mg  TID PRN for urinary discomfort.  Discussed to RTC if symptoms worsen or fail to improve and we would need her to RTC for urine sample to be sent to lab.  Patient in agreement with plan.      Relevant Medications   nitrofurantoin, macrocrystal-monohydrate, (MACROBID) 100 MG capsule   phenazopyridine (PYRIDIUM) 100 MG tablet      Meds ordered this encounter  Medications  . nitrofurantoin, macrocrystal-monohydrate, (MACROBID) 100 MG capsule    Sig: Take 1 capsule (100 mg total) by mouth 2 (two) times daily for 5 days.    Dispense:  10 capsule    Refill:  0  . phenazopyridine (PYRIDIUM) 100 MG tablet    Sig: Take 1 tablet (100 mg total) by mouth  3 (three) times daily as needed for up to 2 days for pain.    Dispense:  6 tablet    Refill:  0    Follow-up: - Return if symptoms worsen or fail to improve  Patient verbalizes understanding with the above medical recommendations including the limitation of remote medical advice.  Specific follow-up and call-back criteria were given for patient to follow-up or seek medical care more urgently if needed.  - Time spent in direct consultation with patient on phone: 6 minutes  Harlin Rain, [facility] 11/03/2020, 1:09 PM

## 2020-11-08 DIAGNOSIS — G4709 Other insomnia: Secondary | ICD-10-CM

## 2020-11-08 MED ORDER — TRAZODONE HCL 50 MG PO TABS: 50.0000 mg | ORAL_TABLET | Freq: Every day | ORAL | 1 refills | Status: DC

## 2020-11-14 ENCOUNTER — Ambulatory Visit: Payer: Medicare Other | Admitting: Oncology

## 2020-11-16 DIAGNOSIS — Z8673 Personal history of transient ischemic attack (TIA), and cerebral infarction without residual deficits: Secondary | ICD-10-CM | POA: Diagnosis not present

## 2020-11-16 DIAGNOSIS — I429 Cardiomyopathy, unspecified: Secondary | ICD-10-CM | POA: Diagnosis not present

## 2020-11-16 DIAGNOSIS — Z853 Personal history of malignant neoplasm of breast: Secondary | ICD-10-CM | POA: Diagnosis not present

## 2020-11-16 DIAGNOSIS — Z86010 Personal history of colonic polyps: Secondary | ICD-10-CM | POA: Diagnosis not present

## 2020-11-16 DIAGNOSIS — D500 Iron deficiency anemia secondary to blood loss (chronic): Secondary | ICD-10-CM | POA: Diagnosis not present

## 2020-11-16 DIAGNOSIS — E079 Disorder of thyroid, unspecified: Secondary | ICD-10-CM | POA: Diagnosis not present

## 2020-11-16 DIAGNOSIS — E785 Hyperlipidemia, unspecified: Secondary | ICD-10-CM | POA: Diagnosis not present

## 2020-11-16 DIAGNOSIS — I4891 Unspecified atrial fibrillation: Secondary | ICD-10-CM | POA: Diagnosis not present

## 2020-11-16 DIAGNOSIS — I5032 Chronic diastolic (congestive) heart failure: Secondary | ICD-10-CM | POA: Diagnosis not present

## 2020-11-16 DIAGNOSIS — M80022D Age-related osteoporosis with current pathological fracture, left humerus, subsequent encounter for fracture with routine healing: Secondary | ICD-10-CM | POA: Diagnosis not present

## 2020-11-16 DIAGNOSIS — I2510 Atherosclerotic heart disease of native coronary artery without angina pectoris: Secondary | ICD-10-CM | POA: Diagnosis not present

## 2020-11-16 DIAGNOSIS — I110 Hypertensive heart disease with heart failure: Secondary | ICD-10-CM | POA: Diagnosis not present

## 2020-11-16 DIAGNOSIS — G4733 Obstructive sleep apnea (adult) (pediatric): Secondary | ICD-10-CM | POA: Diagnosis not present

## 2020-11-16 DIAGNOSIS — Z87891 Personal history of nicotine dependence: Secondary | ICD-10-CM | POA: Diagnosis not present

## 2020-11-16 DIAGNOSIS — K219 Gastro-esophageal reflux disease without esophagitis: Secondary | ICD-10-CM | POA: Diagnosis not present

## 2020-11-16 DIAGNOSIS — R7303 Prediabetes: Secondary | ICD-10-CM | POA: Diagnosis not present

## 2020-11-16 DIAGNOSIS — K5900 Constipation, unspecified: Secondary | ICD-10-CM | POA: Diagnosis not present

## 2020-11-16 DIAGNOSIS — Z96642 Presence of left artificial hip joint: Secondary | ICD-10-CM | POA: Diagnosis not present

## 2020-11-16 DIAGNOSIS — H3530 Unspecified macular degeneration: Secondary | ICD-10-CM | POA: Diagnosis not present

## 2020-11-16 DIAGNOSIS — G4700 Insomnia, unspecified: Secondary | ICD-10-CM | POA: Diagnosis not present

## 2020-11-16 DIAGNOSIS — I7300 Raynaud's syndrome without gangrene: Secondary | ICD-10-CM | POA: Diagnosis not present

## 2020-11-16 DIAGNOSIS — M1710 Unilateral primary osteoarthritis, unspecified knee: Secondary | ICD-10-CM | POA: Diagnosis not present

## 2020-11-16 DIAGNOSIS — E440 Moderate protein-calorie malnutrition: Secondary | ICD-10-CM | POA: Diagnosis not present

## 2020-12-08 IMAGING — CT CT CHEST LUNG CANCER SCREENING LOW DOSE W/O CM
2 of 5 series · 15 of 40 positions shown, 18 images · non-contrast
Comparison: None.

CLINICAL DATA: 78-year-old female former smoker, quit 6 years ago,
with 57 pack-year history of smoking, for initial lung cancer
screening

EXAM:
CT CHEST WITHOUT CONTRAST LOW-DOSE FOR LUNG CANCER SCREENING
TECHNIQUE: Multidetector CT imaging of the chest was performed following the
standard protocol without IV contrast.

[Series 3: lung 1.00 · axial · 0.68mm/px · z∈[-1308,-975]mm · 12 of 369 slices shown, 15 images]
[im 18/369  mediastinal]
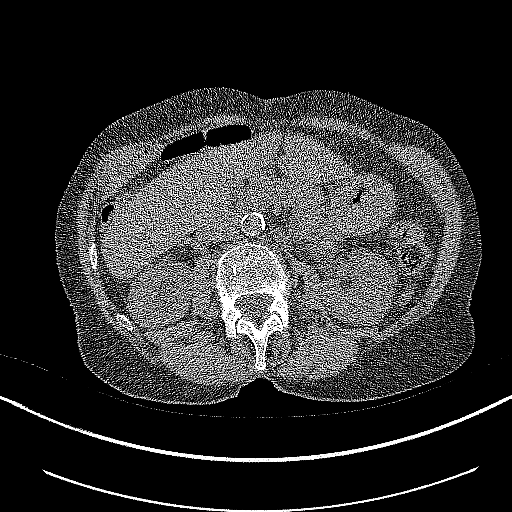
[im 18/369  lung]
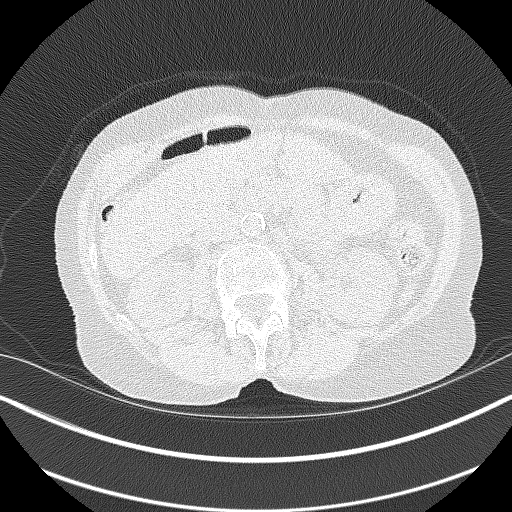
[im 53/369  lung]
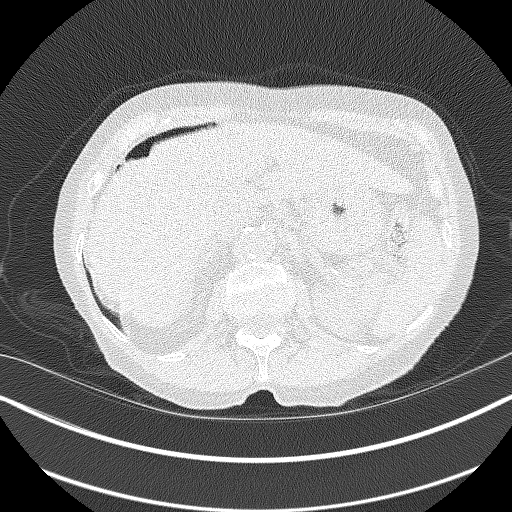
[im 88/369  lung]
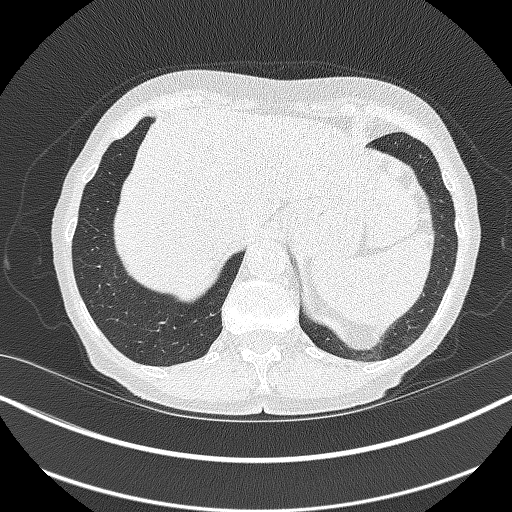
[im 106/369  lung]
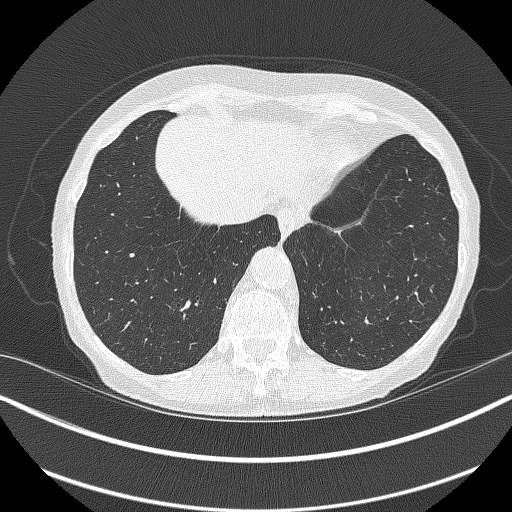
[im 141/369  mediastinal]
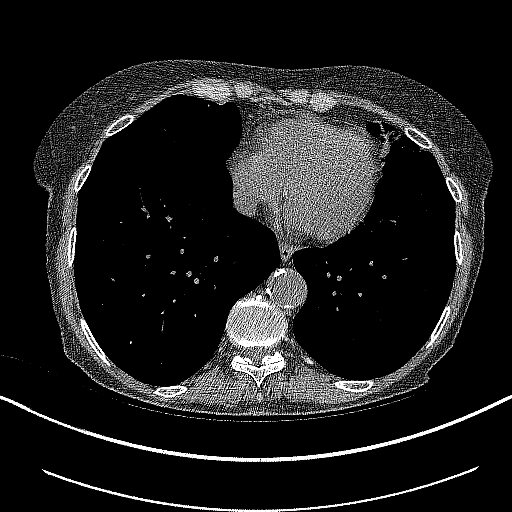
[im 141/369  lung]
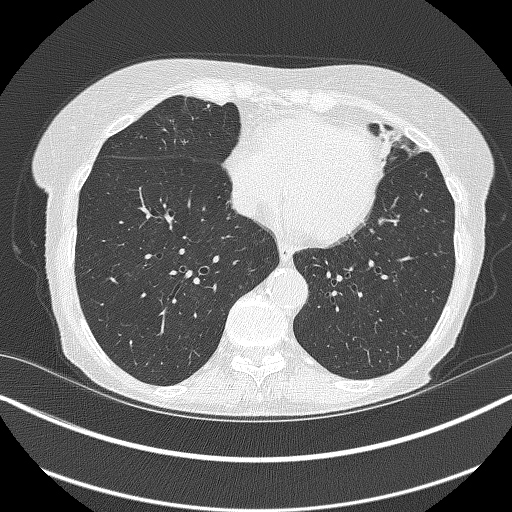
[im 176/369  lung]
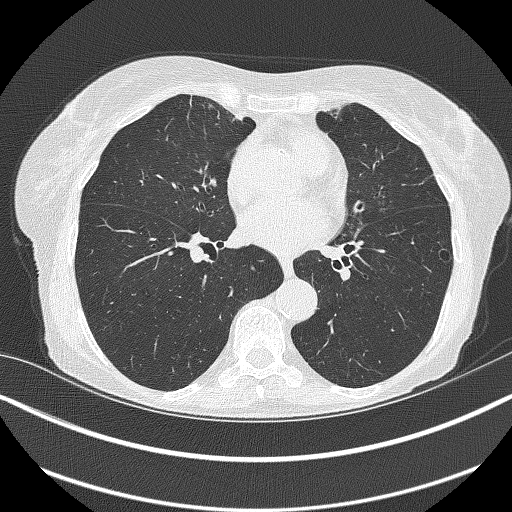
[im 193/369  lung]
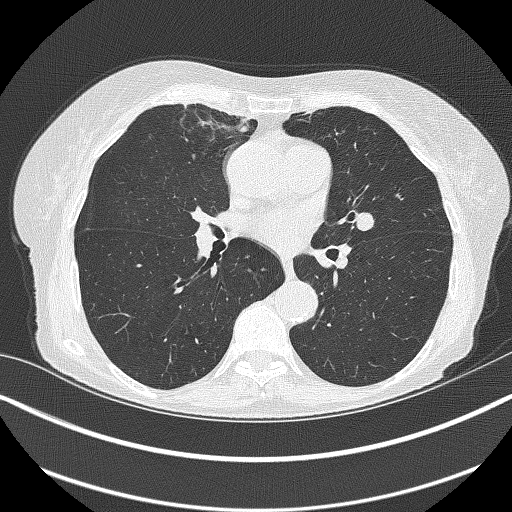
[im 228/369  lung]
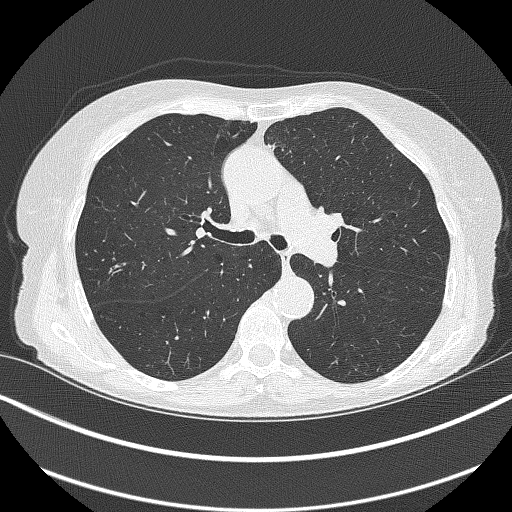
[im 263/369  mediastinal]
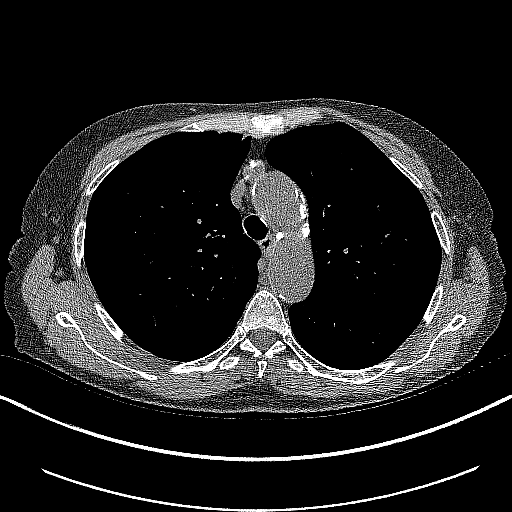
[im 263/369  lung]
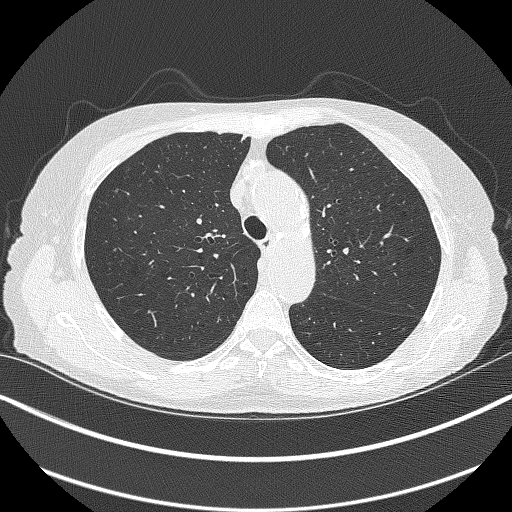
[im 281/369  lung]
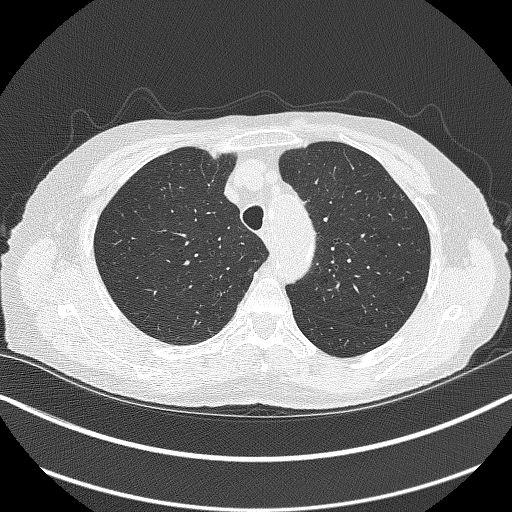
[im 316/369  lung]
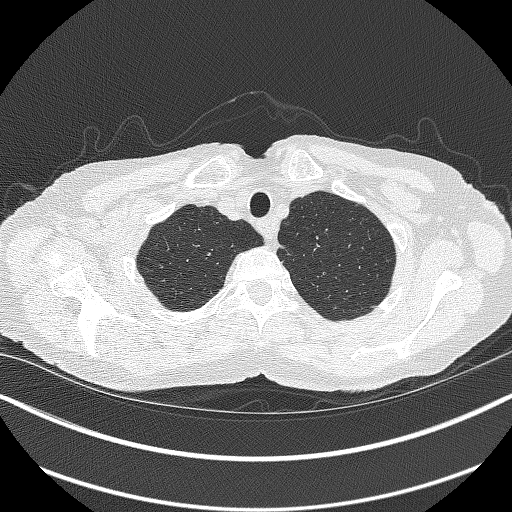
[im 351/369  lung]
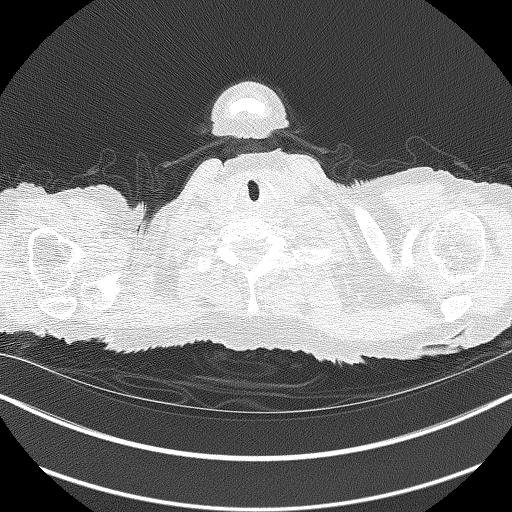

[Series 4: coronals lung 1.00 cor · coronal · 0.68mm/px · 3 of 304 slices shown]
[im 61/304  lung]
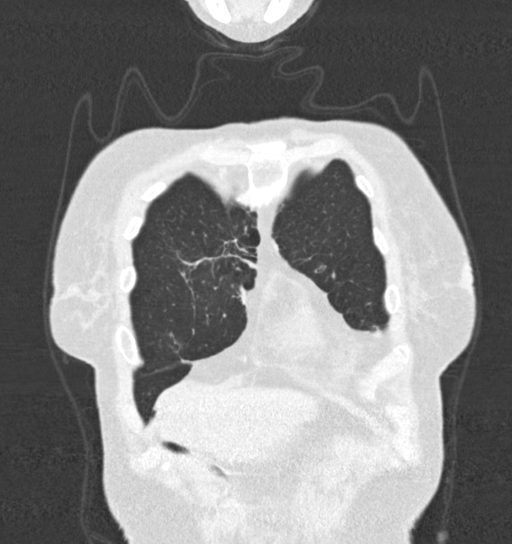
[im 122/304  lung]
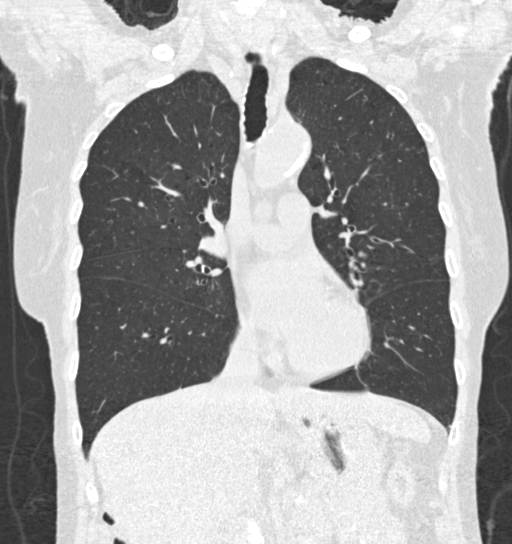
[im 182/304  lung]
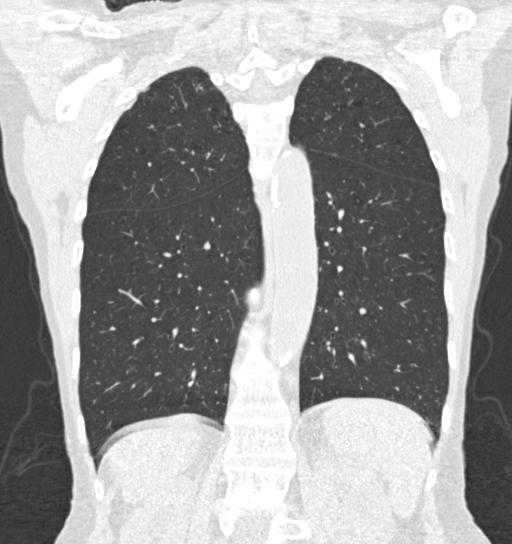

[15 of 40 positions shown; findings below may reference images not displayed]

FINDINGS: Cardiovascular: Heart is normal in size.  No pericardial effusion.

No evidence of thoracic aortic aneurysm. Atherosclerotic
calcifications of the aortic arch.

Mild coronary atherosclerosis of the left main coronary artery.

Mediastinum/Nodes: No suspicious mediastinal lymphadenopathy.

Visualized thyroid is unremarkable.

Lungs/Pleura: Mild biapical pleural-parenchymal scarring, right
greater than left. Scarring/atelectasis in the medial right middle
lobe and lingula.

Mild centrilobular emphysematous changes, upper lung predominant.

No focal consolidation.

Dominant 13.9 mm smoothly marginated/rounded nodule in the left
upper lobe (image 134). Additional small subpleural nodules in the
right upper lobe measuring up to 3.7 mm.

No pleural effusion or pneumothorax.

Upper Abdomen: Visualized upper abdomen is notable for vascular
calcifications and mild fullness of the bilateral renal collecting
systems.

Musculoskeletal: Visualized osseous structures are within normal
limits.
IMPRESSION: Lung-RADS 4A, suspicious. Follow up low-dose chest CT without
contrast in 3 months (please use the following order, "CT CHEST LCS
NODULE FOLLOW-UP W/O CM") is recommended. Alternatively, PET may be
considered.

13.9 mm rounded nodule in the left upper lobe.

Aortic Atherosclerosis (MWHJK-FHM.M) and Emphysema (MWHJK-SY7.J).

## 2020-12-09 ENCOUNTER — Other Ambulatory Visit: Payer: Self-pay | Admitting: Family Medicine

## 2020-12-09 DIAGNOSIS — E039 Hypothyroidism, unspecified: Secondary | ICD-10-CM

## 2020-12-09 DIAGNOSIS — I10 Essential (primary) hypertension: Secondary | ICD-10-CM

## 2020-12-13 ENCOUNTER — Other Ambulatory Visit: Payer: Medicare Other

## 2020-12-20 ENCOUNTER — Encounter: Payer: Medicare Other | Admitting: Family Medicine

## 2020-12-20 ENCOUNTER — Ambulatory Visit (INDEPENDENT_AMBULATORY_CARE_PROVIDER_SITE_OTHER): Payer: Medicare Other

## 2020-12-20 ENCOUNTER — Other Ambulatory Visit: Payer: Self-pay

## 2020-12-20 VITALS — Ht 62.0 in | Wt 120.0 lb

## 2020-12-20 DIAGNOSIS — I10 Essential (primary) hypertension: Secondary | ICD-10-CM

## 2020-12-20 DIAGNOSIS — E782 Mixed hyperlipidemia: Secondary | ICD-10-CM

## 2020-12-20 DIAGNOSIS — R7303 Prediabetes: Secondary | ICD-10-CM

## 2020-12-20 DIAGNOSIS — E039 Hypothyroidism, unspecified: Secondary | ICD-10-CM

## 2020-12-20 DIAGNOSIS — Z0000 Encounter for general adult medical examination without abnormal findings: Secondary | ICD-10-CM

## 2020-12-20 NOTE — Patient Instructions (Signed)
Alexis Owens , Thank you for taking time to come for your Medicare Wellness Visit. I appreciate your ongoing commitment to your health goals. Please review the following plan we discussed and let me know if I can assist you in the future.   Screening recommendations/referrals: Colonoscopy: not required Mammogram: not required Bone Density: 12/17/2016 Recommended yearly ophthalmology/optometry visit for glaucoma screening and checkup Recommended yearly dental visit for hygiene and checkup  Vaccinations: Influenza vaccine: completed 09/26/2020, due 06/12/2021 Pneumococcal vaccine: decline Tdap vaccine: completed 06/12/2019 Shingles vaccine: discussed   Covid-19: 10/11/2020, 03/11/2020, 02/19/2020  Advanced directives: Please bring a copy of your POA (Power of Attorney) and/or Living Will to your next appointment.   Conditions/risks identified: none  Next appointment: Follow up in one year for your annual wellness visit    Preventive Care 65 Years and Older, Female Preventive care refers to lifestyle choices and visits with your health care provider that can promote health and wellness. What does preventive care include?  A yearly physical exam. This is also called an annual well check.  Dental exams once or twice a year.  Routine eye exams. Ask your health care provider how often you should have your eyes checked.  Personal lifestyle choices, including:  Daily care of your teeth and gums.  Regular physical activity.  Eating a healthy diet.  Avoiding tobacco and drug use.  Limiting alcohol use.  Practicing safe sex.  Taking low-dose aspirin every day.  Taking vitamin and mineral supplements as recommended by your health care provider. What happens during an annual well check? The services and screenings done by your health care provider during your annual well check will depend on your age, overall health, lifestyle risk factors, and family history of disease. Counseling   Your health care provider may ask you questions about your:  Alcohol use.  Tobacco use.  Drug use.  Emotional well-being.  Home and relationship well-being.  Sexual activity.  Eating habits.  History of falls.  Memory and ability to understand (cognition).  Work and work Statistician.  Reproductive health. Screening  You may have the following tests or measurements:  Height, weight, and BMI.  Blood pressure.  Lipid and cholesterol levels. These may be checked every 5 years, or more frequently if you are over 61 years old.  Skin check.  Lung cancer screening. You may have this screening every year starting at age 41 if you have a 30-pack-year history of smoking and currently smoke or have quit within the past 15 years.  Fecal occult blood test (FOBT) of the stool. You may have this test every year starting at age 87.  Flexible sigmoidoscopy or colonoscopy. You may have a sigmoidoscopy every 5 years or a colonoscopy every 10 years starting at age 40.  Hepatitis C blood test.  Hepatitis B blood test.  Sexually transmitted disease (STD) testing.  Diabetes screening. This is done by checking your blood sugar (glucose) after you have not eaten for a while (fasting). You may have this done every 1-3 years.  Bone density scan. This is done to screen for osteoporosis. You may have this done starting at age 11.  Mammogram. This may be done every 1-2 years. Talk to your health care provider about how often you should have regular mammograms. Talk with your health care provider about your test results, treatment options, and if necessary, the need for more tests. Vaccines  Your health care provider may recommend certain vaccines, such as:  Influenza vaccine. This is recommended  every year.  Tetanus, diphtheria, and acellular pertussis (Tdap, Td) vaccine. You may need a Td booster every 10 years.  Zoster vaccine. You may need this after age 33.  Pneumococcal 13-valent  conjugate (PCV13) vaccine. One dose is recommended after age 73.  Pneumococcal polysaccharide (PPSV23) vaccine. One dose is recommended after age 70. Talk to your health care provider about which screenings and vaccines you need and how often you need them. This information is not intended to replace advice given to you by your health care provider. Make sure you discuss any questions you have with your health care provider. Document Released: 11/25/2015 Document Revised: 07/18/2016 Document Reviewed: 08/30/2015 Elsevier Interactive Patient Education  2017 [place] Prevention in the Home Falls can cause injuries. They can happen to people of all ages. There are many things you can do to make your home safe and to help prevent falls. What can I do on the outside of my home?  Regularly fix the edges of walkways and driveways and fix any cracks.  Remove anything that might make you trip as you walk through a door, such as a raised step or threshold.  Trim any bushes or trees on the path to your home.  Use bright outdoor lighting.  Clear any walking paths of anything that might make someone trip, such as rocks or tools.  Regularly check to see if handrails are loose or broken. Make sure that both sides of any steps have handrails.  Any raised decks and porches should have guardrails on the edges.  Have any leaves, snow, or ice cleared regularly.  Use sand or salt on walking paths during winter.  Clean up any spills in your garage right away. This includes oil or grease spills. What can I do in the bathroom?  Use night lights.  Install grab bars by the toilet and in the tub and shower. Do not use towel bars as grab bars.  Use non-skid mats or decals in the tub or shower.  If you need to sit down in the shower, use a plastic, non-slip stool.  Keep the floor dry. Clean up any water that spills on the floor as soon as it happens.  Remove soap buildup in the tub or  shower regularly.  Attach bath mats securely with double-sided non-slip rug tape.  Do not have throw rugs and other things on the floor that can make you trip. What can I do in the bedroom?  Use night lights.  Make sure that you have a light by your bed that is easy to reach.  Do not use any sheets or blankets that are too big for your bed. They should not hang down onto the floor.  Have a firm chair that has side arms. You can use this for support while you get dressed.  Do not have throw rugs and other things on the floor that can make you trip. What can I do in the kitchen?  Clean up any spills right away.  Avoid walking on wet floors.  Keep items that you use a lot in easy-to-reach places.  If you need to reach something above you, use a strong step stool that has a grab bar.  Keep electrical cords out of the way.  Do not use floor polish or wax that makes floors slippery. If you must use wax, use non-skid floor wax.  Do not have throw rugs and other things on the floor that can make you trip. What  can I do with my stairs?  Do not leave any items on the stairs.  Make sure that there are handrails on both sides of the stairs and use them. Fix handrails that are broken or loose. Make sure that handrails are as long as the stairways.  Check any carpeting to make sure that it is firmly attached to the stairs. Fix any carpet that is loose or worn.  Avoid having throw rugs at the top or bottom of the stairs. If you do have throw rugs, attach them to the floor with carpet tape.  Make sure that you have a light switch at the top of the stairs and the bottom of the stairs. If you do not have them, ask someone to add them for you. What else can I do to help prevent falls?  Wear shoes that:  Do not have high heels.  Have rubber bottoms.  Are comfortable and fit you well.  Are closed at the toe. Do not wear sandals.  If you use a stepladder:  Make sure that it is fully  opened. Do not climb a closed stepladder.  Make sure that both sides of the stepladder are locked into place.  Ask someone to hold it for you, if possible.  Clearly mark and make sure that you can see:  Any grab bars or handrails.  First and last steps.  Where the edge of each step is.  Use tools that help you move around (mobility aids) if they are needed. These include:  Canes.  Walkers.  Scooters.  Crutches.  Turn on the lights when you go into a dark area. Replace any light bulbs as soon as they burn out.  Set up your furniture so you have a clear path. Avoid moving your furniture around.  If any of your floors are uneven, fix them.  If there are any pets around you, be aware of where they are.  Review your medicines with your doctor. Some medicines can make you feel dizzy. This can increase your chance of falling. Ask your doctor what other things that you can do to help prevent falls. This information is not intended to replace advice given to you by your health care provider. Make sure you discuss any questions you have with your health care provider. Document Released: 08/25/2009 Document Revised: 04/05/2016 Document Reviewed: 12/03/2014 Elsevier Interactive Patient Education  2017 [employer].

## 2020-12-20 NOTE — Progress Notes (Signed)
I connected with [name] today by telephone and verified that I am speaking with the correct person using two identifiers. Location patient: home Location provider: work Persons participating in the virtual visit: Sumaya Riedesel. Spouse Cephus Slater LPN.   I discussed the limitations, risks, security and privacy concerns of performing an evaluation and management service by telephone and the availability of in person appointments. I also discussed with the patient that there may be a patient responsible charge related to this service. The patient expressed understanding and verbally consented to this telephonic visit.    Interactive audio and video telecommunications were attempted between this provider and patient, however failed, due to patient having technical difficulties OR patient did not have access to video capability.  We continued and completed visit with audio only.     Vital signs may be patient reported or missing.  Subjective:   Alexis Owens is a 80 y.o. female who presents for Medicare Annual (Subsequent) preventive examination.  Review of Systems     Cardiac Risk Factors include: advanced age (>57men, >40 women);dyslipidemia;hypertension;sedentary lifestyle     Objective:    Today's Vitals   12/20/20 0937 12/20/20 0939  Weight: 120 lb (54.4 kg)   Height: 5\' 2"  (1.575 m)   PainSc:  8    Body mass index is 21.95 kg/m.  Advanced Directives 12/20/2020 08/05/2020 04/29/2020 01/14/2020 12/18/2017  Does Patient Have a Medical Advance Directive? Yes No Yes Yes No  Type of Advance Directive Living will;Out of facility DNR (pink MOST or yellow form) - Living will Living will -  Would patient like information on creating a medical advance directive? - No - Patient declined - - No - Patient declined    Current Medications (verified) Outpatient Encounter Medications as of 12/20/2020  Medication Sig  . albuterol (VENTOLIN HFA) 108 (90 Base) MCG/ACT inhaler  INHALE 1 TO 2 PUFFS INTO THE LUNGS EVERY 6 HOURS AS NEEDED FOR WHEEZING OR SHORTNESS OF BREATH  . diltiazem (DILACOR XR) 180 MG 24 hr capsule Take 1 capsule (180 mg total) by mouth daily.  . fluticasone (FLONASE) 50 MCG/ACT nasal spray SHAKE LIQUID AND USE 2 SPRAYS IN EACH NOSTRIL DAILY  . folic acid (FOLVITE) 1 MG tablet Take 1 mg by mouth daily.  Marland Kitchen gabapentin (NEURONTIN) 100 MG capsule TAKE 1 CAPSULE BY MOUTH TWICE DAILY AS NEEDED THEN TAKE 3 CAPSULES BY MOUTH AT BEDTIME  . hydroxychloroquine (PLAQUENIL) 200 MG tablet Take 300 mg daily (1 tab and a half ) daily, 90 days  . levothyroxine (SYNTHROID) 25 MCG tablet TAKE 1 TABLET(25 MCG) BY MOUTH DAILY BEFORE BREAKFAST  . lisinopril (ZESTRIL) 20 MG tablet TAKE 1 TABLET(20 MG) BY MOUTH DAILY  . methotrexate 50 MG/2ML injection Take 0.8 ml once a week , 12 weeks  . mupirocin ointment (BACTROBAN) 2 % Apply 1 application topically 2 (two) times daily. For up to 2 weeks.  Marland Kitchen omeprazole (PRILOSEC) 20 MG capsule TAKE 1 CAPSULE(20 MG) BY MOUTH DAILY  . promethazine (PHENERGAN) 12.5 MG tablet TAKE 1 TO 2 TABLETS(12.5 TO 25 MG) BY MOUTH EVERY 8 HOURS AS NEEDED FOR NAUSEA OR VOMITING  . traMADol (ULTRAM) 50 MG tablet Take by mouth.  . traZODone (DESYREL) 50 MG tablet Take 1 tablet (50 mg total) by mouth at bedtime.  . zoledronic acid (RECLAST) 5 MG/100ML SOLN injection Inject 5 mg into the vein once.  Marland Kitchen acetaminophen (TYLENOL) 650 MG CR tablet Take by mouth.  Marland Kitchen alendronate (FOSAMAX) 70 MG tablet TAKE  1 TABLET BY MOUTH ONCE A WEEK, WITH A FULL GLASS OF WATER ON AN EMPTY STOMACH, REMAIN UPRIGHT FOR 30 MINUTES (Patient not taking: No sig reported)  . furosemide (LASIX) 20 MG tablet Take 1 tablet (20 mg total) by mouth daily as needed for fluid or edema. Short term use only 3-5 days as needed at a time. (Patient not taking: No sig reported)  . ibuprofen (ADVIL) 200 MG tablet Take 400 mg by mouth 2 (two) times daily. (Patient not taking: No sig reported)   No  facility-administered encounter medications on file as of 12/20/2020.    Allergies (verified) Penicillin g, Penicillins, Sulfa antibiotics, and Sulfasalazine   History: Past Medical History:  Diagnosis Date  . Allergy   . Arthritis   . Bunion   . Chronic sinusitis   . COPD (chronic obstructive pulmonary disease) ([place])   . High risk medication use   . Hyperlipidemia   . Hypertension   . Hypothyroidism   . Insomnia   . Macular degeneration   . Osteoporosis   . Paroxysmal A-fib ([place])   . Prediabetes   . Rheumatoid arthritis [facility])    Past Surgical History:  Procedure Laterality Date  . APPENDECTOMY    . 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 LEG SURGERY     Rt leg surgery for Compound fracture of the Right tibia    Family History  Problem Relation Age of Onset  . Breast cancer Sister   . Heart disease Brother   . Diabetes Brother   . Osteoarthritis Mother   . Lung disease Father    Social History   Socioeconomic History  . Marital status: Married    Spouse name: Not on file  . Number of children: Not on file  . Years of education: Not on file  . Highest education level: Not on file  Occupational History  . Occupation: retired  Tobacco Use  . Smoking status: Former Smoker    Quit date: 07/19/2014    Years since quitting: 6.4  . Smokeless tobacco: Never Used  Vaping Use  . Vaping Use: Never used  Substance and Sexual Activity  . Alcohol use: Not Currently    Alcohol/week: 14.0 standard drinks    Types: 14 Glasses of wine per week  . Drug use: No  . Sexual activity: Not on file  Other Topics Concern  . Not on file  Social History Narrative  . Not on file   Social Determinants of Health   Financial Resource Strain: Low Risk   . Difficulty of Paying Living Expenses: Not hard at all  Food Insecurity: No Food  Insecurity  . Worried About Charity fundraiser in the Last Year: Never true  . Ran Out of Food in the Last Year: Never true  Transportation Needs: No Transportation Needs  . Lack of Transportation (Medical): No  . Lack of Transportation (Non-Medical): No  Physical Activity: Inactive  . Days of Exercise per Week: 0 days  . Minutes of Exercise per Session: 0 min  Stress: No Stress Concern Present  . Feeling of Stress : Not at all  Social Connections: Not on file    Tobacco Counseling Counseling given: Not Answered   Clinical Intake:  Pre-visit preparation completed: Yes  Pain : 0-10 Pain Score: 8  Pain Type: Neuropathic pain Pain Location:  (neck, back, sides)  Pain Descriptors / Indicators: Burning Pain Onset: More than a month ago Pain Frequency: Constant     Nutritional Status: BMI of 19-24  Normal Nutritional Risks: Nausea/ vomitting/ diarrhea (nausea and diarrhea) Diabetes: No  How often do you need to have someone help you when you read instructions, pamphlets, or other written materials from your doctor or pharmacy?: 1 - Never  Diabetic? no  Interpreter Needed?: No  Information entered by :: NAllen LPN   Activities of Daily Living In your present state of health, do you have any difficulty performing the following activities: 12/20/2020  Hearing? N  Vision? Y  Comment macular degeneration  Difficulty concentrating or making decisions? N  Walking or climbing stairs? N  Dressing or bathing? N  Doing errands, shopping? Y  Comment does not Physiological scientist and eating ? N  Using the Toilet? N  In the past six months, have you accidently leaked urine? Y  Comment incontinence  Do you have problems with loss of bowel control? N  Managing your Medications? N  Managing your Finances? N  Housekeeping or managing your Housekeeping? N  Some recent data might be hidden    Patient Care Team: Olin Hauser, DO as PCP - General (Family  Medicine)  Indicate any recent Medical Services you may have received from other than [facility] providers in the past year (date may be approximate).     Assessment:   This is a routine wellness examination for Alexis Owens.  Hearing/Vision screen No exam data present  Dietary issues and exercise activities discussed: Current Exercise Habits: The patient does not participate in regular exercise at present  Goals    . Patient Stated     12/20/2020, wants to get back to normal      Depression Screen PHQ 2/9 Scores 12/20/2020 01/15/2020 12/18/2019 05/11/2019 09/29/2018 07/19/2017  PHQ - 2 Score 0 0 0 0 0 0  PHQ- 9 Score - - - - - 0    Fall Risk Fall Risk  12/20/2020 01/15/2020 12/18/2019 05/11/2019 12/27/2017  Falls in the past year? 1 0 0 0 Yes  Comment tripped over dog - - - -  Number falls in past yr: 0 0 0 - -  Injury with Fall? 1 0 0 - -  Comment broke shoulder - - - -  Risk for fall due to : Impaired balance/gait;Medication side effect;Impaired mobility - - - Impaired balance/gait  Follow up Falls evaluation completed;Education provided;Falls prevention discussed Falls evaluation completed Falls evaluation completed Falls evaluation completed -    FALL RISK PREVENTION PERTAINING TO THE HOME:  Any stairs in or around the home? No  If so, are there any without handrails? n/a Home free of loose throw rugs in walkways, pet beds, electrical cords, etc? Yes  Adequate lighting in your home to reduce risk of falls? Yes   ASSISTIVE DEVICES UTILIZED TO PREVENT FALLS:  Life alert? No  Use of a cane, walker or w/c? Yes  Grab bars in the bathroom? Yes  Shower chair or bench in shower? Yes  Elevated toilet seat or a handicapped toilet? Yes   TIMED UP AND GO:  Was the test performed? No .    Cognitive Function:     6CIT Screen 12/20/2020  What Year? 0 points  What month? 0 points  What time? 0 points  Count back from 20 2 points  Months in reverse 4 points  Repeat phrase 2 points  Total Score 8  Immunizations Immunization History  Administered Date(s) Administered  . Fluad Quad(high Dose 65+) 12/18/2019, 09/26/2020  . Influenza,inj,Quad PF,6+ Mos 08/09/2018  . Influenza-Unspecified 09/04/2017, 08/09/2018  . PFIZER(Purple Top)SARS-COV-2 Vaccination 02/19/2020, 03/11/2020, 10/11/2020  . Tdap 06/12/2019    TDAP status: Up to date  Flu Vaccine status: Up to date  Pneumococcal vaccine status: Declined,  Education has been provided regarding the importance of this vaccine but patient still declined. Advised may receive this vaccine at local pharmacy or Health Dept. Aware to provide a copy of the vaccination record if obtained from local pharmacy or Health Dept. Verbalized acceptance and understanding.   Covid-19 vaccine status: Completed vaccines  Qualifies for Shingles Vaccine? Yes   Zostavax completed No   Shingrix Completed?: No.    Education has been provided regarding the importance of this vaccine. Patient has been advised to call insurance company to determine out of pocket expense if they have not yet received this vaccine. Advised may also receive vaccine at local pharmacy or Health Dept. Verbalized acceptance and understanding.  Screening Tests Health Maintenance  Topic Date Due  . Hepatitis C Screening  Never done  . PNA vac Low Risk Adult (1 of 2 - PCV13) Never done  . TETANUS/TDAP  06/11/2029  . INFLUENZA VACCINE  Completed  . DEXA SCAN  Completed  . COVID-19 Vaccine  Completed    Health Maintenance  Health Maintenance Due  Topic Date Due  . Hepatitis C Screening  Never done  . PNA vac Low Risk Adult (1 of 2 - PCV13) Never done    Colorectal cancer screening: No longer required.   Mammogram status: No longer required due to age.  Bone Density status: Completed 12/17/2016.   Lung Cancer Screening: (Low Dose CT Chest recommended if Age 39-80 years, 30 pack-year currently smoking OR have quit w/in 15years.) does not qualify.   Lung Cancer Screening  Referral: no  Additional Screening:  Hepatitis C Screening: does qualify; due  Vision Screening: Recommended annual ophthalmology exams for early detection of glaucoma and other disorders of the eye. Is the patient up to date with their annual eye exam?  No  Who is the provider or what is the name of the office in which the patient attends annual eye exams? none If pt is not established with a provider, would they like to be referred to a provider to establish care? No .   Dental Screening: Recommended annual dental exams for proper oral hygiene  Community Resource Referral / Chronic Care Management: CRR required this visit?  No   CCM required this visit?  No      Plan:     I have personally reviewed and noted the following in the patient's chart:   . Medical and social history . Use of alcohol, tobacco or illicit drugs  . Current medications and supplements . Functional ability and status . Nutritional status . Physical activity . Advanced directives . List of other physicians . Hospitalizations, surgeries, and ER visits in previous 12 months . Vitals . Screenings to include cognitive, depression, and falls . Referrals and appointments  In addition, I have reviewed and discussed with patient certain preventive protocols, quality metrics, and best practice recommendations. A written personalized care plan for preventive services as well as general preventive health recommendations were provided to patient.     Kellie Simmering, LPN   06/12/8298   Nurse Notes: Patient states she has shingles

## 2020-12-21 ENCOUNTER — Other Ambulatory Visit: Payer: Medicare Other

## 2020-12-21 ENCOUNTER — Other Ambulatory Visit: Payer: Self-pay

## 2020-12-21 DIAGNOSIS — I10 Essential (primary) hypertension: Secondary | ICD-10-CM | POA: Diagnosis not present

## 2020-12-21 DIAGNOSIS — R7303 Prediabetes: Secondary | ICD-10-CM

## 2020-12-21 DIAGNOSIS — E782 Mixed hyperlipidemia: Secondary | ICD-10-CM

## 2020-12-21 DIAGNOSIS — E039 Hypothyroidism, unspecified: Secondary | ICD-10-CM

## 2020-12-22 LAB — COMPREHENSIVE METABOLIC PANEL
AG Ratio: 1.3 (calc) (ref 1.0–2.5)
ALT: 8 U/L (ref 6–29)
AST: 12 U/L (ref 10–35)
Albumin: 3.9 g/dL (ref 3.6–5.1)
Alkaline phosphatase (APISO): 87 U/L (ref 37–153)
BUN/Creatinine Ratio: 17 (calc) (ref 6–22)
BUN: 17 mg/dL (ref 7–25)
CO2: 24 mmol/L (ref 20–32)
Calcium: 9 mg/dL (ref 8.6–10.4)
Chloride: 103 mmol/L (ref 98–110)
Creat: 0.99 mg/dL — ABNORMAL HIGH (ref 0.60–0.93)
Globulin: 3.1 g/dL (calc) (ref 1.9–3.7)
Glucose, Bld: 106 mg/dL — ABNORMAL HIGH (ref 65–99)
Potassium: 4.6 mmol/L (ref 3.5–5.3)
Sodium: 135 mmol/L (ref 135–146)
Total Bilirubin: 0.5 mg/dL (ref 0.2–1.2)
Total Protein: 7 g/dL (ref 6.1–8.1)

## 2020-12-22 LAB — CBC WITH DIFFERENTIAL/PLATELET
Absolute Monocytes: 274 cells/uL (ref 200–950)
Basophils Absolute: 100 cells/uL (ref 0–200)
Basophils Relative: 2.7 %
Eosinophils Absolute: 30 cells/uL (ref 15–500)
Eosinophils Relative: 0.8 %
HCT: 24.2 % — ABNORMAL LOW (ref 35.0–45.0)
Hemoglobin: 7.9 g/dL — ABNORMAL LOW (ref 11.7–15.5)
Lymphs Abs: 555 cells/uL — ABNORMAL LOW (ref 850–3900)
MCH: 32 pg (ref 27.0–33.0)
MCHC: 32.6 g/dL (ref 32.0–36.0)
MCV: 98 fL (ref 80.0–100.0)
MPV: 11.5 fL (ref 7.5–12.5)
Monocytes Relative: 7.4 %
Neutro Abs: 2742 cells/uL (ref 1500–7800)
Neutrophils Relative %: 74.1 %
Platelets: 152 10*3/uL (ref 140–400)
RBC: 2.47 10*6/uL — ABNORMAL LOW (ref 3.80–5.10)
RDW: 15.6 % — ABNORMAL HIGH (ref 11.0–15.0)
Total Lymphocyte: 15 %
WBC: 3.7 10*3/uL — ABNORMAL LOW (ref 3.8–10.8)

## 2020-12-22 LAB — LIPID PANEL
Cholesterol: 138 mg/dL (ref <200)
HDL: 46 mg/dL — ABNORMAL LOW (ref >=50)
LDL Cholesterol (Calc): 71 mg/dL (calc)
Non-HDL Cholesterol (Calc): 92 mg/dL (calc) (ref <130)
Total CHOL/HDL Ratio: 3 (calc) (ref <5.0)
Triglycerides: 127 mg/dL (ref <150)

## 2020-12-22 LAB — HEMOGLOBIN A1C
Hgb A1c MFr Bld: 5.7 % of total Hgb — ABNORMAL HIGH (ref <5.7)
Mean Plasma Glucose: 117 mg/dL
eAG (mmol/L): 6.5 mmol/L

## 2020-12-22 LAB — TSH: TSH: 0.75 mIU/L (ref 0.40–4.50)

## 2020-12-23 ENCOUNTER — Telehealth: Payer: Self-pay | Admitting: *Deleted

## 2020-12-23 ENCOUNTER — Other Ambulatory Visit: Payer: Self-pay

## 2020-12-23 DIAGNOSIS — M159 Polyosteoarthritis, unspecified: Secondary | ICD-10-CM | POA: Diagnosis not present

## 2020-12-23 DIAGNOSIS — M792 Neuralgia and neuritis, unspecified: Secondary | ICD-10-CM | POA: Diagnosis not present

## 2020-12-23 DIAGNOSIS — R208 Other disturbances of skin sensation: Secondary | ICD-10-CM | POA: Diagnosis not present

## 2020-12-23 DIAGNOSIS — B029 Zoster without complications: Secondary | ICD-10-CM | POA: Diagnosis not present

## 2020-12-23 DIAGNOSIS — D472 Monoclonal gammopathy: Secondary | ICD-10-CM

## 2020-12-23 DIAGNOSIS — M059 Rheumatoid arthritis with rheumatoid factor, unspecified: Secondary | ICD-10-CM | POA: Diagnosis not present

## 2020-12-23 DIAGNOSIS — D649 Anemia, unspecified: Secondary | ICD-10-CM | POA: Diagnosis not present

## 2020-12-23 DIAGNOSIS — Z79899 Other long term (current) drug therapy: Secondary | ICD-10-CM | POA: Diagnosis not present

## 2020-12-23 NOTE — Telephone Encounter (Signed)
Done...  I called pt and made her aware that she needed to RTC for labs and to see MD 1 week later Per pt request to have her labs drawn on 12/30/20 and see Dr.Yu on 01/06/21 Appt sched

## 2020-12-23 NOTE — Telephone Encounter (Signed)
Dr Serita Grit office called reporting that patient HGB on 12/21/20 was 7.9 and they are requesting that we call patient about this [phone]  CBC with Differential/Platelet Order: [id number]  Status: Final result   Visible to patient: Yes (seen)   Next appt: 12/29/2020 at 09:40 AM in Internal Medicine Nobie Putnam, DO)   Dx: Mixed hyperlipidemia; Essential hyper...   0 Result Notes  Component Ref Range & Units 2 d ago  (12/21/20) 7 mo ago  (05/11/20) 8 mo ago  (04/15/20) 11 mo ago  (01/14/20) 1 yr ago  (12/18/19) 3 yr ago  (12/20/17) 3 yr ago  (12/19/17)  WBC 3.8 - 10.8 Thousand/uL 3.7Low  7.1 R  6.1 R  6.2 R  5.7  8.8 R  8.1 R   RBC 3.80 - 5.10 Million/uL 2.47Low  3.69Low R  3.69Low R  3.96 R  3.88  3.20Low R  3.24Low R   Hemoglobin 11.7 - 15.5 g/dL 7.9Low  12.5 R  12.4 R  13.0 R  12.7  11.1Low R  11.3Low R   HCT 35.0 - 45.0 % 24.2Low  36.1 R  36.6 R  40.3 R  37.7  32.6Low R  33.1Low

## 2020-12-23 NOTE — Telephone Encounter (Signed)
Per appt cancellation note: pt called back in Nov 2021 to cancel appts and would call back to r/s. Please call pt to reschedule per her availability.  Lab/MD with labs 1 weeks prior

## 2020-12-23 NOTE — Progress Notes (Signed)
Error

## 2020-12-23 NOTE — Telephone Encounter (Signed)
Per Dr Tasia Catchings, I need to check multiple myeloma panel, iron tibc ferritin, b12, folate, retic panel, tech smear, NT BNP, see me 1 week after

## 2020-12-29 ENCOUNTER — Other Ambulatory Visit: Payer: Self-pay

## 2020-12-29 ENCOUNTER — Encounter: Payer: Self-pay | Admitting: Family Medicine

## 2020-12-29 ENCOUNTER — Ambulatory Visit (INDEPENDENT_AMBULATORY_CARE_PROVIDER_SITE_OTHER): Payer: Medicare Other | Admitting: Family Medicine

## 2020-12-29 VITALS — BP 92/44 | HR 77 | Ht 62.0 in | Wt 120.0 lb

## 2020-12-29 DIAGNOSIS — R350 Frequency of micturition: Secondary | ICD-10-CM

## 2020-12-29 DIAGNOSIS — D509 Iron deficiency anemia, unspecified: Secondary | ICD-10-CM

## 2020-12-29 DIAGNOSIS — M792 Neuralgia and neuritis, unspecified: Secondary | ICD-10-CM | POA: Diagnosis not present

## 2020-12-29 DIAGNOSIS — N3001 Acute cystitis with hematuria: Secondary | ICD-10-CM

## 2020-12-29 DIAGNOSIS — D472 Monoclonal gammopathy: Secondary | ICD-10-CM | POA: Diagnosis not present

## 2020-12-29 LAB — POCT URINALYSIS DIPSTICK
Bilirubin, UA: NEGATIVE
Glucose, UA: NEGATIVE
Ketones, UA: NEGATIVE
Nitrite, UA: NEGATIVE
Protein, UA: POSITIVE — AB
Spec Grav, UA: 1.01 (ref 1.010–1.025)
Urobilinogen, UA: 0.2 E.U./dL
pH, UA: 7 (ref 5.0–8.0)

## 2020-12-29 MED ORDER — GABAPENTIN 100 MG PO CAPS: ORAL_CAPSULE | ORAL | 1 refills | Status: DC

## 2020-12-29 MED ORDER — CIPROFLOXACIN HCL 500 MG PO TABS: 500.0000 mg | ORAL_TABLET | Freq: Two times a day (BID) | ORAL | 0 refills | Status: DC

## 2020-12-29 NOTE — Progress Notes (Signed)
Subjective:    Patient ID: Alexis Owens, female    DOB: 08/08/1941, 80 y.o.   MRN: [id number]  Alexis Owens is a 80 y.o. female presenting on 12/29/2020 for Herpes Zoster (Shingles on both sides of her abdomen... Seems to be drying out. ), Urinary Tract Infection (Pressure, dark urine, and odor), and Fatigue   HPI   UTI Recent urinary discomfort, abdominal discomfort, blood in urine, urinary frequency odor  Shingles Chronic Neuropathic pain For few weeks, almost gone. Was untreated Some itching. Still residual rash resolving now Taking gabapentin 163m 1 am / 1 afternoon / 3 at night  Rheumatoid Arthritis Rheumatology She was taken off MTX due to abnormal blood.  Anemia, normocytic MGUS Acute worsening with anemia on recent labs Upcoming Hematology apt now given severity concern for MGUS awaiting further blood work up for possible multiple myeloma Cannot take oral iron due to GI intolerance.  Her son has come to help provide care for her. He will need FMLA paperwork to take time off work.   [facility] 12/20/2020 01/15/2020 12/18/2019  Decreased Interest 0 0 0  Down, Depressed, Hopeless 0 0 0  PHQ - 2 Score 0 0 0  Altered sleeping - - -  Tired, decreased energy - - -  Change in appetite - - -  Feeling bad or failure about yourself  - - -  Trouble concentrating - - -  Moving slowly or fidgety/restless - - -  Suicidal thoughts - - -  PHQ-9 Score - - -    Past Medical History:  Diagnosis Date  . Allergy   . Arthritis   . Bunion   . Chronic sinusitis   . COPD (chronic obstructive pulmonary disease) ([place]   . High risk medication use   . Hyperlipidemia   . Hypertension   . Hypothyroidism   . Insomnia   . Macular degeneration   . Osteoporosis   . Paroxysmal A-fib ([place]   . Prediabetes   . Rheumatoid arthritis ([facility]    Past Surgical History:  Procedure Laterality Date  . APPENDECTOMY    . HIP ARTHROPLASTY Left 12/18/2017   Procedure:  ARTHROPLASTY BIPOLAR HIP (HEMIARTHROPLASTY);  Surgeon: PCorky Mull MD;  Location: [facility] ORS;  Service: Orthopedics;  Laterality: Left;  . LEG SURGERY     Rt leg surgery for Compound fracture of the Right tibia    Social History   Socioeconomic History  . Marital status: Married    Spouse name: Not on file  . Number of children: Not on file  . Years of education: Not on file  . Highest education level: Not on file  Occupational History  . Occupation: retired  Tobacco Use  . Smoking status: Former Smoker    Quit date: 07/19/2014    Years since quitting: 6.4  . Smokeless tobacco: Never Used  Vaping Use  . Vaping Use: Never used  Substance and Sexual Activity  . Alcohol use: Not Currently    Alcohol/week: 14.0 standard drinks    Types: 14 Glasses of wine per week  . Drug use: No  . Sexual activity: Not on file  Other Topics Concern  . Not on file  Social History Narrative  . Not on file   Social Determinants of Health   Financial Resource Strain: Low Risk   . Difficulty of Paying Living Expenses: Not hard at all  Food Insecurity: No Food Insecurity  . Worried About RCharity fundraiserin the Last Year:  Never true  . Ran Out of Food in the Last Year: Never true  Transportation Needs: No Transportation Needs  . Lack of Transportation (Medical): No  . Lack of Transportation (Non-Medical): No  Physical Activity: Inactive  . Days of Exercise per Week: 0 days  . Minutes of Exercise per Session: 0 min  Stress: No Stress Concern Present  . Feeling of Stress : Not at all  Social Connections: Not on file  Intimate Partner Violence: Not on file   Family History  Problem Relation Age of Onset  . Breast cancer Sister   . Heart disease Brother   . Diabetes Brother   . Osteoarthritis Mother   . Lung disease Father    Current Outpatient Medications on File Prior to Visit  Medication Sig  . acetaminophen (TYLENOL) 650 MG CR tablet Take by mouth.  Marland Kitchen albuterol (VENTOLIN HFA) 108  (90 Base) MCG/ACT inhaler INHALE 1 TO 2 PUFFS INTO THE LUNGS EVERY 6 HOURS AS NEEDED FOR WHEEZING OR SHORTNESS OF BREATH  . alendronate (FOSAMAX) 70 MG tablet TAKE 1 TABLET BY MOUTH ONCE A WEEK, WITH A FULL GLASS OF WATER ON AN EMPTY STOMACH, REMAIN UPRIGHT FOR 30 MINUTES (Patient not taking: No sig reported)  . diltiazem (DILACOR XR) 180 MG 24 hr capsule Take 1 capsule (180 mg total) by mouth daily.  . fluticasone (FLONASE) 50 MCG/ACT nasal spray SHAKE LIQUID AND USE 2 SPRAYS IN EACH NOSTRIL DAILY  . folic acid (FOLVITE) 1 MG tablet Take 1 mg by mouth daily.  . furosemide (LASIX) 20 MG tablet Take 1 tablet (20 mg total) by mouth daily as needed for fluid or edema. Short term use only 3-5 days as needed at a time. (Patient not taking: No sig reported)  . hydroxychloroquine (PLAQUENIL) 200 MG tablet Take 300 mg daily (1 tab and a half ) daily, 90 days  . ibuprofen (ADVIL) 200 MG tablet Take 400 mg by mouth 2 (two) times daily. (Patient not taking: No sig reported)  . levothyroxine (SYNTHROID) 25 MCG tablet TAKE 1 TABLET(25 MCG) BY MOUTH DAILY BEFORE BREAKFAST  . lisinopril (ZESTRIL) 20 MG tablet TAKE 1 TABLET(20 MG) BY MOUTH DAILY  . mupirocin ointment (BACTROBAN) 2 % Apply 1 application topically 2 (two) times daily. For up to 2 weeks.  Marland Kitchen omeprazole (PRILOSEC) 20 MG capsule TAKE 1 CAPSULE(20 MG) BY MOUTH DAILY  . promethazine (PHENERGAN) 12.5 MG tablet TAKE 1 TO 2 TABLETS(12.5 TO 25 MG) BY MOUTH EVERY 8 HOURS AS NEEDED FOR NAUSEA OR VOMITING  . traMADol (ULTRAM) 50 MG tablet Take by mouth.  . traZODone (DESYREL) 50 MG tablet Take 1 tablet (50 mg total) by mouth at bedtime.  . zoledronic acid (RECLAST) 5 MG/100ML SOLN injection Inject 5 mg into the vein once.   No current facility-administered medications on file prior to visit.    Review of Systems Per HPI unless specifically indicated above      Objective:    BP (!) 92/44   Pulse 77   Ht _0  (1.575 m)   Wt 120 lb (54.4 kg)   SpO2  100%   BMI 21.95 kg/m   Wt Readings from Last 3 Encounters:  12/29/20 120 lb (54.4 kg)  12/20/20 120 lb (54.4 kg)  08/05/20 117 lb (53.1 kg)    Physical Exam Vitals and nursing note reviewed.  Constitutional:      General: She is not in acute distress.    Appearance: She is well-developed and well-nourished. She  is not diaphoretic.     Comments: Currently ill appearing 80 yr female, comfortable, cooperative  HENT:     Head: Normocephalic and atraumatic.     Mouth/Throat:     Mouth: Oropharynx is clear and moist.  Eyes:     General:        Right eye: No discharge.        Left eye: No discharge.     Conjunctiva/sclera: Conjunctivae normal.  Neck:     Thyroid: No thyromegaly.  Cardiovascular:     Rate and Rhythm: Normal rate and regular rhythm.     Pulses: Intact distal pulses.     Heart sounds: Normal heart sounds. No murmur heard.   Pulmonary:     Effort: Pulmonary effort is normal. No respiratory distress.     Breath sounds: Normal breath sounds. No wheezing or rales.  Musculoskeletal:        General: No edema. Normal range of motion.     Cervical back: Normal range of motion and neck supple.     Comments: In wheelchair  Lymphadenopathy:     Cervical: No cervical adenopathy.  Skin:    General: Skin is warm and dry.     Findings: Rash (residual R sided flank abdominal rash scattered healing scabs from vesicular rash) present. No erythema.  Neurological:     Mental Status: She is alert and oriented to person, place, and time.  Psychiatric:        Mood and Affect: Mood and affect normal.        Behavior: Behavior normal.     Comments: Well groomed, good eye contact, normal speech and thoughts       Results for orders placed or performed in visit on 12/29/20  POCT Urinalysis Dipstick  Result Value Ref Range   Color, UA Amber    Clarity, UA Cloudy    Glucose, UA Negative Negative   Bilirubin, UA Negative    Ketones, UA Negative    Spec Grav, UA 1.010 1.010 -  1.025   Blood, UA Large 3+    pH, UA 7.0 5.0 - 8.0   Protein, UA Positive (A) Negative   Urobilinogen, UA 0.2 0.2 or 1.0 E.U./dL   Nitrite, UA Negative    Leukocytes, UA Large (3+) (A) Negative   Appearance     Odor Foul       Assessment & Plan:   Problem List Items Addressed This Visit    Neuropathic pain   Relevant Medications   gabapentin (NEURONTIN) 100 MG capsule    Other Visit Diagnoses    Acute cystitis with hematuria    -  Primary   Relevant Medications   ciprofloxacin (CIPRO) 500 MG tablet   Urinary frequency       Relevant Medications   ciprofloxacin (CIPRO) 500 MG tablet   Other Relevant Orders   POCT Urinalysis Dipstick (Completed)   Urine Culture   MGUS (monoclonal gammopathy of unknown significance)       Iron deficiency anemia, unspecified iron deficiency anemia type          #UTI, hematuria Concern with constellation of symptoms, now with UTI in setting of other illness with abnormal UA dipstick Send Urine culture today Start Cipro 500 BID x 5 days  #MGUS / Anemia Recent evaluations by Rheumatology and has been DC'd on MTX and has had blood work, also patient had routine physical labs from our office with significant abnormal CBC already scheduled to see Hematology/Oncology. - Clinically  concerning anemia and overall patient appears to be notably more ill than her baseline - She cannot tolerate PO Iron - Advised concerns with anemia and her symptomatology Recommend f/u ASAP with Heme/Onc and may warrant further management IV iron and other therapy Return criteria given if acute worsening or new concerns.  #History of shingles Already on Gabapentin for neuropathic pain it has helped the possible shingles, now rash nearly resolved onset weeks ago. Refill Gabapentin today. Considered Valtrex, hold for now.  Meds ordered this encounter  Medications  . ciprofloxacin (CIPRO) 500 MG tablet    Sig: Take 1 tablet (500 mg total) by mouth 2 (two) times  daily. For 5 days    Dispense:  10 tablet    Refill:  0  . gabapentin (NEURONTIN) 100 MG capsule    Sig: TAKE 1 CAPSULE IN MORNING AND 1 IN AFTERNOON AND 3 AT BEDTIME    Dispense:  450 capsule    Refill:  1      Follow up plan: Return if symptoms worsen or fail to improve.  Nobie Putnam, [facility] 12/29/2020, 10:04 AM

## 2020-12-29 NOTE — Patient Instructions (Addendum)
Thank you for coming to the office today.  Cipro antibiotic for urinary tract infection. Stay tuned for urine culture result.  Shingles - take gabapentin, refilled today  Will stay tuned for Hematology - will need to treat the anemia.   Please schedule a Follow-up Appointment to: Return if symptoms worsen or fail to improve.  If you have any other questions or concerns, please feel free to call the office or send a message through [place]. You may also schedule an earlier appointment if necessary.  Additionally, you may be receiving a survey about your experience at our office within a few days to 1 week by e-mail or mail. We value your feedback.  Nobie Putnam, DO [place]

## 2020-12-30 ENCOUNTER — Inpatient Hospital Stay: Payer: Medicare Other | Attending: Oncology

## 2020-12-30 DIAGNOSIS — D649 Anemia, unspecified: Secondary | ICD-10-CM | POA: Diagnosis not present

## 2020-12-30 DIAGNOSIS — M069 Rheumatoid arthritis, unspecified: Secondary | ICD-10-CM | POA: Insufficient documentation

## 2020-12-30 DIAGNOSIS — K5900 Constipation, unspecified: Secondary | ICD-10-CM | POA: Diagnosis not present

## 2020-12-30 DIAGNOSIS — I7300 Raynaud's syndrome without gangrene: Secondary | ICD-10-CM | POA: Insufficient documentation

## 2020-12-30 DIAGNOSIS — R634 Abnormal weight loss: Secondary | ICD-10-CM | POA: Insufficient documentation

## 2020-12-30 DIAGNOSIS — D472 Monoclonal gammopathy: Secondary | ICD-10-CM | POA: Diagnosis not present

## 2020-12-30 LAB — CBC WITH DIFFERENTIAL/PLATELET
Abs Immature Granulocytes: 0.1 10*3/uL — ABNORMAL HIGH (ref 0.00–0.07)
Basophils Absolute: 0.1 10*3/uL (ref 0.0–0.1)
Basophils Relative: 1 %
Eosinophils Absolute: 0.1 10*3/uL (ref 0.0–0.5)
Eosinophils Relative: 3 %
HCT: 22 % — ABNORMAL LOW (ref 36.0–46.0)
Hemoglobin: 6.9 g/dL — ABNORMAL LOW (ref 12.0–15.0)
Immature Granulocytes: 2 %
Lymphocytes Relative: 25 %
Lymphs Abs: 1.1 10*3/uL (ref 0.7–4.0)
MCH: 31.5 pg (ref 26.0–34.0)
MCHC: 31.4 g/dL (ref 30.0–36.0)
MCV: 100.5 fL — ABNORMAL HIGH (ref 80.0–100.0)
Monocytes Absolute: 1.5 10*3/uL — ABNORMAL HIGH (ref 0.1–1.0)
Monocytes Relative: 32 %
Neutro Abs: 1.7 10*3/uL (ref 1.7–7.7)
Neutrophils Relative %: 37 %
Platelets: 192 10*3/uL (ref 150–400)
RBC: 2.19 MIL/uL — ABNORMAL LOW (ref 3.87–5.11)
RDW: 17.6 % — ABNORMAL HIGH (ref 11.5–15.5)
Smear Review: NORMAL
WBC: 4.5 10*3/uL (ref 4.0–10.5)
nRBC: 0 % (ref 0.0–0.2)

## 2020-12-30 LAB — FOLATE: Folate: 12.8 ng/mL (ref >5.9)

## 2020-12-30 LAB — VITAMIN B12: Vitamin B-12: 682 pg/mL (ref 180–914)

## 2020-12-30 LAB — IRON AND TIBC
Iron: 21 ug/dL — ABNORMAL LOW (ref 28–170)
Saturation Ratios: 8 % — ABNORMAL LOW (ref 10.4–31.8)
TIBC: 276 ug/dL (ref 250–450)
UIBC: 255 ug/dL

## 2020-12-30 LAB — RETIC PANEL
Immature Retic Fract: 31 % — ABNORMAL HIGH (ref 2.3–15.9)
RBC.: 2.2 MIL/uL — ABNORMAL LOW (ref 3.87–5.11)
Retic Count, Absolute: 73.7 10*3/uL (ref 19.0–186.0)
Retic Ct Pct: 3.4 % — ABNORMAL HIGH (ref 0.4–3.1)
Reticulocyte Hemoglobin: 30.6 pg (ref >27.9)

## 2020-12-30 LAB — TECHNOLOGIST SMEAR REVIEW: Plt Morphology: ADEQUATE

## 2020-12-30 LAB — FERRITIN: Ferritin: 473 ng/mL — ABNORMAL HIGH (ref 11–307)

## 2020-12-31 LAB — URINE CULTURE
MICRO NUMBER:: 11547365
SPECIMEN QUALITY:: ADEQUATE

## 2020-12-31 LAB — MISC LABCORP TEST (SEND OUT): Labcorp test code: 143000

## 2021-01-02 LAB — MULTIPLE MYELOMA PANEL, SERUM
Albumin SerPl Elph-Mcnc: 3.3 g/dL (ref 2.9–4.4)
Albumin/Glob SerPl: 1.1 (ref 0.7–1.7)
Alpha 1: 0.4 g/dL (ref 0.0–0.4)
Alpha2 Glob SerPl Elph-Mcnc: 0.8 g/dL (ref 0.4–1.0)
B-Globulin SerPl Elph-Mcnc: 0.9 g/dL (ref 0.7–1.3)
Gamma Glob SerPl Elph-Mcnc: 1.3 g/dL (ref 0.4–1.8)
Globulin, Total: 3.3 g/dL (ref 2.2–3.9)
IgA: 169 mg/dL (ref 64–422)
IgG (Immunoglobin G), Serum: 1083 mg/dL (ref 586–1602)
IgM (Immunoglobulin M), Srm: 248 mg/dL — ABNORMAL HIGH (ref 26–217)
M Protein SerPl Elph-Mcnc: 0.5 g/dL — ABNORMAL HIGH
Total Protein ELP: 6.6 g/dL (ref 6.0–8.5)

## 2021-01-03 ENCOUNTER — Inpatient Hospital Stay: Payer: Medicare Other

## 2021-01-03 ENCOUNTER — Encounter: Payer: Self-pay | Admitting: Oncology

## 2021-01-03 ENCOUNTER — Telehealth: Payer: Self-pay

## 2021-01-03 ENCOUNTER — Inpatient Hospital Stay: Payer: Medicare Other | Admitting: Oncology

## 2021-01-03 ENCOUNTER — Other Ambulatory Visit: Payer: Self-pay

## 2021-01-03 ENCOUNTER — Other Ambulatory Visit: Payer: Self-pay | Admitting: Oncology

## 2021-01-03 VITALS — BP 87/55 | HR 76 | Temp 98.1°F | Wt 99.5 lb

## 2021-01-03 DIAGNOSIS — R634 Abnormal weight loss: Secondary | ICD-10-CM

## 2021-01-03 DIAGNOSIS — D649 Anemia, unspecified: Secondary | ICD-10-CM | POA: Diagnosis not present

## 2021-01-03 DIAGNOSIS — K5900 Constipation, unspecified: Secondary | ICD-10-CM | POA: Diagnosis not present

## 2021-01-03 DIAGNOSIS — D472 Monoclonal gammopathy: Secondary | ICD-10-CM

## 2021-01-03 DIAGNOSIS — I7300 Raynaud's syndrome without gangrene: Secondary | ICD-10-CM | POA: Diagnosis not present

## 2021-01-03 DIAGNOSIS — M069 Rheumatoid arthritis, unspecified: Secondary | ICD-10-CM | POA: Diagnosis not present

## 2021-01-03 DIAGNOSIS — Z87891 Personal history of nicotine dependence: Secondary | ICD-10-CM

## 2021-01-03 LAB — CBC WITH DIFFERENTIAL/PLATELET
Abs Immature Granulocytes: 1.24 10*3/uL — ABNORMAL HIGH (ref 0.00–0.07)
Basophils Absolute: 0.2 10*3/uL — ABNORMAL HIGH (ref 0.0–0.1)
Basophils Relative: 1 %
Eosinophils Absolute: 0.3 10*3/uL (ref 0.0–0.5)
Eosinophils Relative: 2 %
HCT: 25.7 % — ABNORMAL LOW (ref 36.0–46.0)
Hemoglobin: 7.8 g/dL — ABNORMAL LOW (ref 12.0–15.0)
Immature Granulocytes: 11 %
Lymphocytes Relative: 29 %
Lymphs Abs: 3.4 10*3/uL (ref 0.7–4.0)
MCH: 31.5 pg (ref 26.0–34.0)
MCHC: 30.4 g/dL (ref 30.0–36.0)
MCV: 103.6 fL — ABNORMAL HIGH (ref 80.0–100.0)
Monocytes Absolute: 2.3 10*3/uL — ABNORMAL HIGH (ref 0.1–1.0)
Monocytes Relative: 19 %
Neutro Abs: 4.4 10*3/uL (ref 1.7–7.7)
Neutrophils Relative %: 38 %
Platelets: 433 10*3/uL — ABNORMAL HIGH (ref 150–400)
RBC: 2.48 MIL/uL — ABNORMAL LOW (ref 3.87–5.11)
RDW: 18.8 % — ABNORMAL HIGH (ref 11.5–15.5)
Smear Review: NORMAL
WBC: 11.8 10*3/uL — ABNORMAL HIGH (ref 4.0–10.5)
nRBC: 0 % (ref 0.0–0.2)

## 2021-01-03 LAB — PREPARE RBC (CROSSMATCH)

## 2021-01-03 LAB — ABO/RH: ABO/RH(D): B POS

## 2021-01-03 NOTE — Telephone Encounter (Signed)
Pt scheduled for Bone marrow biopsy on Monday 2/28 @ 8:30 with arrival time of 7:30a. Please schedule MD follow up approx 1 week after biopsy. I will call pt with appts.

## 2021-01-03 NOTE — Progress Notes (Signed)
Hematology/Oncology follow up note [facility] Telephone:([phone]) [phone] Fax:(336) [phone]   Patient Care Team: Olin Hauser, DO as PCP - General (Family Medicine)  REFERRING PROVIDER: Nobie Putnam *  CHIEF COMPLAINTS/REASON FOR VISIT:  Follow-up for IgG lambda MGUS  HISTORY OF PRESENTING ILLNESS:   Alexis Owens is a  80 y.o.  female with PMH listed below was seen in consultation at the request of  Nobie Putnam *  for evaluation of monoclonal gammopathy  Patient sees rheumatology Dr. Meda Coffee for rheumatoid arthritis.  Patient has positive CCP, arthritis was diagnosed in 2016.  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  Her work-up showed abnormal serum protein electrophoresis, IgG monoclonal lambda light chain.  Patient was referred to cancer center for further evaluation. Chronic joint pain. She complains feeling nauseated for the past 1 months.  Some diarrhea as well.  INTERVAL HISTORY Alexis Owens is a 80 y.o. female who has above history reviewed by me today presents for follow up visit for management of MGUS. Problems and complaints are listed below: Patient was last seen by me on 04/29/2020, she lost follow-up. She was seen by primary care provider recently on 12/29/2020 and was found to have worsening of anemia and decline of functional status.  Patient was recommended to reestablish care with me. Patient was accompanied by her husband. She reports profound weakness and tiredness.  Constipation.  Lack of appetite, feel nausea with the thought of eating anything. Rheumatoid arthritis, off methotrexate Also reports numbness and tingling of her fingertips.  Review of Systems  Constitutional: Positive for appetite change, fatigue and unexpected weight change. Negative for chills and fever.  HENT:   Negative for hearing loss and voice change.   Eyes: Negative for eye problems.  Respiratory: Negative for chest tightness and  cough.   Cardiovascular: Negative for chest pain.  Gastrointestinal: Positive for constipation and nausea. Negative for abdominal distention and abdominal pain.  Endocrine: Negative for hot flashes.  Genitourinary: Negative for difficulty urinating and frequency.   Musculoskeletal: Positive for arthralgias.  Skin: Negative for itching and rash.  Neurological: Positive for numbness. Negative for extremity weakness.  Hematological: Negative for adenopathy.  Psychiatric/Behavioral: Negative for confusion.    MEDICAL HISTORY:  Past Medical History:  Diagnosis Date  . Allergy   . Arthritis   . Bunion   . Chronic sinusitis   . COPD (chronic obstructive pulmonary disease) ([place])   . High risk medication use   . Hyperlipidemia   . Hypertension   . Hypothyroidism   . Insomnia   . Macular degeneration   . Osteoporosis   . Paroxysmal A-fib ([place])   . Prediabetes   . Rheumatoid arthritis ([place])     SURGICAL HISTORY: Past Surgical History:  Procedure Laterality Date  . APPENDECTOMY    . 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 LEG SURGERY     Rt leg surgery for Compound fracture of the Right tibia     SOCIAL HISTORY: Social History   Socioeconomic History  . Marital status: Married    Spouse name: Not on file  . Number of children: Not on file  . Years of education: Not on file  . Highest education level: Not on file  Occupational History  . Occupation: retired  Tobacco Use  . Smoking status: Former Smoker    Quit date: 07/19/2014    Years  since quitting: 6.4  . Smokeless tobacco: Never Used  Vaping Use  . Vaping Use: Never used  Substance and Sexual Activity  . Alcohol use: Not Currently    Alcohol/week: 14.0 standard drinks    Types: 14 Glasses of wine per week  . Drug use: No  . Sexual activity: Not on file  Other Topics  Concern  . Not on file  Social History Narrative  . Not on file   Social Determinants of Health   Financial Resource Strain: Low Risk   . Difficulty of Paying Living Expenses: Not hard at all  Food Insecurity: No Food Insecurity  . Worried About Charity fundraiser in the Last Year: Never true  . Ran Out of Food in the Last Year: Never true  Transportation Needs: No Transportation Needs  . Lack of Transportation (Medical): No  . Lack of Transportation (Non-Medical): No  Physical Activity: Inactive  . Days of Exercise per Week: 0 days  . Minutes of Exercise per Session: 0 min  Stress: No Stress Concern Present  . Feeling of Stress : Not at all  Social Connections: Not on file  Intimate Partner Violence: Not on file    FAMILY HISTORY: Family History  Problem Relation Age of Onset  . Breast cancer Sister   . Heart disease Brother   . Diabetes Brother   . Osteoarthritis Mother   . Lung disease Father     ALLERGIES:  is allergic to penicillin g, penicillins, sulfa antibiotics, and sulfasalazine.  MEDICATIONS:  Current Outpatient Medications  Medication Sig Dispense Refill  . acetaminophen (TYLENOL) 650 MG CR tablet Take by mouth.    Marland Kitchen albuterol (VENTOLIN HFA) 108 (90 Base) MCG/ACT inhaler INHALE 1 TO 2 PUFFS INTO THE LUNGS EVERY 6 HOURS AS NEEDED FOR WHEEZING OR SHORTNESS OF BREATH 8.5 g 2  . ciprofloxacin (CIPRO) 500 MG tablet Take 1 tablet (500 mg total) by mouth 2 (two) times daily. For 5 days 10 tablet 0  . diltiazem (DILACOR XR) 180 MG 24 hr capsule Take 1 capsule (180 mg total) by mouth daily. 90 capsule 1  . fluticasone (FLONASE) 50 MCG/ACT nasal spray SHAKE LIQUID AND USE 2 SPRAYS IN EACH NOSTRIL DAILY 48 g 0  . folic acid (FOLVITE) 1 MG tablet Take 1 mg by mouth daily.    . furosemide (LASIX) 20 MG tablet Take 1 tablet (20 mg total) by mouth daily as needed for fluid or edema. Short term use only 3-5 days as needed at a time. 30 tablet 2  . gabapentin (NEURONTIN) 100  MG capsule TAKE 1 CAPSULE IN MORNING AND 1 IN AFTERNOON AND 3 AT BEDTIME 450 capsule 1  . hydroxychloroquine (PLAQUENIL) 200 MG tablet Take 300 mg daily (1 tab and a half ) daily, 90 days    . levothyroxine (SYNTHROID) 25 MCG tablet TAKE 1 TABLET(25 MCG) BY MOUTH DAILY BEFORE BREAKFAST 90 tablet 0  . lisinopril (ZESTRIL) 20 MG tablet TAKE 1 TABLET(20 MG) BY MOUTH DAILY 90 tablet 0  . mupirocin ointment (BACTROBAN) 2 % Apply 1 application topically 2 (two) times daily. For up to 2 weeks. 30 g 1  . omeprazole (PRILOSEC) 20 MG capsule TAKE 1 CAPSULE(20 MG) BY MOUTH DAILY 90 capsule 3  . promethazine (PHENERGAN) 12.5 MG tablet TAKE 1 TO 2 TABLETS(12.5 TO 25 MG) BY MOUTH EVERY 8 HOURS AS NEEDED FOR NAUSEA OR VOMITING 30 tablet 2  . traMADol (ULTRAM) 50 MG tablet Take by mouth.    Marland Kitchen  traZODone (DESYREL) 50 MG tablet Take 1 tablet (50 mg total) by mouth at bedtime. 90 tablet 1  . zoledronic acid (RECLAST) 5 MG/100ML SOLN injection Inject 5 mg into the vein once.    Marland Kitchen alendronate (FOSAMAX) 70 MG tablet TAKE 1 TABLET BY MOUTH ONCE A WEEK, WITH A FULL GLASS OF WATER ON AN EMPTY STOMACH, REMAIN UPRIGHT FOR 30 MINUTES (Patient not taking: No sig reported) 12 tablet 0  . ibuprofen (ADVIL) 200 MG tablet Take 400 mg by mouth 2 (two) times daily. (Patient not taking: No sig reported)     No current facility-administered medications for this visit.     PHYSICAL EXAMINATION: ECOG PERFORMANCE STATUS: 1 - Symptomatic but completely ambulatory Vitals:   01/03/21 1157  BP: (!) 87/55  Pulse: 76  Temp: 98.1 F (36.7 C)   Filed Weights   01/03/21 1157  Weight: 99 lb 8 oz (45.1 kg)    Physical Exam Constitutional:      General: She is not in acute distress. HENT:     Head: Normocephalic and atraumatic.  Eyes:     General: No scleral icterus. Cardiovascular:     Rate and Rhythm: Normal rate and regular rhythm.     Heart sounds: Normal heart sounds.  Pulmonary:     Effort: Pulmonary effort is normal. No  respiratory distress.     Breath sounds: No wheezing.  Abdominal:     General: Bowel sounds are normal. There is no distension.     Palpations: Abdomen is soft.  Musculoskeletal:        General: No deformity. Normal range of motion.     Cervical back: Normal range of motion and neck supple.  Skin:    General: Skin is warm and dry.     Findings: No erythema or rash.  Neurological:     Mental Status: She is alert and oriented to person, place, and time. Mental status is at baseline.     Cranial Nerves: No cranial nerve deficit.     Coordination: Coordination normal.  Psychiatric:        Mood and Affect: Mood normal.     LABORATORY DATA:  I have reviewed the data as listed Lab Results  Component Value Date   WBC 11.8 (H) 01/03/2021   HGB 7.8 (L) 01/03/2021   HCT 25.7 (L) 01/03/2021   MCV 103.6 (H) 01/03/2021   PLT 433 (H) 01/03/2021   Recent Labs    01/14/20 1200 04/15/20 1051 05/11/20 0940 12/21/20 0841  NA 137 137 137 135  K 3.8 4.1 4.3 4.6  CL 105 103 104 103  CO2 21* 25 24 24   GLUCOSE 103* 90 113* 106*  BUN 19 20 24* 17  CREATININE 0.89 1.00 1.12* 0.99*  CALCIUM 9.8 9.4 9.3 9.0  GFRNONAA >60 54* 47*  --   GFRAA >60 >60 54*  --   PROT 7.8 7.4 7.3 7.0  ALBUMIN 4.5 4.1 4.1  --   AST 24 29 24 12   ALT 24 27 23 8   ALKPHOS 63 68 72  --   BILITOT 0.6 0.7 0.8 0.5   Iron/TIBC/Ferritin/ %Sat    Component Value Date/Time   IRON 21 (L) 12/30/2020 1123   TIBC 276 12/30/2020 1123   FERRITIN 473 (H) 12/30/2020 1123   IRONPCTSAT 8 (L) 12/30/2020 1123      RADIOGRAPHIC STUDIES: I have personally reviewed the radiological images as listed and agreed with the findings in the report.  No results found.  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 Personal history of tobacco use, presenting hazards to health   3. Weight loss   4. Constipation, unspecified constipation type    IgG lambda MGUS Labs reviewed and discussed  with patient. M protein has stayed stable at 0.5. NT proBNP is increased at 902--patient denies any previous cardiovascular disease. Neuropathy, no diabetes, etiology unknown. 12/30/2020, hemoglobin 6.9. Today repeat CBC showed hemoglobin 7.8, MCV 103.6 I recommend patient to proceed with bone marrow biopsy for further evaluation of possible progression to myeloma. Rule out amyloidosis/staining of Congo red  Symptomatic anemia, recommend patient to proceed with 1 unit of PRBC transfusion today. Etiology of anemia-unknown.   Iron panel showed a decreased iron saturation at 8, ferritin increased at 473. Iron panel may be falsely elevated due to underlying rheumatoid arthritis/chronic inflammation.   #History of extensive smoking history. 09/15/2020, CT lung cancer screening Lung-RADS 2 #Severe constipation, weight loss, I have discussed with patient about recommendation of her establishing gastroenterology work-up. She has never had a colonoscopy. Patient prefers to defer GI work up for now. Recommend CT abdomen pelvis. She declined.  Orders Placed This Encounter  Procedures  . CT BONE MARROW BIOPSY & ASPIRATION    Standing Status:   Future    Standing Expiration Date:   01/03/2022    Scheduling Instructions:     Please perform Congo Red staining    Order Specific Question:   Reason for Exam (SYMPTOM  OR DIAGNOSIS REQUIRED)    Answer:   MGUS    Order Specific Question:   Preferred location?    Answer:   [facility]    Order Specific Question:   Radiology Contrast Protocol - do NOT remove file path    Answer:   [email or web address]  . ABO/Rh    Standing Status:   Future    Number of Occurrences:   1    Standing Expiration Date:   01/03/2022    All questions were answered. The patient knows to call the clinic with any problems questions or concerns.  cc Nobie Putnam *    Return of visit: 1week after bone marrow biopsy    Earlie Server, MD, PhD Hematology Oncology [facility] at [facility] Pager- [phone] 01/03/2021

## 2021-01-03 NOTE — Telephone Encounter (Signed)
Done.. I have her sched to RTC on 12/20/20 @ 1045

## 2021-01-04 NOTE — Telephone Encounter (Signed)
Patient notified of appts.  

## 2021-01-05 ENCOUNTER — Encounter: Payer: Self-pay | Admitting: Family Medicine

## 2021-01-05 ENCOUNTER — Other Ambulatory Visit: Payer: Self-pay | Admitting: Radiology

## 2021-01-05 ENCOUNTER — Inpatient Hospital Stay: Payer: Medicare Other

## 2021-01-05 ENCOUNTER — Other Ambulatory Visit: Payer: Self-pay | Admitting: Student

## 2021-01-05 DIAGNOSIS — D649 Anemia, unspecified: Secondary | ICD-10-CM | POA: Diagnosis not present

## 2021-01-05 DIAGNOSIS — M069 Rheumatoid arthritis, unspecified: Secondary | ICD-10-CM | POA: Diagnosis not present

## 2021-01-05 DIAGNOSIS — I7300 Raynaud's syndrome without gangrene: Secondary | ICD-10-CM | POA: Diagnosis not present

## 2021-01-05 DIAGNOSIS — K5900 Constipation, unspecified: Secondary | ICD-10-CM | POA: Diagnosis not present

## 2021-01-05 DIAGNOSIS — D539 Nutritional anemia, unspecified: Secondary | ICD-10-CM | POA: Insufficient documentation

## 2021-01-05 DIAGNOSIS — D472 Monoclonal gammopathy: Secondary | ICD-10-CM | POA: Insufficient documentation

## 2021-01-05 DIAGNOSIS — R634 Abnormal weight loss: Secondary | ICD-10-CM | POA: Diagnosis not present

## 2021-01-05 MED ORDER — SODIUM CHLORIDE 0.9% IV SOLUTION
250.0000 mL | Freq: Once | INTRAVENOUS | Status: AC
  Administered 2021-01-05: 250 mL via INTRAVENOUS
  Filled 2021-01-05: qty 250

## 2021-01-05 MED ORDER — DIPHENHYDRAMINE HCL 25 MG PO CAPS
25.0000 mg | ORAL_CAPSULE | Freq: Once | ORAL | Status: AC
  Administered 2021-01-05: 25 mg via ORAL
  Filled 2021-01-05: qty 1

## 2021-01-05 MED ORDER — ACETAMINOPHEN 325 MG PO TABS: 650.0000 mg | ORAL_TABLET | Freq: Once | ORAL | Status: DC

## 2021-01-05 NOTE — Progress Notes (Signed)
Completed forms for patient's son, Ashley Jacobs to have FMLA due to mother's serious health condition.  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

## 2021-01-05 NOTE — Progress Notes (Signed)
Pt received 1u  Of pRBCs in clinic today. Tolerated well. VSS @ d/c.

## 2021-01-06 ENCOUNTER — Ambulatory Visit: Payer: Medicare Other | Admitting: Oncology

## 2021-01-06 ENCOUNTER — Other Ambulatory Visit: Payer: Self-pay | Admitting: Radiology

## 2021-01-06 LAB — TYPE AND SCREEN
ABO/RH(D): B POS
Antibody Screen: NEGATIVE
Unit division: 0

## 2021-01-06 LAB — BPAM RBC
Blood Product Expiration Date: 202203172359
ISSUE DATE / TIME: 202202241000
Unit Type and Rh: 7300

## 2021-01-06 NOTE — H&P (Signed)
Chief Complaint: Patient was seen in consultation today for a bone marrow biopsy and aspiration.   Referring Physician(s): Earlie Server  Supervising Physician: Ruthann Cancer  Patient Status: [facility] - Out-pt  History of Present Illness: Alexis Owens is a 80 y.o. 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 Lab work up for rheumatoid arthritis in 2016 revealed abnormal serum protein electrophoresis and IgG monoclonal lambda light chain and she has been followed by Oncology since then. Recent lab work shows worsening anemia along with patient complaints of profound weakness, nausea and loss of appetite with weight loss.   Interventional Radiology has been asked to evaluate this patient for an image-guided bone marrow biopsy and aspiration for further work up of possible progression to myeloma.   Past Medical History:  Diagnosis Date  . Allergy   . Arthritis   . Bunion   . Chronic sinusitis   . COPD (chronic obstructive pulmonary disease) ([place])   . High risk medication use   . Hyperlipidemia   . Hypertension   . Hypothyroidism   . Insomnia   . Macular degeneration   . Osteoporosis   . Paroxysmal A-fib ([place])   . Prediabetes   . Rheumatoid arthritis [facility])     Past Surgical History:  Procedure Laterality Date  . APPENDECTOMY    . 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 LEG SURGERY     Rt leg surgery for Compound fracture of the Right tibia     Allergies: Penicillin g, Penicillins, Sulfa antibiotics, and Sulfasalazine  Medications: Prior to Admission medications   Medication Sig Start Date End Date Taking? Authorizing Provider  acetaminophen (TYLENOL) 650 MG CR tablet Take  by mouth.    [provider]  albuterol (VENTOLIN HFA) 108 (90 Base) MCG/ACT inhaler INHALE 1 TO 2 PUFFS INTO THE LUNGS EVERY 6 HOURS AS NEEDED FOR WHEEZING OR SHORTNESS OF BREATH 07/29/20   Karamalegos, Devonne Doughty, DO  alendronate (FOSAMAX) 70 MG tablet TAKE 1 TABLET BY MOUTH ONCE A WEEK, WITH A FULL GLASS OF WATER ON AN EMPTY STOMACH, REMAIN UPRIGHT FOR 30 MINUTES Patient not taking: No sig reported 06/12/19   Mikey College, NP  ciprofloxacin (CIPRO) 500 MG tablet Take 1 tablet (500 mg total) by mouth 2 (two) times daily. For 5 days 12/29/20   Olin Hauser, DO  diltiazem (DILACOR XR) 180 MG 24 hr capsule Take 1 capsule (180 mg total) by mouth daily. 06/12/19   Mikey College, NP  fluticasone [facility]) 50 MCG/ACT nasal spray SHAKE LIQUID AND USE 2 SPRAYS IN [facility] NOSTRIL DAILY 07/19/20   Karamalegos, Devonne Doughty, DO  folic acid (FOLVITE) 1 MG tablet Take 1 mg by mouth daily.    [provider]  furosemide (LASIX) 20 MG tablet Take 1 tablet (20 mg total) by mouth daily as needed for fluid or edema. Short term use only 3-5 days as needed at a time. 04/19/20   Karamalegos, Devonne Doughty, DO  gabapentin (NEURONTIN) 100 MG capsule TAKE 1 CAPSULE IN MORNING AND 1 IN AFTERNOON AND 3 AT BEDTIME 12/29/20   Karamalegos, Devonne Doughty, DO  hydroxychloroquine (PLAQUENIL) 200 MG tablet Take 300 mg daily (1 tab and a half ) daily, 90 days 10/14/17   [provider]  ibuprofen (ADVIL) 200 MG tablet Take 400 mg by mouth 2 (two) times daily. Patient not taking: No sig reported    [provider]  levothyroxine (SYNTHROID) 25 MCG tablet TAKE 1 TABLET(25 MCG) BY MOUTH DAILY BEFORE BREAKFAST 12/09/20   Karamalegos, Devonne Doughty, DO  lisinopril (ZESTRIL) 20 MG tablet TAKE 1 TABLET(20 MG) BY MOUTH DAILY 12/09/20   Parks Ranger, Devonne Doughty, DO  mupirocin ointment (BACTROBAN) 2 % Apply 1 application topically 2 (two) times daily. For up to 2 weeks. 04/19/20   Karamalegos,  Devonne Doughty, DO  omeprazole (PRILOSEC) 20 MG capsule TAKE 1 CAPSULE(20 MG) BY MOUTH DAILY 09/11/20   Karamalegos, Devonne Doughty, DO  promethazine (PHENERGAN) 12.5 MG tablet TAKE 1 TO 2 TABLETS(12.5 TO 25 MG) BY MOUTH EVERY 8 HOURS AS NEEDED FOR NAUSEA OR VOMITING 04/14/20   Parks Ranger, Devonne Doughty, DO  traMADol Veatrice Bourbon) 50 MG tablet Take by mouth. 09/22/20   [provider]  traZODone (DESYREL) 50 MG tablet Take 1 tablet (50 mg total) by mouth at bedtime. 11/08/20   Karamalegos, Devonne Doughty, DO  zoledronic acid (RECLAST) 5 MG/100ML SOLN injection Inject 5 mg into the vein once.    [provider]     Family History  Problem Relation Age of Onset  . Breast cancer Sister   . Heart disease Brother   . Diabetes Brother   . Osteoarthritis Mother   . Lung disease Father     Social History   Socioeconomic History  . Marital status: Married    Spouse name: Not on file  . Number of children: Not on file  . Years of education: Not on file  . Highest education level: Not on file  Occupational History  . Occupation: retired  Tobacco Use  . Smoking status: Former Smoker    Quit date: 07/19/2014    Years since quitting: 6.4  . Smokeless tobacco: Never Used  Vaping Use  . Vaping Use: Never used  Substance and Sexual Activity  . Alcohol use: Not Currently    Alcohol/week: 14.0 standard drinks    Types: 14 Glasses of wine per week  . Drug use: No  . Sexual activity: Not on file  Other Topics Concern  . Not on file  Social History Narrative  . Not on file   Social Determinants of Health   Financial Resource Strain: Low Risk   . Difficulty of Paying Living Expenses: Not hard at all  Food Insecurity: No Food Insecurity  . Worried About Charity fundraiser in the Last Year: Never true  . Ran Out of Food in the Last Year: Never true  Transportation Needs: No Transportation Needs  . Lack of Transportation (Medical): No  . Lack of Transportation (Non-Medical): No  Physical  Activity: Inactive  . Days of Exercise per Week: 0 days  . Minutes of Exercise per Session: 0 min  Stress: No Stress Concern Present  . Feeling of Stress : Not at all  Social Connections: Not on file    Review of Systems: A 12 point ROS discussed and pertinent positives are indicated in the HPI above.  All other systems are negative.  Review of Systems  Constitutional: Positive for fatigue. Negative for appetite change.  Respiratory: Negative for cough and shortness of breath.   Cardiovascular: Negative for chest pain and leg swelling.  Gastrointestinal: Negative for abdominal pain, diarrhea, nausea and vomiting.  Neurological: Positive for headaches. Negative for dizziness.  Hematological: Bruises/bleeds easily.    Vital Signs:  BP 129/61   Pulse 80   Temp 98.4 F (36.9 C) (Oral)   Resp 20   Ht 5' 1"  (1.549 m)   Wt 120 lb (54.4 kg)   SpO2 100%   BMI 22.67 kg/m   Physical Exam Constitutional:      General: She is not in acute distress.    Appearance: She is underweight.  HENT:     Mouth/Throat:     Mouth: Mucous membranes are dry.     Pharynx: Oropharynx is clear.  Cardiovascular:     Rate and Rhythm: Normal rate and regular rhythm.     Heart sounds: Murmur heard.    Abdominal:     General: Bowel sounds are normal.     Palpations: Abdomen is soft.     Tenderness: There is no abdominal tenderness.  Musculoskeletal:        General: Normal range of motion.     Cervical back: Normal range of motion.  Skin:    General: Skin is warm and dry.  Neurological:     Mental Status: She is alert and oriented to person, place, and time.  Psychiatric:        Mood and Affect: Mood normal.        Behavior: Behavior normal.        Thought Content: Thought content normal.        Judgment: Judgment normal.     Imaging: No results found.  Labs:  CBC: Recent Labs    05/11/20 0940 12/21/20 0841 12/30/20 1123 01/03/21 1120  WBC 7.1 3.7* 4.5 11.8*  HGB 12.5 7.9* 6.9*  7.8*  HCT 36.1 24.2* 22.0* 25.7*  PLT 170 152 192 433*    COAGS: No results for input(s): INR, APTT in the last 8760 hours.  BMP: Recent Labs    01/14/20 1200 04/15/20 1051 05/11/20 0940 12/21/20 0841  NA 137 137 137 135  K 3.8 4.1 4.3 4.6  CL 105 103 104 103  CO2 21* 25 24 24   GLUCOSE 103* 90 113* 106*  BUN 19 20 24* 17  CALCIUM 9.8 9.4 9.3 9.0  CREATININE 0.89 1.00 1.12* 0.99*  GFRNONAA >60 54* 47*  --   GFRAA >60 >60 54*  --     LIVER FUNCTION TESTS: Recent Labs    01/14/20 1200 04/15/20 1051 05/11/20 0940 12/21/20 0841  BILITOT 0.6 0.7 0.8 0.5  AST 24 29 24 12   ALT 24 27 23 8   ALKPHOS 63 68 72  --   PROT 7.8 7.4 7.3 7.0  ALBUMIN 4.5 4.1 4.1  --     TUMOR MARKERS: No results for input(s): AFPTM, CEA, CA199, CHROMGRNA in the last 8760 hours.  Assessment and Plan:  Monoclonal gammopathy with possible progression to myeloma: Alexis Owens, 80 year old female, presents today to the [facility] Interventional Radiology department for an image-guided bone marrow biopsy and aspiration.  Risks and benefits of this procedure were discussed with the patient and/or patient's family including, but not limited to bleeding, infection, damage to adjacent structures or low yield requiring additional tests.  All of the questions were answered and there is agreement to proceed. She has been NPO. Vitals have been reviewed. Labs are pending but will be reviewed prior to the start of the procedure.   Consent signed and in chart.  Thank you for this interesting consult.  I greatly enjoyed meeting Alexis Owens and look forward to participating in their care.  A copy of this report was  sent to the requesting provider on this date.  Electronically Signed: Soyla Dryer, AGACNP-BC [phone] 01/09/2021, 8:14 AM   I spent a total of  30 Minutes   in face to face in clinical consultation, greater than 50% of which was counseling/coordinating care for  an image-guided bone marrow biopsy and aspiration.

## 2021-01-09 ENCOUNTER — Ambulatory Visit
Admission: RE | Admit: 2021-01-09 | Discharge: 2021-01-09 | Disposition: A | Discharge status: Home / Self Care | Payer: Medicare Other | Source: Ambulatory Visit | Attending: Oncology | Admitting: Oncology

## 2021-01-09 ENCOUNTER — Other Ambulatory Visit: Payer: Self-pay

## 2021-01-09 DIAGNOSIS — J449 Chronic obstructive pulmonary disease, unspecified: Secondary | ICD-10-CM | POA: Insufficient documentation

## 2021-01-09 DIAGNOSIS — Z79890 Hormone replacement therapy (postmenopausal): Secondary | ICD-10-CM | POA: Insufficient documentation

## 2021-01-09 DIAGNOSIS — I10 Essential (primary) hypertension: Secondary | ICD-10-CM | POA: Insufficient documentation

## 2021-01-09 DIAGNOSIS — I480 Paroxysmal atrial fibrillation: Secondary | ICD-10-CM | POA: Diagnosis not present

## 2021-01-09 DIAGNOSIS — D72829 Elevated white blood cell count, unspecified: Secondary | ICD-10-CM | POA: Insufficient documentation

## 2021-01-09 DIAGNOSIS — D649 Anemia, unspecified: Secondary | ICD-10-CM | POA: Diagnosis not present

## 2021-01-09 DIAGNOSIS — D72822 Plasmacytosis: Secondary | ICD-10-CM | POA: Diagnosis not present

## 2021-01-09 DIAGNOSIS — Z792 Long term (current) use of antibiotics: Secondary | ICD-10-CM | POA: Diagnosis not present

## 2021-01-09 DIAGNOSIS — Z79899 Other long term (current) drug therapy: Secondary | ICD-10-CM | POA: Diagnosis not present

## 2021-01-09 DIAGNOSIS — Z87891 Personal history of nicotine dependence: Secondary | ICD-10-CM | POA: Insufficient documentation

## 2021-01-09 DIAGNOSIS — D6489 Other specified anemias: Secondary | ICD-10-CM | POA: Diagnosis not present

## 2021-01-09 DIAGNOSIS — I7300 Raynaud's syndrome without gangrene: Secondary | ICD-10-CM | POA: Insufficient documentation

## 2021-01-09 DIAGNOSIS — Z96642 Presence of left artificial hip joint: Secondary | ICD-10-CM | POA: Diagnosis not present

## 2021-01-09 DIAGNOSIS — M069 Rheumatoid arthritis, unspecified: Secondary | ICD-10-CM | POA: Diagnosis not present

## 2021-01-09 DIAGNOSIS — Z7983 Long term (current) use of bisphosphonates: Secondary | ICD-10-CM | POA: Diagnosis not present

## 2021-01-09 DIAGNOSIS — D472 Monoclonal gammopathy: Secondary | ICD-10-CM | POA: Insufficient documentation

## 2021-01-09 DIAGNOSIS — F1190 Opioid use, unspecified, uncomplicated: Secondary | ICD-10-CM | POA: Insufficient documentation

## 2021-01-09 LAB — CBC WITH DIFFERENTIAL/PLATELET
Abs Immature Granulocytes: 0.62 10*3/uL — ABNORMAL HIGH (ref 0.00–0.07)
Basophils Absolute: 0.2 10*3/uL — ABNORMAL HIGH (ref 0.0–0.1)
Basophils Relative: 2 %
Eosinophils Absolute: 0.3 10*3/uL (ref 0.0–0.5)
Eosinophils Relative: 2 %
HCT: 34 % — ABNORMAL LOW (ref 36.0–46.0)
Hemoglobin: 11 g/dL — ABNORMAL LOW (ref 12.0–15.0)
Immature Granulocytes: 4 %
Lymphocytes Relative: 33 %
Lymphs Abs: 4.7 10*3/uL — ABNORMAL HIGH (ref 0.7–4.0)
MCH: 32.4 pg (ref 26.0–34.0)
MCHC: 32.4 g/dL (ref 30.0–36.0)
MCV: 100 fL (ref 80.0–100.0)
Monocytes Absolute: 2.1 10*3/uL — ABNORMAL HIGH (ref 0.1–1.0)
Monocytes Relative: 15 %
Neutro Abs: 6.2 10*3/uL (ref 1.7–7.7)
Neutrophils Relative %: 44 %
Platelets: 397 10*3/uL (ref 150–400)
RBC: 3.4 MIL/uL — ABNORMAL LOW (ref 3.87–5.11)
RDW: 18 % — ABNORMAL HIGH (ref 11.5–15.5)
Smear Review: NORMAL
WBC: 14.1 10*3/uL — ABNORMAL HIGH (ref 4.0–10.5)
nRBC: 0 % (ref 0.0–0.2)

## 2021-01-09 MED ORDER — HEPARIN SOD (PORK) LOCK FLUSH 100 UNIT/ML IV SOLN
INTRAVENOUS | Status: AC
  Filled 2021-01-09: qty 5

## 2021-01-09 MED ORDER — MIDAZOLAM HCL 2 MG/2ML IJ SOLN
INTRAMUSCULAR | Status: AC | PRN
  Administered 2021-01-09 (×2): 0.5 mg via INTRAVENOUS

## 2021-01-09 MED ORDER — MIDAZOLAM HCL 2 MG/2ML IJ SOLN
INTRAMUSCULAR | Status: AC
  Filled 2021-01-09: qty 2

## 2021-01-09 MED ORDER — FENTANYL CITRATE (PF) 100 MCG/2ML IJ SOLN
INTRAMUSCULAR | Status: AC | PRN
  Administered 2021-01-09 (×2): 25 ug via INTRAVENOUS

## 2021-01-09 MED ORDER — SODIUM CHLORIDE 0.9 % IV SOLN: INTRAVENOUS | Status: DC

## 2021-01-09 MED ORDER — FENTANYL CITRATE (PF) 100 MCG/2ML IJ SOLN
INTRAMUSCULAR | Status: AC
  Filled 2021-01-09: qty 2

## 2021-01-09 NOTE — Discharge Instructions (Signed)
Bone Marrow Aspiration and Bone Marrow Biopsy, Adult, Care After This sheet gives you information about how to care for yourself after your procedure. Your health care provider may also give you more specific instructions. If you have problems or questions, contact your health care provider. What can I expect after the procedure? After the procedure, it is common to have:  Mild pain and tenderness.  Swelling.  Bruising. Follow these instructions at home: Puncture site care  Follow instructions from your health care provider about how to take care of the puncture site. Make sure you: ? Wash your hands with soap and water before and after you change your bandage (dressing). If soap and water are not available, use hand sanitizer. ? Change your dressing as told by your health care provider.  Check your puncture site every day for signs of infection. Check for: ? More redness, swelling, or pain. ? Fluid or blood. ? Warmth. ? Pus or a bad smell.   Activity  Return to your normal activities as told by your health care provider. Ask your health care provider what activities are safe for you.  Do not lift anything that is heavier than 10 lb (4.5 kg), or the limit that you are told, until your health care provider says that it is safe.  Do not drive for 24 hours if you were given a sedative during your procedure. General instructions  Take over-the-counter and prescription medicines only as told by your health care provider.  Do not take baths, swim, or use a hot tub until your health care provider approves. Ask your health care provider if you may take showers. You may only be allowed to take sponge baths.  If directed, put ice on the affected area. To do this: ? Put ice in a plastic bag. ? Place a towel between your skin and the bag. ? Leave the ice on for 20 minutes, 2-3 times a day.  Keep all follow-up visits as told by your health care provider. This is important.   Contact a  health care provider if:  Your pain is not controlled with medicine.  You have a fever.  You have more redness, swelling, or pain around the puncture site.  You have fluid or blood coming from the puncture site.  Your puncture site feels warm to the touch.  You have pus or a bad smell coming from the puncture site. Summary  After the procedure, it is common to have mild pain, tenderness, swelling, and bruising.  Follow instructions from your health care provider about how to take care of the puncture site and what activities are safe for you.  Take over-the-counter and prescription medicines only as told by your health care provider.  Contact a health care provider if you have any signs of infection, such as fluid or blood coming from the puncture site. This information is not intended to replace advice given to you by your health care provider. Make sure you discuss any questions you have with your health care provider. Document Revised: 03/17/2019 Document Reviewed: 03/17/2019 Elsevier Patient Education  2021 Else[employer] Inc. Moderate Conscious Sedation, Adult, Care After This sheet gives you information about how to care for yourself after your procedure. Your health care provider may also give you more specific instructions. If you have problems or questions, contact your health care provider. What can I expect after the procedure? After the procedure, it is common to have:  Sleepiness for several hours.  Impaired judgment for several   hours.  Difficulty with balance.  Vomiting if you eat too soon. Follow these instructions at home: For the time period you were told by your health care provider:  Rest.  Do not participate in activities where you could fall or become injured.  Do not drive or use machinery.  Do not drink alcohol.  Do not take sleeping pills or medicines that cause drowsiness.  Do not make important decisions or sign legal documents.  Do not take  care of children on your own.      Eating and drinking  Follow the diet recommended by your health care provider.  Drink enough fluid to keep your urine pale yellow.  If you vomit: ? Drink water, juice, or soup when you can drink without vomiting. ? Make sure you have little or no nausea before eating solid foods.   General instructions  Take over-the-counter and prescription medicines only as told by your health care provider.  Have a responsible adult stay with you for the time you are told. It is important to have someone help care for you until you are awake and alert.  Do not smoke.  Keep all follow-up visits as told by your health care provider. This is important. Contact a health care provider if:  You are still sleepy or having trouble with balance after 24 hours.  You feel light-headed.  You keep feeling nauseous or you keep vomiting.  You develop a rash.  You have a fever.  You have redness or swelling around the IV site. Get help right away if:  You have trouble breathing.  You have new-onset confusion at home. Summary  After the procedure, it is common to feel sleepy, have impaired judgment, or feel nauseous if you eat too soon.  Rest after you get home. Know the things you should not do after the procedure.  Follow the diet recommended by your health care provider and drink enough fluid to keep your urine pale yellow.  Get help right away if you have trouble breathing or new-onset confusion at home. This information is not intended to replace advice given to you by your health care provider. Make sure you discuss any questions you have with your health care provider. Document Revised: 02/26/2020 Document Reviewed: 09/24/2019 Elsevier Patient Education  2021 Elsevier Inc.  

## 2021-01-09 NOTE — Procedures (Signed)
Interventional Radiology Procedure Note  Procedure: CT guided aspirate and core biopsy of right iliac bone  Complications: None  Recommendations: - Bedrest supine x 1 hrs - Hydrocodone PRN  Pain - Follow biopsy results   Izik Bingman, MD   

## 2021-01-11 LAB — SURGICAL PATHOLOGY

## 2021-01-13 LAB — SURGICAL PATHOLOGY

## 2021-01-17 ENCOUNTER — Inpatient Hospital Stay: Payer: Medicare Other | Attending: Oncology | Admitting: Oncology

## 2021-01-17 ENCOUNTER — Encounter: Payer: Self-pay | Admitting: Oncology

## 2021-01-17 VITALS — BP 102/65 | HR 87 | Temp 98.0°F | Resp 16

## 2021-01-17 DIAGNOSIS — Z803 Family history of malignant neoplasm of breast: Secondary | ICD-10-CM | POA: Insufficient documentation

## 2021-01-17 DIAGNOSIS — Z833 Family history of diabetes mellitus: Secondary | ICD-10-CM | POA: Diagnosis not present

## 2021-01-17 DIAGNOSIS — Z8249 Family history of ischemic heart disease and other diseases of the circulatory system: Secondary | ICD-10-CM | POA: Insufficient documentation

## 2021-01-17 DIAGNOSIS — I480 Paroxysmal atrial fibrillation: Secondary | ICD-10-CM | POA: Diagnosis not present

## 2021-01-17 DIAGNOSIS — D649 Anemia, unspecified: Secondary | ICD-10-CM | POA: Diagnosis not present

## 2021-01-17 DIAGNOSIS — Z79899 Other long term (current) drug therapy: Secondary | ICD-10-CM | POA: Insufficient documentation

## 2021-01-17 DIAGNOSIS — M069 Rheumatoid arthritis, unspecified: Secondary | ICD-10-CM | POA: Insufficient documentation

## 2021-01-17 DIAGNOSIS — E039 Hypothyroidism, unspecified: Secondary | ICD-10-CM | POA: Diagnosis not present

## 2021-01-17 DIAGNOSIS — E785 Hyperlipidemia, unspecified: Secondary | ICD-10-CM | POA: Insufficient documentation

## 2021-01-17 DIAGNOSIS — D472 Monoclonal gammopathy: Secondary | ICD-10-CM | POA: Diagnosis not present

## 2021-01-17 DIAGNOSIS — Z801 Family history of malignant neoplasm of trachea, bronchus and lung: Secondary | ICD-10-CM | POA: Diagnosis not present

## 2021-01-17 DIAGNOSIS — I10 Essential (primary) hypertension: Secondary | ICD-10-CM | POA: Diagnosis not present

## 2021-01-17 DIAGNOSIS — Z87891 Personal history of nicotine dependence: Secondary | ICD-10-CM | POA: Diagnosis not present

## 2021-01-17 DIAGNOSIS — J449 Chronic obstructive pulmonary disease, unspecified: Secondary | ICD-10-CM | POA: Insufficient documentation

## 2021-01-17 NOTE — Progress Notes (Signed)
Hematology/Oncology follow up note [facility] Telephone:([phone]) [phone] Fax:([phone]) [phone]   Patient Care Team: Olin Hauser, DO as PCP - General (Family Medicine)  REFERRING PROVIDER: Nobie Putnam *  CHIEF COMPLAINTS/REASON FOR VISIT:  Follow-up for IgG lambda MGUS  HISTORY OF PRESENTING ILLNESS:   Alexis Owens is a  80 y.o.  female with PMH listed below was seen in consultation at the request of  Nobie Putnam *  for evaluation of monoclonal gammopathy  Patient sees rheumatology Dr. Meda Coffee for rheumatoid arthritis.  Patient has positive CCP, arthritis was diagnosed in 2016.  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  Her work-up showed abnormal serum protein electrophoresis, IgG monoclonal lambda light chain.  Patient was referred to cancer center for further evaluation. Chronic joint pain. She complains feeling nauseated for the past 1 months.  Some diarrhea as well.  # seen by me on 04/29/2020, she lost follow-up. She was seen by primary care provider recently on 12/29/2020 and was found to have worsening of anemia and decline of functional status.  Patient reestablished care with me on 01/03/2021.  INTERVAL HISTORY Alexis Owens is a 80 y.o. female who has above history reviewed by me today presents for follow up visit for management of MGUS weight loss, anemia. Problems and complaints are listed below: Patient had bone marrow biopsy done during interval and presents to discuss results. Reports feeling fatigued and weak.  Currently off methotrexate treatments for rheumatoid arthritis. Reports low-grade fever in the evenings, no higher than 100.1 for the past week.  She takes Tylenol for fever.  Increase of fatigue.  Hip pain with movement.  She declined being weighed today.  Review of Systems  Constitutional: Positive for appetite change, fatigue and fever. Negative for chills.  HENT:   Negative for hearing loss and voice change.    Eyes: Negative for eye problems.  Respiratory: Negative for chest tightness and cough.   Cardiovascular: Negative for chest pain.  Gastrointestinal: Negative for abdominal distention and abdominal pain.  Endocrine: Negative for hot flashes.  Genitourinary: Negative for difficulty urinating and frequency.   Musculoskeletal: Positive for arthralgias.  Skin: Negative for itching and rash.  Neurological: Positive for numbness. Negative for extremity weakness.  Hematological: Negative for adenopathy.  Psychiatric/Behavioral: Negative for confusion.    MEDICAL HISTORY:  Past Medical History:  Diagnosis Date  . Allergy   . Arthritis   . Bunion   . Chronic sinusitis   . COPD (chronic obstructive pulmonary disease) ([place])   . High risk medication use   . Hyperlipidemia   . Hypertension   . Hypothyroidism   . Insomnia   . Macular degeneration   . Osteoporosis   . Paroxysmal A-fib ([place])   . Prediabetes   . Rheumatoid arthritis ([place])     SURGICAL HISTORY: Past Surgical History:  Procedure Laterality Date  . APPENDECTOMY    . 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 LEG SURGERY     Rt leg surgery for Compound fracture of the Right tibia     SOCIAL HISTORY: Social History   Socioeconomic History  . Marital status: Married    Spouse name: Not on file  . Number of children: Not on file  . Years of education: Not on file  . Highest education level: Not on file  Occupational History  . Occupation: retired  Tobacco Use  .  Smoking status: Former Smoker    Quit date: 07/19/2014    Years since quitting: 6.5  . Smokeless tobacco: Never Used  Vaping Use  . Vaping Use: Never used  Substance and Sexual Activity  . Alcohol use: Not Currently    Alcohol/week: 14.0 standard drinks    Types: 14 Glasses of wine per week  . Drug use: No  .  Sexual activity: Not on file  Other Topics Concern  . Not on file  Social History Narrative  . Not on file   Social Determinants of Health   Financial Resource Strain: Low Risk   . Difficulty of Paying Living Expenses: Not hard at all  Food Insecurity: No Food Insecurity  . Worried About Charity fundraiser in the Last Year: Never true  . Ran Out of Food in the Last Year: Never true  Transportation Needs: No Transportation Needs  . Lack of Transportation (Medical): No  . Lack of Transportation (Non-Medical): No  Physical Activity: Inactive  . Days of Exercise per Week: 0 days  . Minutes of Exercise per Session: 0 min  Stress: No Stress Concern Present  . Feeling of Stress : Not at all  Social Connections: Not on file  Intimate Partner Violence: Not on file    FAMILY HISTORY: Family History  Problem Relation Age of Onset  . Breast cancer Sister   . Heart disease Brother   . Diabetes Brother   . Osteoarthritis Mother   . Lung disease Father     ALLERGIES:  is allergic to penicillin g, penicillins, sulfa antibiotics, and sulfasalazine.  MEDICATIONS:  Current Outpatient Medications  Medication Sig Dispense Refill  . acetaminophen (TYLENOL) 650 MG CR tablet Take by mouth.    Marland Kitchen albuterol (VENTOLIN HFA) 108 (90 Base) MCG/ACT inhaler INHALE 1 TO 2 PUFFS INTO THE LUNGS EVERY 6 HOURS AS NEEDED FOR WHEEZING OR SHORTNESS OF BREATH 8.5 g 2  . diltiazem (DILACOR XR) 180 MG 24 hr capsule Take 1 capsule (180 mg total) by mouth daily. 90 capsule 1  . fluticasone (FLONASE) 50 MCG/ACT nasal spray SHAKE LIQUID AND USE 2 SPRAYS IN EACH NOSTRIL DAILY 48 g 0  . folic acid (FOLVITE) 1 MG tablet Take 1 mg by mouth daily.    . furosemide (LASIX) 20 MG tablet Take 1 tablet (20 mg total) by mouth daily as needed for fluid or edema. Short term use only 3-5 days as needed at a time. 30 tablet 2  . gabapentin (NEURONTIN) 100 MG capsule TAKE 1 CAPSULE IN MORNING AND 1 IN AFTERNOON AND 3 AT BEDTIME 450  capsule 1  . hydroxychloroquine (PLAQUENIL) 200 MG tablet Take 300 mg daily (1 tab and a half ) daily, 90 days    . levothyroxine (SYNTHROID) 25 MCG tablet TAKE 1 TABLET(25 MCG) BY MOUTH DAILY BEFORE BREAKFAST 90 tablet 0  . lisinopril (ZESTRIL) 20 MG tablet TAKE 1 TABLET(20 MG) BY MOUTH DAILY 90 tablet 0  . mupirocin ointment (BACTROBAN) 2 % Apply 1 application topically 2 (two) times daily. For up to 2 weeks. 30 g 1  . omeprazole (PRILOSEC) 20 MG capsule TAKE 1 CAPSULE(20 MG) BY MOUTH DAILY 90 capsule 3  . promethazine (PHENERGAN) 12.5 MG tablet TAKE 1 TO 2 TABLETS(12.5 TO 25 MG) BY MOUTH EVERY 8 HOURS AS NEEDED FOR NAUSEA OR VOMITING 30 tablet 2  . traMADol (ULTRAM) 50 MG tablet Take by mouth.    . traZODone (DESYREL) 50 MG tablet Take 1 tablet (50 mg  total) by mouth at bedtime. 90 tablet 1  . zoledronic acid (RECLAST) 5 MG/100ML SOLN injection Inject 5 mg into the vein once.    Marland Kitchen alendronate (FOSAMAX) 70 MG tablet TAKE 1 TABLET BY MOUTH ONCE A WEEK, WITH A FULL GLASS OF WATER ON AN EMPTY STOMACH, REMAIN UPRIGHT FOR 30 MINUTES (Patient not taking: No sig reported) 12 tablet 0  . ciprofloxacin (CIPRO) 500 MG tablet Take 1 tablet (500 mg total) by mouth 2 (two) times daily. For 5 days (Patient not taking: No sig reported) 10 tablet 0  . ibuprofen (ADVIL) 200 MG tablet Take 400 mg by mouth 2 (two) times daily. (Patient not taking: Reported on 01/17/2021)     No current facility-administered medications for this visit.     PHYSICAL EXAMINATION: ECOG PERFORMANCE STATUS: 1 - Symptomatic but completely ambulatory Vitals:   01/17/21 1115  BP: 102/65  Pulse: 87  Resp: 16  Temp: 98 F (36.7 C)   There were no vitals filed for this visit.  Physical Exam Constitutional:      General: She is not in acute distress. HENT:     Head: Normocephalic and atraumatic.  Eyes:     General: No scleral icterus. Cardiovascular:     Rate and Rhythm: Normal rate and regular rhythm.     Heart sounds:  Normal heart sounds.  Pulmonary:     Effort: Pulmonary effort is normal. No respiratory distress.     Breath sounds: No wheezing.  Abdominal:     General: Bowel sounds are normal. There is no distension.     Palpations: Abdomen is soft.  Musculoskeletal:        General: No deformity. Normal range of motion.     Cervical back: Normal range of motion and neck supple.  Skin:    General: Skin is warm and dry.     Findings: No erythema or rash.  Neurological:     Mental Status: She is alert and oriented to person, place, and time. Mental status is at baseline.     Cranial Nerves: No cranial nerve deficit.     Coordination: Coordination normal.  Psychiatric:        Mood and Affect: Mood normal.     LABORATORY DATA:  I have reviewed the data as listed Lab Results  Component Value Date   WBC 14.1 (H) 01/09/2021   HGB 11.0 (L) 01/09/2021   HCT 34.0 (L) 01/09/2021   MCV 100.0 01/09/2021   PLT 397 01/09/2021   Recent Labs    04/15/20 1051 05/11/20 0940 12/21/20 0841  NA 137 137 135  K 4.1 4.3 4.6  CL 103 104 103  CO2 25 24 24   GLUCOSE 90 113* 106*  BUN 20 24* 17  CREATININE 1.00 1.12* 0.99*  CALCIUM 9.4 9.3 9.0  GFRNONAA 54* 47*  --   GFRAA >60 54*  --   PROT 7.4 7.3 7.0  ALBUMIN 4.1 4.1  --   AST 29 24 12   ALT 27 23 8   ALKPHOS 68 72  --   BILITOT 0.7 0.8 0.5   Iron/TIBC/Ferritin/ %Sat    Component Value Date/Time   IRON 21 (L) 12/30/2020 1123   TIBC 276 12/30/2020 1123   FERRITIN 473 (H) 12/30/2020 1123   IRONPCTSAT 8 (L) 12/30/2020 1123      RADIOGRAPHIC STUDIES: I have personally reviewed the radiological images as listed and agreed with the findings in the report.  CT BONE MARROW BIOPSY & ASPIRATION  Result Date:  01/09/2021 INDICATION: 80 year old female with monoclonal gammopathy of undetermined significance. EXAM: CT-GUIDED BONE MARROW BIOPSY AND ASPIRATION MEDICATIONS: None ANESTHESIA/SEDATION: Fentanyl 50 mcg IV; Versed 1 mg IV Sedation Time: 11  minutes; The patient was continuously monitored during the procedure by the interventional radiology nurse under my direct supervision. COMPLICATIONS: None immediate. PROCEDURE: Informed consent was obtained from the patient following an explanation of the procedure, risks, benefits and alternatives. The patient understands, agrees and consents for the procedure. All questions were addressed. A time out was performed prior to the initiation of the procedure. The patient was positioned prone and non-contrast localization CT was performed of the pelvis to demonstrate the iliac marrow spaces. The operative site was prepped and draped in the usual sterile fashion. Under sterile conditions and local anesthesia, a 22 gauge spinal needle was utilized for procedural planning. Next, an 11 gauge coaxial bone biopsy needle was advanced into the right iliac marrow space. Needle position was confirmed with CT imaging. Initially, a bone marrow aspiration was performed. Next, a bone marrow biopsy was obtained with the 11 gauge outer bone marrow device. The 11 gauge coaxial bone biopsy needle was re-advanced into a slightly different location within the right iliac marrow space, positioning was confirmed with CT imaging and an additional bone marrow biopsy was obtained. Samples were prepared with the cytotechnologist and deemed adequate. The needle was removed and superficial hemostasis was obtained with manual compression. A dressing was applied. The patient tolerated the procedure well without immediate post procedural complication. IMPRESSION: Successful CT guided right iliac bone marrow aspiration and core biopsy. Ruthann Cancer, MD [facility] Radiology Electronically Signed   By: Ruthann Cancer MD   On: 01/09/2021 09:53      ASSESSMENT & PLAN:  1. MGUS (monoclonal gammopathy of unknown significance)    IgG lambda MGUS 01/09/2021 bone marrow biopsy showed hypercellular bone  marrow with granulocytic proliferation.  Several lymphoid aggregates.  Slight polyclonal plasmacytosis, 5%.  No increase of blasts.  Some dyspoietic changes primarily involving the granulocytic and megakaryocytic lines.  Flow cytometry did not show any significant phenotype abnormalities.  As a result, no definitive for diagnostic evidence of plasma cell neoplasm.  [place] red is negative, no evidence of amyloidosis. Recommend continue observation.  Peter multiple myeloma panel in 6 months.  #Anemia, CBC done on the bone marrow biopsy day 01/09/2021 showed significant improvement of hemoglobin level to 11.  Probably due to recently being off methotrexate.  #Fatigue/weight loss/low-grade fever Suspecting underlying malignancy.  She has a lung screening CT done in November 2022.  No recent abdomen images. She declined being referred to gastroenterology for further evaluation. I discussed with her about CT abdomen pelvis images which she also declined.   Orders Placed This Encounter  Procedures  . CBC with Differential/Platelet    Standing Status:   Future    Standing Expiration Date:   01/17/2022  . Comprehensive metabolic panel    Standing Status:   Future    Standing Expiration Date:   01/17/2022  . Multiple Myeloma Panel (SPEP&IFE w/QIG)    Standing Status:   Future    Standing Expiration Date:   01/17/2022  . Kappa/lambda light chains    Standing Status:   Future    Standing Expiration Date:   01/17/2022    All questions were answered. The patient knows to call the clinic with any problems questions or concerns.  cc Nobie Putnam *    Return of visit: 6 months  Earlie Server, MD, PhD Hematology Oncology [facility] Pager- [phone] 01/17/2021

## 2021-01-17 NOTE — Progress Notes (Signed)
Patient reports having fever in the evenings, no higher than 100.1, for the past week.  Takes 2 Tylenol to help with the fevers.  Also has noticed an increase in fatigue especially in the evening for the past week.  New left hip pain with certain movements 9/02 pail level.

## 2021-01-18 DIAGNOSIS — R350 Frequency of micturition: Secondary | ICD-10-CM

## 2021-01-18 DIAGNOSIS — N3001 Acute cystitis with hematuria: Secondary | ICD-10-CM

## 2021-01-18 MED ORDER — CIPROFLOXACIN HCL 500 MG PO TABS: 500.0000 mg | ORAL_TABLET | Freq: Two times a day (BID) | ORAL | 0 refills | Status: DC

## 2021-01-19 ENCOUNTER — Encounter (HOSPITAL_COMMUNITY): Payer: Self-pay | Admitting: Oncology

## 2021-02-07 DIAGNOSIS — M159 Polyosteoarthritis, unspecified: Secondary | ICD-10-CM | POA: Diagnosis not present

## 2021-02-07 DIAGNOSIS — M059 Rheumatoid arthritis with rheumatoid factor, unspecified: Secondary | ICD-10-CM | POA: Diagnosis not present

## 2021-02-07 DIAGNOSIS — Z79899 Other long term (current) drug therapy: Secondary | ICD-10-CM | POA: Diagnosis not present

## 2021-02-14 ENCOUNTER — Telehealth: Payer: Self-pay | Admitting: Family Medicine

## 2021-02-14 NOTE — Telephone Encounter (Signed)
Patient son Ashley Jacobs needs a letter stating that he will be taking care of mom until 03/28/2021. He states that forms were filled out before but the date was March 17th. He is still here taking care of mom and expected to be until May. Please advise  [phone]

## 2021-02-15 NOTE — Telephone Encounter (Signed)
Called patient's son, spoke directly with Alexis Owens. Updated the letter, sent on mychart for caregiver need up through 03/28/21 overall 3 months, can adjust if needed. He is concerned about overall long term plan and thinks they may need to arrange some more home help in future.  If need we can refer to CCM services.  Alexis Owens, [facility] 02/15/2021, 6:21 PM

## 2021-02-15 NOTE — Addendum Note (Signed)
Addended by: Olin Hauser on: 02/15/2021 06:21 PM   Modules accepted: Orders

## 2021-02-27 ENCOUNTER — Other Ambulatory Visit: Payer: Self-pay | Admitting: Family Medicine

## 2021-02-27 DIAGNOSIS — J309 Allergic rhinitis, unspecified: Secondary | ICD-10-CM

## 2021-03-22 ENCOUNTER — Other Ambulatory Visit: Payer: Self-pay | Admitting: Family Medicine

## 2021-03-22 DIAGNOSIS — I10 Essential (primary) hypertension: Secondary | ICD-10-CM

## 2021-03-22 NOTE — Telephone Encounter (Signed)
Requested Prescriptions  Pending Prescriptions Disp Refills  . lisinopril (ZESTRIL) 20 MG tablet [Pharmacy Med Name: LISINOPRIL 20MG  TABLETS] 90 tablet 0    Sig: TAKE 1 TABLET(20 MG) BY MOUTH DAILY     Cardiovascular:  ACE Inhibitors Failed - 03/22/2021  7:18 AM      Failed - Cr in normal range and within 180 days    Creat  Date Value Ref Range Status  12/21/2020 0.99 (H) 0.60 - 0.93 mg/dL Final    Comment:    For patients >80 years of age, the reference limit for Creatinine is approximately 13% higher for people identified as African-American. .          Passed - K in normal range and within 180 days    Potassium  Date Value Ref Range Status  12/21/2020 4.6 3.5 - 5.3 mmol/L Final         Passed - Patient is not pregnant      Passed - Last BP in normal range    BP Readings from Last 1 Encounters:  01/17/21 102/65         Passed - Valid encounter within last 6 months    Recent Outpatient Visits          2 months ago Acute cystitis with hematuria   [place], DO   4 months ago [facility] [place], Lupita Raider, FNP   11 months ago Non-healing ulcer of lower leg, right, limited to breakdown of skin [facility])   [place], DO   1 year ago MGUS (monoclonal gammopathy of unknown significance)   [place], Devonne Doughty, DO   1 year ago Essential hypertension   [place], DO      Future Appointments            In 9 months [facility], [facility]

## 2021-04-19 ENCOUNTER — Other Ambulatory Visit: Payer: Self-pay | Admitting: Family Medicine

## 2021-04-19 DIAGNOSIS — E039 Hypothyroidism, unspecified: Secondary | ICD-10-CM

## 2021-05-03 ENCOUNTER — Other Ambulatory Visit: Payer: Self-pay | Admitting: Family Medicine

## 2021-05-03 DIAGNOSIS — G4709 Other insomnia: Secondary | ICD-10-CM

## 2021-05-10 DIAGNOSIS — M792 Neuralgia and neuritis, unspecified: Secondary | ICD-10-CM | POA: Diagnosis not present

## 2021-05-10 DIAGNOSIS — Z79899 Other long term (current) drug therapy: Secondary | ICD-10-CM | POA: Diagnosis not present

## 2021-05-10 DIAGNOSIS — M159 Polyosteoarthritis, unspecified: Secondary | ICD-10-CM | POA: Diagnosis not present

## 2021-05-10 DIAGNOSIS — M059 Rheumatoid arthritis with rheumatoid factor, unspecified: Secondary | ICD-10-CM | POA: Diagnosis not present

## 2021-05-21 ENCOUNTER — Other Ambulatory Visit: Payer: Self-pay | Admitting: Family Medicine

## 2021-05-21 DIAGNOSIS — R059 Cough, unspecified: Secondary | ICD-10-CM

## 2021-05-21 DIAGNOSIS — J309 Allergic rhinitis, unspecified: Secondary | ICD-10-CM

## 2021-05-21 NOTE — Telephone Encounter (Signed)
Requested Prescriptions  Pending Prescriptions Disp Refills  . albuterol (VENTOLIN HFA) 108 (90 Base) MCG/ACT inhaler [Pharmacy Med Name: ALBUTEROL HFA INH (200 PUFFS)8.5GM] 8.5 g 2    Sig: INHALE 1 TO 2 PUFFS INTO THE LUNGS EVERY 6 HOURS AS NEEDED FOR WHEEZING OR SHORTNESS OF BREATH     Pulmonology:  Beta Agonists Failed - 05/21/2021  9:29 AM      Failed - One inhaler should last at least one month. If the patient is requesting refills earlier, contact the patient to check for uncontrolled symptoms.      Passed - Valid encounter within last 12 months    Recent Outpatient Visits          4 months ago Acute cystitis with hematuria   [place], DO   6 months ago [facility] [place], Lupita Raider, FNP   1 year ago Non-healing ulcer of lower leg, right, limited to breakdown of skin [facility])   [place], DO   1 year ago MGUS (monoclonal gammopathy of unknown significance)   [facility] Olin Hauser, DO   1 year ago Essential hypertension   [place], DO      Future Appointments            In 7 months [facility], [place]            . fluticasone (FLONASE) 50 MCG/ACT nasal spray [Pharmacy Med Name: FLUTICASONE 50MCG NASAL SP (120) RX] 48 g 0    Sig: SHAKE LIQUID AND USE 2 SPRAYS IN EACH NOSTRIL DAILY     Ear, Nose, and Throat: Nasal Preparations - Corticosteroids Passed - 05/21/2021  9:29 AM      Passed - Valid encounter within last 12 months    Recent Outpatient Visits          4 months ago Acute cystitis with hematuria   [place], DO   6 months ago [facility] [place], Lupita Raider, FNP   1 year ago Non-healing ulcer of lower leg, right, limited to breakdown of skin [facility])   [place], DO    1 year ago MGUS (monoclonal gammopathy of unknown significance)   [facility] [place], Devonne Doughty, DO   1 year ago Essential hypertension   [place], Devonne Doughty, DO      Future Appointments            In 7 months [facility], [facility]

## 2021-05-25 ENCOUNTER — Other Ambulatory Visit: Payer: Self-pay | Admitting: Family Medicine

## 2021-05-25 DIAGNOSIS — G4709 Other insomnia: Secondary | ICD-10-CM

## 2021-05-25 DIAGNOSIS — I10 Essential (primary) hypertension: Secondary | ICD-10-CM

## 2021-05-25 DIAGNOSIS — E039 Hypothyroidism, unspecified: Secondary | ICD-10-CM

## 2021-06-23 DIAGNOSIS — N289 Disorder of kidney and ureter, unspecified: Secondary | ICD-10-CM | POA: Diagnosis not present

## 2021-07-14 ENCOUNTER — Other Ambulatory Visit: Payer: Medicare Other

## 2021-07-19 ENCOUNTER — Other Ambulatory Visit: Payer: Self-pay | Admitting: Family Medicine

## 2021-07-19 DIAGNOSIS — E039 Hypothyroidism, unspecified: Secondary | ICD-10-CM

## 2021-07-19 DIAGNOSIS — I10 Essential (primary) hypertension: Secondary | ICD-10-CM

## 2021-07-19 MED ORDER — LEVOTHYROXINE SODIUM 25 MCG PO TABS: ORAL_TABLET | ORAL | 0 refills | Status: DC

## 2021-07-19 MED ORDER — LISINOPRIL 20 MG PO TABS: ORAL_TABLET | ORAL | 0 refills | Status: DC

## 2021-07-20 ENCOUNTER — Other Ambulatory Visit: Payer: Medicare Other

## 2021-07-21 ENCOUNTER — Ambulatory Visit: Payer: Medicare Other | Admitting: Oncology

## 2021-07-27 ENCOUNTER — Ambulatory Visit: Payer: Medicare Other | Admitting: Oncology

## 2021-08-05 ENCOUNTER — Other Ambulatory Visit: Payer: Self-pay | Admitting: Family Medicine

## 2021-08-05 DIAGNOSIS — G4709 Other insomnia: Secondary | ICD-10-CM

## 2021-08-05 NOTE — Telephone Encounter (Signed)
Requested medication (s) are due for refill today Yes    Last ordered 05/03/21 #90 with no refills  Requested medication (s) are on the active medication list Yes  Future visit scheduled No. TC-no answer.   Note to clinic-LOV 12/29/20. Routing to physician for review.   Requested Prescriptions  Pending Prescriptions Disp Refills   traZODone (DESYREL) 50 MG tablet [Pharmacy Med Name: TRAZODONE 50MG  TABLETS] 90 tablet 0    Sig: TAKE 1 TABLET(50 MG) BY MOUTH AT BEDTIME     Psychiatry: Antidepressants - Serotonin Modulator Failed - 08/05/2021 10:56 AM      Failed - Valid encounter within last 6 months    Recent Outpatient Visits           7 months ago Acute cystitis with hematuria   [place], DO   9 months ago [facility] [place], Lupita Raider, FNP   1 year ago Non-healing ulcer of lower leg, right, limited to breakdown of skin [facility])   [place], DO   1 year ago MGUS (monoclonal gammopathy of unknown significance)   [facility] [place], Devonne Doughty, DO   1 year ago Essential hypertension   [place], DO       Future Appointments             In 4 months [facility], [facility]

## 2021-08-07 ENCOUNTER — Encounter: Payer: Self-pay | Admitting: Family Medicine

## 2021-08-07 ENCOUNTER — Other Ambulatory Visit: Payer: Self-pay | Admitting: Family Medicine

## 2021-08-07 DIAGNOSIS — G4709 Other insomnia: Secondary | ICD-10-CM

## 2021-08-08 ENCOUNTER — Other Ambulatory Visit: Payer: Self-pay

## 2021-08-08 DIAGNOSIS — G4709 Other insomnia: Secondary | ICD-10-CM

## 2021-08-08 MED ORDER — TRAZODONE HCL 50 MG PO TABS: ORAL_TABLET | ORAL | 0 refills | Status: DC

## 2021-08-23 DIAGNOSIS — M0579 Rheumatoid arthritis with rheumatoid factor of multiple sites without organ or systems involvement: Secondary | ICD-10-CM | POA: Diagnosis not present

## 2021-08-23 DIAGNOSIS — Z79899 Other long term (current) drug therapy: Secondary | ICD-10-CM | POA: Diagnosis not present

## 2021-08-23 DIAGNOSIS — N289 Disorder of kidney and ureter, unspecified: Secondary | ICD-10-CM | POA: Diagnosis not present

## 2021-08-23 DIAGNOSIS — M159 Polyosteoarthritis, unspecified: Secondary | ICD-10-CM | POA: Diagnosis not present

## 2021-08-24 DIAGNOSIS — R809 Proteinuria, unspecified: Secondary | ICD-10-CM | POA: Diagnosis not present

## 2021-08-24 DIAGNOSIS — N184 Chronic kidney disease, stage 4 (severe): Secondary | ICD-10-CM | POA: Diagnosis not present

## 2021-08-24 DIAGNOSIS — R8290 Unspecified abnormal findings in urine: Secondary | ICD-10-CM | POA: Diagnosis not present

## 2021-08-24 DIAGNOSIS — N179 Acute kidney failure, unspecified: Secondary | ICD-10-CM | POA: Diagnosis not present

## 2021-08-24 DIAGNOSIS — M150 Primary generalized (osteo)arthritis: Secondary | ICD-10-CM | POA: Insufficient documentation

## 2021-08-24 DIAGNOSIS — N1832 Chronic kidney disease, stage 3b: Secondary | ICD-10-CM | POA: Insufficient documentation

## 2021-08-24 DIAGNOSIS — D472 Monoclonal gammopathy: Secondary | ICD-10-CM | POA: Insufficient documentation

## 2021-08-24 DIAGNOSIS — M05852 Other rheumatoid arthritis with rheumatoid factor of left hip: Secondary | ICD-10-CM | POA: Diagnosis not present

## 2021-08-24 DIAGNOSIS — E7800 Pure hypercholesterolemia, unspecified: Secondary | ICD-10-CM | POA: Diagnosis not present

## 2021-08-24 DIAGNOSIS — J449 Chronic obstructive pulmonary disease, unspecified: Secondary | ICD-10-CM | POA: Diagnosis not present

## 2021-08-24 DIAGNOSIS — I10 Essential (primary) hypertension: Secondary | ICD-10-CM | POA: Diagnosis not present

## 2021-09-04 ENCOUNTER — Other Ambulatory Visit: Payer: Self-pay | Admitting: Nephrology

## 2021-09-04 DIAGNOSIS — R8290 Unspecified abnormal findings in urine: Secondary | ICD-10-CM

## 2021-09-04 DIAGNOSIS — R809 Proteinuria, unspecified: Secondary | ICD-10-CM

## 2021-09-04 DIAGNOSIS — N184 Chronic kidney disease, stage 4 (severe): Secondary | ICD-10-CM

## 2021-09-12 ENCOUNTER — Other Ambulatory Visit: Payer: Self-pay

## 2021-09-12 ENCOUNTER — Ambulatory Visit
Admission: RE | Admit: 2021-09-12 | Discharge: 2021-09-12 | Disposition: A | Discharge status: Home / Self Care | Payer: Medicare Other | Source: Ambulatory Visit | Attending: Nephrology | Admitting: Nephrology

## 2021-09-12 DIAGNOSIS — N184 Chronic kidney disease, stage 4 (severe): Secondary | ICD-10-CM | POA: Diagnosis not present

## 2021-09-12 DIAGNOSIS — R809 Proteinuria, unspecified: Secondary | ICD-10-CM | POA: Diagnosis not present

## 2021-09-12 DIAGNOSIS — R8290 Unspecified abnormal findings in urine: Secondary | ICD-10-CM | POA: Diagnosis not present

## 2021-09-12 DIAGNOSIS — N1330 Unspecified hydronephrosis: Secondary | ICD-10-CM | POA: Diagnosis not present

## 2021-09-13 DIAGNOSIS — N1330 Unspecified hydronephrosis: Secondary | ICD-10-CM | POA: Insufficient documentation

## 2021-09-20 ENCOUNTER — Other Ambulatory Visit: Payer: Self-pay

## 2021-09-20 ENCOUNTER — Encounter: Payer: Self-pay | Admitting: Urology

## 2021-09-20 ENCOUNTER — Ambulatory Visit: Payer: Medicare Other | Admitting: Urology

## 2021-09-20 VITALS — BP 147/64 | HR 82 | Ht 61.0 in | Wt 103.0 lb

## 2021-09-20 DIAGNOSIS — R8281 Pyuria: Secondary | ICD-10-CM | POA: Diagnosis not present

## 2021-09-20 DIAGNOSIS — R399 Unspecified symptoms and signs involving the genitourinary system: Secondary | ICD-10-CM | POA: Diagnosis not present

## 2021-09-20 DIAGNOSIS — R3129 Other microscopic hematuria: Secondary | ICD-10-CM | POA: Diagnosis not present

## 2021-09-20 DIAGNOSIS — R339 Retention of urine, unspecified: Secondary | ICD-10-CM

## 2021-09-20 DIAGNOSIS — N1330 Unspecified hydronephrosis: Secondary | ICD-10-CM

## 2021-09-20 LAB — BLADDER SCAN AMB NON-IMAGING: Scan Result: 204

## 2021-09-20 NOTE — Progress Notes (Signed)
Simple Catheter Placement  Due to urinary retention patient is present today for a foley cath placement.  Patient was cleaned and prepped in a sterile fashion with betadine. A 16 FR foley catheter was inserted, urine return was noted  250ml, urine was yellow in color.  The balloon was filled with 10cc of sterile water.  A leg bag was attached for drainage.  Patient was given instruction on proper catheter care.  Patient tolerated well, no complications were noted   Performed by: Elberta Leatherwood, CMA  Additional notes/ Follow up: 7-10 days for foley removal post renal ultrasound

## 2021-09-20 NOTE — Progress Notes (Signed)
09/20/2021 10:47 AM   Alexis Owens 1941/11/06 [id number]  Referring provider: Murlean Iba, MD [address],  [place] [address]  Chief Complaint  Patient presents with   Hydronephrosis    HPI: Alexis Owens is an 80 y.o. female referred for evaluation of hydronephrosis.  Seen by nephrology in October 2022 for stage IV chronic kidney disease RUS 09/12/2021 with moderate bilateral hydronephrosis; prevoid bladder volume 371 mL and postvoid bladder volume was 232 mL Upper abdominal cuts of a chest CT 01/29/2020 showed mild fullness of the collecting systems bilaterally 1 year history of bothersome LUTS including urinary hesitancy with difficulty voiding, frequency, urgency and urge incontinence Denies dysuria or recurrent UTI Denies flank, abdominal or pelvic pain   PMH: Past Medical History:  Diagnosis Date   Allergy    Arthritis    Bunion    Chronic sinusitis    COPD (chronic obstructive pulmonary disease) (HCC)    High risk medication use    Hyperlipidemia    Hypertension    Hypothyroidism    Insomnia    Macular degeneration    Osteoporosis    Paroxysmal A-fib ([place])    Prediabetes    Rheumatoid arthritis ([place])     Surgical History: Past Surgical History:  Procedure Laterality Date   APPENDECTOMY     HIP ARTHROPLASTY Left 12/18/2017   Procedure: ARTHROPLASTY BIPOLAR HIP (HEMIARTHROPLASTY);  Surgeon: Corky Mull, MD;  Location: [facility] ORS;  Service: Orthopedics;  Laterality: Left;   LEG SURGERY     Rt leg surgery for Compound fracture of the Right tibia     Home Medications:  Allergies as of 09/20/2021       Reactions   Penicillin G Hives   Penicillins Hives   Has patient had a PCN reaction causing immediate rash, facial/tongue/throat swelling, SOB or lightheadedness with hypotension: Yes Has patient had a PCN reaction causing severe rash involving mucus membranes or skin necrosis: No Has patient had a PCN reaction that required  hospitalization: No Has patient had a PCN reaction occurring within the last 10 years: No If all of the above answers are "NO", then may proceed with Cephalosporin use.   Sulfa Antibiotics Other (See Comments)   Mouth sores Other reaction(s): Other (See Comments) Mouth sores   Sulfasalazine    Other reaction(s): Other (See Comments) Mouth sores        Medication List        Accurate as of September 20, 2021 10:47 AM. If you have any questions, ask your nurse or doctor.          STOP taking these medications    folic acid 1 MG tablet Commonly known as: FOLVITE Stopped by: Abbie Sons, MD       TAKE these medications    acetaminophen 650 MG CR tablet Commonly known as: TYLENOL Take by mouth.   albuterol 108 (90 Base) MCG/ACT inhaler Commonly known as: VENTOLIN HFA INHALE 1 TO 2 PUFFS INTO THE LUNGS EVERY 6 HOURS AS NEEDED FOR WHEEZING OR SHORTNESS OF BREATH   alendronate 70 MG tablet Commonly known as: FOSAMAX TAKE 1 TABLET BY MOUTH ONCE A WEEK, WITH A FULL GLASS OF WATER ON AN EMPTY STOMACH, REMAIN UPRIGHT FOR 30 MINUTES   diltiazem 180 MG 24 hr capsule Commonly known as: DILACOR XR Take 1 capsule (180 mg total) by mouth daily.   fluticasone 50 MCG/ACT nasal spray Commonly known as: FLONASE SHAKE LIQUID AND USE 2 SPRAYS IN EACH NOSTRIL  DAILY   furosemide 20 MG tablet Commonly known as: LASIX Take 1 tablet (20 mg total) by mouth daily as needed for fluid or edema. Short term use only 3-5 days as needed at a time.   gabapentin 100 MG capsule Commonly known as: NEURONTIN TAKE 1 CAPSULE IN MORNING AND 1 IN AFTERNOON AND 3 AT BEDTIME   hydroxychloroquine 200 MG tablet Commonly known as: PLAQUENIL Take 300 mg daily (1 tab and a half ) daily, 90 days   ibuprofen 200 MG tablet Commonly known as: ADVIL Take 400 mg by mouth 2 (two) times daily.   levothyroxine 25 MCG tablet Commonly known as: SYNTHROID TAKE 1 TABLET(25 MCG) BY MOUTH DAILY BEFORE AND  BREAKFAST   lisinopril 20 MG tablet Commonly known as: ZESTRIL Take 1 tablet by mouth daily   mupirocin ointment 2 % Commonly known as: Bactroban Apply 1 application topically 2 (two) times daily. For up to 2 weeks.   omeprazole 20 MG capsule Commonly known as: PRILOSEC TAKE 1 CAPSULE(20 MG) BY MOUTH DAILY   promethazine 12.5 MG tablet Commonly known as: PHENERGAN TAKE 1 TO 2 TABLETS(12.5 TO 25 MG) BY MOUTH EVERY 8 HOURS AS NEEDED FOR NAUSEA OR VOMITING   traMADol 50 MG tablet Commonly known as: ULTRAM Take by mouth.   traZODone 50 MG tablet Commonly known as: DESYREL TAKE 1 TABLET(50 MG) BY MOUTH AT BEDTIME   zoledronic acid 5 MG/100ML Soln injection Commonly known as: RECLAST Inject 5 mg into the vein once.        Allergies:  Allergies  Allergen Reactions   Penicillin G Hives   Penicillins Hives    Has patient had a PCN reaction causing immediate rash, facial/tongue/throat swelling, SOB or lightheadedness with hypotension: Yes Has patient had a PCN reaction causing severe rash involving mucus membranes or skin necrosis: No Has patient had a PCN reaction that required hospitalization: No Has patient had a PCN reaction occurring within the last 10 years: No If all of the above answers are "NO", then may proceed with Cephalosporin use.   Sulfa Antibiotics Other (See Comments)    Mouth sores Other reaction(s): Other (See Comments) Mouth sores   Sulfasalazine     Other reaction(s): Other (See Comments) Mouth sores    Family History: Family History  Problem Relation Age of Onset   Breast cancer Sister    Heart disease Brother    Diabetes Brother    Osteoarthritis Mother    Lung disease Father     Social History:  reports that she quit smoking about 7 years ago. Her smoking use included cigarettes. She has never used smokeless tobacco. She reports that she does not currently use alcohol after a past usage of about 14.0 standard drinks per week. She reports  that she does not use drugs.   Physical Exam: BP (!) 147/64   Pulse 82   Ht 5\' 1"  (1.549 m)   Wt 103 lb (46.7 kg)   BMI 19.46 kg/m   Constitutional:  Alert and oriented, No acute distress. HEENT: [place] AT, moist mucus membranes.  Trachea midline, no masses. Cardiovascular: No clubbing, cyanosis, or edema. Respiratory: Normal respiratory effort, no increased work of breathing. Psychiatric: Normal mood and affect.  Laboratory Data:  Urinalysis 09/20/2021: Dipstick 2+ blood,nitrite +, 3+ leukocytes/microscopy >30 WBC, 3-10 RBC, many bacteria   Pertinent Imaging: Ultrasound images were personally reviewed and interpreted  US RENAL  Narrative CLINICAL DATA:  Stage 4 chronic kidney disease  Proteinuria  Hypertension  EXAM: RENAL / URINARY TRACT ULTRASOUND COMPLETE  COMPARISON:  None.  FINDINGS: Right Kidney:  Renal measurements: 11.4 x 3.8 x 3.3 cm = volume: 74 mL. Echogenicity within normal limits. No mass. Moderate hydronephrosis.  Left Kidney:  Renal measurements: 9.9 x 4.9 x 4.3 cm = volume: 109 mL. Echogenicity within normal limits. Moderate hydronephrosis. 1.6 cm simple cyst seen in the mid kidney.  Bladder:  Appears normal for degree of bladder distention.  Other:  None.  IMPRESSION: Moderate bilateral hydronephrosis.  These results will be called to the ordering clinician or representative by the Radiologist Assistant, and communication documented in the PACS or [employer].   Electronically Signed By: Miachel Roux M.D. On: 09/13/2021 09:36   Assessment & Plan:    1.  Hydronephrosis Mild, bilateral hydronephrosis on renal ultrasound Elevated PVR bladder survey views of renal ultrasound and PVR today 203 mL We discussed her incomplete bladder emptying could be a source of her bilateral hydronephrosis Recommend Foley catheter placement x7-10 days with a repeat renal ultrasound in 5-7 days  2.  Incomplete bladder emptying As above  3.   Lower urinary tract symptoms Bothersome urinary hesitancy, frequency, urgency and urge incontinence Foley catheter drainage as above  4.  Pyuria Asymptomatic and most likely secondary to incomplete emptying Urine culture was ordered   Abbie Sons, MD  [place] [address] [place], [place] [address] [phone]

## 2021-09-21 DIAGNOSIS — J432 Centrilobular emphysema: Secondary | ICD-10-CM | POA: Diagnosis not present

## 2021-09-21 DIAGNOSIS — I10 Essential (primary) hypertension: Secondary | ICD-10-CM | POA: Diagnosis not present

## 2021-09-21 DIAGNOSIS — E785 Hyperlipidemia, unspecified: Secondary | ICD-10-CM | POA: Diagnosis not present

## 2021-09-21 DIAGNOSIS — D472 Monoclonal gammopathy: Secondary | ICD-10-CM | POA: Diagnosis not present

## 2021-09-21 DIAGNOSIS — J449 Chronic obstructive pulmonary disease, unspecified: Secondary | ICD-10-CM | POA: Diagnosis not present

## 2021-09-21 DIAGNOSIS — I480 Paroxysmal atrial fibrillation: Secondary | ICD-10-CM | POA: Diagnosis not present

## 2021-09-21 DIAGNOSIS — N184 Chronic kidney disease, stage 4 (severe): Secondary | ICD-10-CM | POA: Diagnosis not present

## 2021-09-21 DIAGNOSIS — N1330 Unspecified hydronephrosis: Secondary | ICD-10-CM | POA: Diagnosis not present

## 2021-09-21 DIAGNOSIS — R7303 Prediabetes: Secondary | ICD-10-CM | POA: Diagnosis not present

## 2021-09-21 DIAGNOSIS — D539 Nutritional anemia, unspecified: Secondary | ICD-10-CM | POA: Diagnosis not present

## 2021-09-21 DIAGNOSIS — R809 Proteinuria, unspecified: Secondary | ICD-10-CM | POA: Diagnosis not present

## 2021-09-22 LAB — URINALYSIS, COMPLETE
Bilirubin, UA: NEGATIVE
Glucose, UA: NEGATIVE
Ketones, UA: NEGATIVE
Nitrite, UA: POSITIVE — AB
Specific Gravity, UA: 1.01 (ref 1.005–1.030)
Urobilinogen, Ur: 0.2 mg/dL (ref 0.2–1.0)
pH, UA: 6 (ref 5.0–7.5)

## 2021-09-22 LAB — CULTURE, URINE COMPREHENSIVE

## 2021-09-22 LAB — MICROSCOPIC EXAMINATION: WBC, UA: >30 /hpf — AB (ref 0–5)

## 2021-09-25 ENCOUNTER — Other Ambulatory Visit: Payer: Self-pay | Admitting: Family Medicine

## 2021-09-25 DIAGNOSIS — J309 Allergic rhinitis, unspecified: Secondary | ICD-10-CM

## 2021-09-27 ENCOUNTER — Ambulatory Visit
Admission: RE | Admit: 2021-09-27 | Discharge: 2021-09-27 | Disposition: A | Discharge status: Home / Self Care | Payer: Medicare Other | Source: Ambulatory Visit | Attending: Urology | Admitting: Urology

## 2021-09-27 ENCOUNTER — Other Ambulatory Visit: Payer: Self-pay

## 2021-09-27 DIAGNOSIS — N1330 Unspecified hydronephrosis: Secondary | ICD-10-CM | POA: Diagnosis not present

## 2021-09-27 DIAGNOSIS — R339 Retention of urine, unspecified: Secondary | ICD-10-CM | POA: Insufficient documentation

## 2021-09-27 DIAGNOSIS — N281 Cyst of kidney, acquired: Secondary | ICD-10-CM | POA: Diagnosis not present

## 2021-09-28 ENCOUNTER — Encounter: Payer: Self-pay | Admitting: Urology

## 2021-09-28 ENCOUNTER — Telehealth: Payer: Self-pay | Admitting: Urology

## 2021-09-28 NOTE — Telephone Encounter (Signed)
RUS showed improved right hydronephrosis and persistent left hydronephrosis. Recc scheduling cysto with pelvic exam

## 2021-09-29 ENCOUNTER — Encounter: Payer: Self-pay | Admitting: *Deleted

## 2021-10-09 ENCOUNTER — Other Ambulatory Visit: Payer: Self-pay

## 2021-10-09 ENCOUNTER — Ambulatory Visit: Payer: Medicare Other | Admitting: Urology

## 2021-10-09 ENCOUNTER — Encounter: Payer: Self-pay | Admitting: Urology

## 2021-10-09 VITALS — BP 155/73 | HR 75 | Ht 61.0 in | Wt 103.0 lb

## 2021-10-09 DIAGNOSIS — N1330 Unspecified hydronephrosis: Secondary | ICD-10-CM

## 2021-10-09 DIAGNOSIS — R3915 Urgency of urination: Secondary | ICD-10-CM

## 2021-10-09 DIAGNOSIS — N3941 Urge incontinence: Secondary | ICD-10-CM

## 2021-10-09 DIAGNOSIS — R339 Retention of urine, unspecified: Secondary | ICD-10-CM

## 2021-10-09 MED ORDER — MIRABEGRON ER 25 MG PO TB24: 25.0000 mg | ORAL_TABLET | Freq: Every day | ORAL | 0 refills | Status: DC

## 2021-10-09 NOTE — Progress Notes (Signed)
Catheter Removal  Patient is present today for a catheter removal.  64ml of water was drained from the balloon. A 16FR foley cath was removed from the bladder no complications were noted . Patient tolerated well.  Performed by: Gaspar Cola

## 2021-10-15 ENCOUNTER — Other Ambulatory Visit: Payer: Self-pay | Admitting: Family Medicine

## 2021-10-15 DIAGNOSIS — I10 Essential (primary) hypertension: Secondary | ICD-10-CM

## 2021-10-15 DIAGNOSIS — E039 Hypothyroidism, unspecified: Secondary | ICD-10-CM

## 2021-10-15 DIAGNOSIS — G4709 Other insomnia: Secondary | ICD-10-CM

## 2021-10-15 NOTE — Telephone Encounter (Signed)
Requested Prescriptions  Pending Prescriptions Disp Refills  . levothyroxine (SYNTHROID) 25 MCG tablet [Pharmacy Med Name: LEVOTHYROXINE 0.025MG  (25MCG) TAB] 90 tablet 0    Sig: TAKE 1 TABLET(25 MCG) BY MOUTH DAILY BEFORE BREAKFAST     Endocrinology:  Hypothyroid Agents Failed - 10/15/2021  8:58 AM      Failed - TSH needs to be rechecked within 3 months after an abnormal result. Refill until TSH is due.      Passed - TSH in normal range and within 360 days    TSH  Date Value Ref Range Status  12/21/2020 0.75 0.40 - 4.50 mIU/L Final         Passed - Valid encounter within last 12 months    Recent Outpatient Visits          9 months ago Acute cystitis with hematuria   [place], DO   11 months ago [facility] [place], Lupita Raider, FNP   1 year ago Non-healing ulcer of lower leg, right, limited to breakdown of skin [facility])   [facility] Olin Hauser, DO   1 year ago MGUS (monoclonal gammopathy of unknown significance)   [place], Devonne Doughty, DO   1 year ago Essential hypertension   [place], DO      Future Appointments            In 2 months [facility], [place]            . traZODone ([place]) 50 MG tablet [Pharmacy Med Name: TRAZODONE 50MG  TABLETS] 90 tablet     Sig: TAKE 1 TABLET(50 MG) BY MOUTH AT BEDTIME     Psychiatry: Antidepressants - Serotonin Modulator Failed - 10/15/2021  8:58 AM      Failed - Valid encounter within last 6 months    Recent Outpatient Visits          9 months ago Acute cystitis with hematuria   [facility] Olin Hauser, DO   11 months ago [facility] [place], Lupita Raider, FNP   1 year ago Non-healing ulcer of lower leg, right, limited to breakdown of skin [facility])   [place], DO    1 year ago MGUS (monoclonal gammopathy of unknown significance)   [facility] Olin Hauser, DO   1 year ago Essential hypertension   [place], DO      Future Appointments            In 2 months [facility], [place]            . lisinopril (ZESTRIL) 20 MG tablet [Pharmacy Med Name: LISINOPRIL 20MG  TABLETS] 90 tablet     Sig: TAKE 1 TABLET BY MOUTH DAILY     Cardiovascular:  ACE Inhibitors Failed - 10/15/2021  8:58 AM      Failed - Cr in normal range and within 180 days    Creat  Date Value Ref Range Status  12/21/2020 0.99 (H) 0.60 - 0.93 mg/dL Final    Comment:    For patients >104 years of age, the reference limit for Creatinine is approximately 13% higher for people identified as African-American. .          Failed - K in normal range and within 180 days  Potassium  Date Value Ref Range Status  12/21/2020 4.6 3.5 - 5.3 mmol/L Final         Failed - Last BP in normal range    BP Readings from Last 1 Encounters:  10/09/21 (!) 155/73         Failed - Valid encounter within last 6 months    Recent Outpatient Visits          9 months ago Acute cystitis with hematuria   [place], DO   11 months ago [facility] [place], Lupita Raider, FNP   1 year ago Non-healing ulcer of lower leg, right, limited to breakdown of skin [facility])   [place], DO   1 year ago MGUS (monoclonal gammopathy of unknown significance)   [facility] Olin Hauser, DO   1 year ago Essential hypertension   [place], DO      Future Appointments            In 2 months [facility], [place] - Patient is not pregnant

## 2021-10-15 NOTE — Telephone Encounter (Signed)
Lisinopril: Last RF: 07/19/20  last OV 12/18/19 RF due: yes Needs office visit. Sent pt message message via MyChart to call office to schedule appt.    Trazodone: 08/08/21 Needs OV  Requested Prescriptions  Pending Prescriptions Disp Refills   traZODone (DESYREL) 50 MG tablet [Pharmacy Med Name: TRAZODONE 50MG  TABLETS] 90 tablet     Sig: TAKE 1 TABLET(50 MG) BY MOUTH AT BEDTIME     Psychiatry: Antidepressants - Serotonin Modulator Failed - 10/15/2021  8:58 AM      Failed - Valid encounter within last 6 months    Recent Outpatient Visits           9 months ago Acute cystitis with hematuria   [facility] Olin Hauser, DO   11 months ago [facility] [place], Lupita Raider, FNP   1 year ago Non-healing ulcer of lower leg, right, limited to breakdown of skin [facility])   [facility] Olin Hauser, DO   1 year ago MGUS (monoclonal gammopathy of unknown significance)   [facility] Olin Hauser, DO   1 year ago Essential hypertension   [place], DO       Future Appointments             In 2 months [facility], [facility]              lisinopril (ZESTRIL) 20 MG tablet [Pharmacy Med Name: LISINOPRIL 20MG  TABLETS] 90 tablet     Sig: TAKE 1 TABLET BY MOUTH DAILY     Cardiovascular:  ACE Inhibitors Failed - 10/15/2021  8:58 AM      Failed - Cr in normal range and within 180 days    Creat  Date Value Ref Range Status  12/21/2020 0.99 (H) 0.60 - 0.93 mg/dL Final    Comment:    For patients >13 years of age, the reference limit for Creatinine is approximately 13% higher for people identified as African-American. .           Failed - K in normal range and within 180 days    Potassium  Date Value Ref Range Status  12/21/2020 4.6 3.5 - 5.3 mmol/L Final          Failed - Last BP in normal range    BP Readings from Last 1  Encounters:  10/09/21 (!) 155/73          Failed - Valid encounter within last 6 months    Recent Outpatient Visits           9 months ago Acute cystitis with hematuria   [place], DO   11 months ago [facility] M[place]i, Lupita Raider, FNP   1 year ago Non-healing ulcer of lower leg, right, limited to breakdown of skin [facility])   [place], DO   1 year ago MGUS (monoclonal gammopathy of unknown significance)   [facility] Olin Hauser, DO   1 year ago Essential hypertension   [place], DO       Future Appointments             In 2 months [facility], [place] - Patient is not pregnant  Signed Prescriptions Disp Refills   levothyroxine (SYNTHROID) 25 MCG tablet 90 tablet 0    Sig: TAKE 1 TABLET(25 MCG) BY MOUTH DAILY BEFORE BREAKFAST     Endocrinology:  Hypothyroid Agents Failed - 10/15/2021  8:58 AM      Failed - TSH needs to be rechecked within 3 months after an abnormal result. Refill until TSH is due.      Passed - TSH in normal range and within 360 days    TSH  Date Value Ref Range Status  12/21/2020 0.75 0.40 - 4.50 mIU/L Final          Passed - Valid encounter within last 12 months    Recent Outpatient Visits           9 months ago Acute cystitis with hematuria   [place], DO   11 months ago [facility] [place], Lupita Raider, FNP   1 year ago Non-healing ulcer of lower leg, right, limited to breakdown of skin [facility])   [place], DO   1 year ago MGUS (monoclonal gammopathy of unknown significance)   [facility] Olin Hauser, DO   1 year ago Essential hypertension   [place], DO       Future Appointments             In 2 months [facility], [facility]

## 2021-10-16 ENCOUNTER — Encounter: Payer: Self-pay | Admitting: Urology

## 2021-10-16 NOTE — Progress Notes (Signed)
   10/16/21  CC:  Chief Complaint  Patient presents with   Cysto    HPI: Refer to prior note 09/20/2021  Followed by nephrology stage IV CKD RUS 09/12/2021 with moderate bilateral hydronephrosis PVR 200-300 cc Bothersome LUTS including hesitancy frequency, urgency and urge incontinence She elected 1 week Foley catheter drainage Follow-up renal ultrasound 11/16 showed improvement of right hydronephrosis which was graded mild but persistent moderate left hydronephrosis  Blood pressure (!) 155/73, pulse 75, height 5\' 1"  (1.549 m), weight 103 lb (46.7 kg). NED. A&Ox3.   No respiratory distress   Abd soft, NT, ND Atrophic external genitalia with patent urethral meatus.  No cystocele or rectocele  Cystoscopy Procedure Note  Patient identification was confirmed, informed consent was obtained, and patient was prepped using Betadine solution.  Lidocaine jelly was administered per urethral meatus.    Procedure: - Flexible cystoscope introduced, without any difficulty.   - Thorough search of the bladder revealed:    normal urethral meatus    normal urothelium    no stones    no ulcers     no tumors    no urethral polyps    no trabeculation    mild inflammatory changes bladder base secondary to indwelling Foley  - Ureteral orifices were normal in position and appearance.  Post-Procedure: - Patient tolerated the procedure well   Assessment/ Plan: No significant bladder mucosal abnormalities noted Trial Myrbetriq 25 mg daily for her voiding symptoms-samples given x28 days Schedule Lasix renogram for evaluation of hydronephrosis    Abbie Sons, MD

## 2021-10-20 ENCOUNTER — Other Ambulatory Visit: Payer: Self-pay | Admitting: Family Medicine

## 2021-10-20 DIAGNOSIS — K219 Gastro-esophageal reflux disease without esophagitis: Secondary | ICD-10-CM

## 2021-10-20 NOTE — Telephone Encounter (Signed)
Requested medication (s) are due for refill today: Yes  Requested medication (s) are on the active medication list: NO  Last refill:  08/09/21 #90/3RF  Future visit scheduled: Yes  Notes to clinic:  med was dc'd on 10/09/21, please advise     Requested Prescriptions  Pending Prescriptions Disp Refills   omeprazole (PRILOSEC) 20 MG capsule [Pharmacy Med Name: OMEPRAZOLE 20MG  CAPSULES] 90 capsule 3    Sig: TAKE 1 CAPSULE(20 MG) BY MOUTH DAILY     Gastroenterology: Proton Pump Inhibitors Passed - 10/20/2021  7:08 AM      Passed - Valid encounter within last 12 months    Recent Outpatient Visits           9 months ago Acute cystitis with hematuria   [place], DO   11 months ago [facility] [place], Lupita Raider, FNP   1 year ago Non-healing ulcer of lower leg, right, limited to breakdown of skin [facility])   [place], DO   1 year ago MGUS (monoclonal gammopathy of unknown significance)   [place], Devonne Doughty, DO   1 year ago Essential hypertension   [place], Devonne Doughty, DO       Future Appointments             In 3 days [place], Devonne Doughty, [facility], [name]   In 2 months  [facility], [facility]

## 2021-10-23 ENCOUNTER — Encounter: Payer: Self-pay | Admitting: Family Medicine

## 2021-10-23 ENCOUNTER — Ambulatory Visit (INDEPENDENT_AMBULATORY_CARE_PROVIDER_SITE_OTHER): Payer: Medicare Other | Admitting: Family Medicine

## 2021-10-23 ENCOUNTER — Other Ambulatory Visit: Payer: Self-pay

## 2021-10-23 VITALS — BP 139/59 | HR 81 | Ht 61.0 in | Wt 105.2 lb

## 2021-10-23 DIAGNOSIS — Z23 Encounter for immunization: Secondary | ICD-10-CM

## 2021-10-23 DIAGNOSIS — M0579 Rheumatoid arthritis with rheumatoid factor of multiple sites without organ or systems involvement: Secondary | ICD-10-CM | POA: Diagnosis not present

## 2021-10-23 DIAGNOSIS — E039 Hypothyroidism, unspecified: Secondary | ICD-10-CM

## 2021-10-23 DIAGNOSIS — I480 Paroxysmal atrial fibrillation: Secondary | ICD-10-CM

## 2021-10-23 DIAGNOSIS — R7303 Prediabetes: Secondary | ICD-10-CM | POA: Diagnosis not present

## 2021-10-23 DIAGNOSIS — J432 Centrilobular emphysema: Secondary | ICD-10-CM | POA: Diagnosis not present

## 2021-10-23 DIAGNOSIS — R110 Nausea: Secondary | ICD-10-CM

## 2021-10-23 DIAGNOSIS — N3946 Mixed incontinence: Secondary | ICD-10-CM

## 2021-10-23 MED ORDER — OXYBUTYNIN CHLORIDE ER 15 MG PO TB24: 15.0000 mg | ORAL_TABLET | Freq: Every day | ORAL | 3 refills | Status: DC

## 2021-10-23 MED ORDER — PROMETHAZINE HCL 12.5 MG PO TABS: 12.5000 mg | ORAL_TABLET | Freq: Three times a day (TID) | ORAL | 3 refills | Status: DC | PRN

## 2021-10-23 NOTE — Patient Instructions (Addendum)
Thank you for coming to the office today.  Refilled oxybutynin 15mg  XL and the Promethazine  Keep up with scheduled apt.  Please schedule a Follow-up Appointment to: Return in about 1 year (around 10/23/2022) for Yearly Physical.  If you have any other questions or concerns, please feel free to call the office or send a message through [place]. You may also schedule an earlier appointment if necessary.  Additionally, you may be receiving a survey about your experience at our office within a few days to 1 week by e-mail or mail. We value your feedback.  Nobie Putnam, DO [place]

## 2021-10-23 NOTE — Progress Notes (Signed)
Subjective:    Patient ID: Alexis Owens, female    DOB: 1941/01/23, 80 y.o.   MRN: [id number]  Alexis Owens is a 80 y.o. female presenting on 10/23/2021 for Nausea   HPI  Urinary Incontinence, mixed Not want to go back to Urology Ins not covering Myrbetriq She is asking about restarting Oxybutynin  Rheumatoid Arthritis OA Followed by [facility]ology Managing her Tramadol PRN pain.  CKD IV Nephrology Dr Joylene John  Health Maintenance: Due for Flu Shot, will receive today    Depression screen [facility] 2/9 12/20/2020 01/15/2020 12/18/2019  Decreased Interest 0 0 0  Down, Depressed, Hopeless 0 0 0  PHQ - 2 Score 0 0 0  Altered sleeping - - -  Tired, decreased energy - - -  Change in appetite - - -  Feeling bad or failure about yourself  - - -  Trouble concentrating - - -  Moving slowly or fidgety/restless - - -  Suicidal thoughts - - -  PHQ-9 Score - - -    Social History   Tobacco Use   Smoking status: Former    Types: Cigarettes    Quit date: 07/19/2014    Years since quitting: 7.2   Smokeless tobacco: Never  Vaping Use   Vaping Use: Never used  Substance Use Topics   Alcohol use: Not Currently    Alcohol/week: 14.0 standard drinks    Types: 14 Glasses of wine per week   Drug use: No    Review of Systems Per HPI unless specifically indicated above     Objective:    BP (!) 139/59   Pulse 81   Ht 5\' 1"  (1.549 m)   Wt 105 lb 3.2 oz (47.7 kg)   SpO2 100%   BMI 19.88 kg/m   Wt Readings from Last 3 Encounters:  10/23/21 105 lb 3.2 oz (47.7 kg)  10/09/21 103 lb (46.7 kg)  09/20/21 103 lb (46.7 kg)    Physical Exam Vitals and nursing note reviewed.  Constitutional:      General: She is not in acute distress.    Appearance: Normal appearance. She is well-developed. She is not diaphoretic.     Comments: Well-appearing, comfortable, cooperative  HENT:     Head: Normocephalic and atraumatic.  Eyes:     General:        Right eye: No discharge.         Left eye: No discharge.     Conjunctiva/sclera: Conjunctivae normal.  Cardiovascular:     Rate and Rhythm: Normal rate.  Pulmonary:     Effort: Pulmonary effort is normal.  Skin:    General: Skin is warm and dry.     Findings: No erythema or rash.  Neurological:     Mental Status: She is alert and oriented to person, place, and time.  Psychiatric:        Mood and Affect: Mood normal.        Behavior: Behavior normal.        Thought Content: Thought content normal.     Comments: Well groomed, good eye contact, normal speech and thoughts      Results for orders placed or performed in visit on 09/20/21  CULTURE, URINE COMPREHENSIVE   Specimen: Urine   UR  Result Value Ref Range   Urine Culture, Comprehensive Final report    Organism ID, Bacteria Comment   Microscopic Examination   Urine  Result Value Ref Range   WBC, UA >30 (A) 0 -  5 /hpf   RBC 3-10 (A) 0 - 2 /hpf   Epithelial Cells (non renal) 0-10 0 - 10 /hpf   Bacteria, UA Many (A) None seen/Few  Urinalysis, Complete  Result Value Ref Range   Specific Gravity, UA 1.010 1.005 - 1.030   pH, UA 6.0 5.0 - 7.5   Color, UA Straw Yellow   Appearance Ur Cloudy (A) Clear   Leukocytes,UA 3+ (A) Negative   Protein,UA Trace (A) Negative/Trace   Glucose, UA Negative Negative   Ketones, UA Negative Negative   RBC, UA 2+ (A) Negative   Bilirubin, UA Negative Negative   Urobilinogen, Ur 0.2 0.2 - 1.0 mg/dL   Nitrite, UA Positive (A) Negative   Microscopic Examination See below:   Bladder Scan (Post Void Residual) in office  Result Value Ref Range   Scan Result 204       Assessment & Plan:   Problem List Items Addressed This Visit     Prediabetes   Paroxysmal A-fib (HCC)   Hypothyroidism   Centrilobular emphysema (HCC) - Primary   Relevant Medications   promethazine (PHENERGAN) 12.5 MG tablet   Other Visit Diagnoses     Rheumatoid arthritis involving multiple sites with positive rheumatoid factor (HCC)       Mixed  stress and urge urinary incontinence       Relevant Medications   oxybutynin (DITROPAN XL) 15 MG 24 hr tablet   Nausea       Relevant Medications   promethazine (PHENERGAN) 12.5 MG tablet   Needs flu shot       Relevant Orders   Flu Vaccine QUAD High Dose(Fluad) (Completed)       Flu Shot today  Urinary Incontinence No longer plans to follow with [facility] Off Myrbetriq due to cost Restart Oxybutynin XL  Chronic Nausea Promethazine PRN refill  RA Followed by Rheumatology  Meds ordered this encounter  Medications   oxybutynin (DITROPAN XL) 15 MG 24 hr tablet    Sig: Take 1 tablet (15 mg total) by mouth at bedtime.    Dispense:  90 tablet    Refill:  3   promethazine (PHENERGAN) 12.5 MG tablet    Sig: Take 1-2 tablets (12.5-25 mg total) by mouth every 8 (eight) hours as needed for nausea or vomiting.    Dispense:  30 tablet    Refill:  3      Follow up plan: Return in about 1 year (around 10/23/2022) for Yearly Physical.   Nobie Putnam, DO [facility] 10/23/2021, 2:02 PM

## 2021-11-24 DIAGNOSIS — M792 Neuralgia and neuritis, unspecified: Secondary | ICD-10-CM | POA: Diagnosis not present

## 2021-11-24 DIAGNOSIS — M0579 Rheumatoid arthritis with rheumatoid factor of multiple sites without organ or systems involvement: Secondary | ICD-10-CM | POA: Insufficient documentation

## 2021-11-24 DIAGNOSIS — Z79899 Other long term (current) drug therapy: Secondary | ICD-10-CM | POA: Diagnosis not present

## 2021-11-24 DIAGNOSIS — M159 Polyosteoarthritis, unspecified: Secondary | ICD-10-CM | POA: Diagnosis not present

## 2021-11-28 ENCOUNTER — Other Ambulatory Visit: Payer: Self-pay | Admitting: Family Medicine

## 2021-11-28 DIAGNOSIS — M792 Neuralgia and neuritis, unspecified: Secondary | ICD-10-CM

## 2021-11-28 NOTE — Telephone Encounter (Signed)
Requested Prescriptions  Pending Prescriptions Disp Refills   gabapentin (NEURONTIN) 100 MG capsule [Pharmacy Med Name: GABAPENTIN 100MG  CAPSULES] 450 capsule 1    Sig: TAKE ONE CAPSULE BY MOUTH EVERY MORNING, 1 CAPSULE EVERY AFTERNOON AND 3 CAPSULES AT BEDTIME     Neurology: Anticonvulsants - gabapentin Passed - 11/28/2021  8:12 AM      Passed - Valid encounter within last 12 months    Recent Outpatient Visits          1 month ago Centrilobular emphysema [facility])   [facility] Olin Hauser, DO   11 months ago Acute cystitis with hematuria   [place], DO   1 year ago [place], FNP   1 year ago Non-healing ulcer of lower leg, right, limited to breakdown of skin [facility])   [place], DO   1 year ago MGUS (monoclonal gammopathy of unknown significance)   [facility] [place], Devonne Doughty, DO      Future Appointments            In 4 weeks [facility], [facility]

## 2021-12-26 ENCOUNTER — Ambulatory Visit: Payer: Medicare Other

## 2021-12-28 DIAGNOSIS — E785 Hyperlipidemia, unspecified: Secondary | ICD-10-CM | POA: Diagnosis not present

## 2021-12-28 DIAGNOSIS — E039 Hypothyroidism, unspecified: Secondary | ICD-10-CM | POA: Diagnosis not present

## 2021-12-28 DIAGNOSIS — E7800 Pure hypercholesterolemia, unspecified: Secondary | ICD-10-CM | POA: Diagnosis not present

## 2021-12-28 DIAGNOSIS — R7303 Prediabetes: Secondary | ICD-10-CM | POA: Diagnosis not present

## 2021-12-28 DIAGNOSIS — J449 Chronic obstructive pulmonary disease, unspecified: Secondary | ICD-10-CM | POA: Diagnosis not present

## 2021-12-28 DIAGNOSIS — M059 Rheumatoid arthritis with rheumatoid factor, unspecified: Secondary | ICD-10-CM | POA: Diagnosis not present

## 2021-12-28 DIAGNOSIS — R809 Proteinuria, unspecified: Secondary | ICD-10-CM | POA: Diagnosis not present

## 2021-12-28 DIAGNOSIS — I480 Paroxysmal atrial fibrillation: Secondary | ICD-10-CM | POA: Diagnosis not present

## 2021-12-28 DIAGNOSIS — I10 Essential (primary) hypertension: Secondary | ICD-10-CM | POA: Diagnosis not present

## 2021-12-28 DIAGNOSIS — N184 Chronic kidney disease, stage 4 (severe): Secondary | ICD-10-CM | POA: Diagnosis not present

## 2021-12-28 DIAGNOSIS — M150 Primary generalized (osteo)arthritis: Secondary | ICD-10-CM | POA: Diagnosis not present

## 2021-12-28 DIAGNOSIS — D472 Monoclonal gammopathy: Secondary | ICD-10-CM | POA: Diagnosis not present

## 2022-01-05 ENCOUNTER — Other Ambulatory Visit: Payer: Self-pay

## 2022-01-05 ENCOUNTER — Ambulatory Visit (INDEPENDENT_AMBULATORY_CARE_PROVIDER_SITE_OTHER): Payer: Medicare Other

## 2022-01-05 VITALS — BP 130/70 | HR 62 | Temp 97.9°F | Wt 109.8 lb

## 2022-01-05 DIAGNOSIS — Z0000 Encounter for general adult medical examination without abnormal findings: Secondary | ICD-10-CM

## 2022-01-05 NOTE — Patient Instructions (Signed)
Alexis Owens , Thank you for taking time to come for your Medicare Wellness Visit. I appreciate your ongoing commitment to your health goals. Please review the following plan we discussed and let me know if I can assist you in the future.   Screening recommendations/referrals: Colonoscopy: aged out Mammogram: aged out Bone Density: 12/17/16, declined referral Recommended yearly ophthalmology/optometry visit for glaucoma screening and checkup Recommended yearly dental visit for hygiene and checkup  Vaccinations: Influenza vaccine: 10/23/21 Pneumococcal vaccine: n/d Tdap vaccine: 06/12/19 Shingles vaccine: n/d   Covid-19:02/19/20, 03/01/20, 03/31/20, 10/11/20  Advanced directives: no  Conditions/risks identified: none  Next appointment: Follow up in one year for your annual wellness visit - declined   Preventive Care 81 Years and Older, Female Preventive care refers to lifestyle choices and visits with your health care provider that can promote health and wellness. What does preventive care include? A yearly physical exam. This is also called an annual well check. Dental exams once or twice a year. Routine eye exams. Ask your health care provider how often you should have your eyes checked. Personal lifestyle choices, including: Daily care of your teeth and gums. Regular physical activity. Eating a healthy diet. Avoiding tobacco and drug use. Limiting alcohol use. Practicing safe sex. Taking low-dose aspirin every day. Taking vitamin and mineral supplements as recommended by your health care provider. What happens during an annual well check? The services and screenings done by your health care provider during your annual well check will depend on your age, overall health, lifestyle risk factors, and family history of disease. Counseling  Your health care provider may ask you questions about your: Alcohol use. Tobacco use. Drug use. Emotional well-being. Home and relationship  well-being. Sexual activity. Eating habits. History of falls. Memory and ability to understand (cognition). Work and work Statistician. Reproductive health. Screening  You may have the following tests or measurements: Height, weight, and BMI. Blood pressure. Lipid and cholesterol levels. These may be checked every 5 years, or more frequently if you are over 85 years old. Skin check. Lung cancer screening. You may have this screening every year starting at age 81 if you have a 30-pack-year history of smoking and currently smoke or have quit within the past 15 years. Fecal occult blood test (FOBT) of the stool. You may have this test every year starting at age 59. Flexible sigmoidoscopy or colonoscopy. You may have a sigmoidoscopy every 5 years or a colonoscopy every 10 years starting at age 6. Hepatitis C blood test. Hepatitis B blood test. Sexually transmitted disease (STD) testing. Diabetes screening. This is done by checking your blood sugar (glucose) after you have not eaten for a while (fasting). You may have this done every 1-3 years. Bone density scan. This is done to screen for osteoporosis. You may have this done starting at age 20. Mammogram. This may be done every 1-2 years. Talk to your health care provider about how often you should have regular mammograms. Talk with your health care provider about your test results, treatment options, and if necessary, the need for more tests. Vaccines  Your health care provider may recommend certain vaccines, such as: Influenza vaccine. This is recommended every year. Tetanus, diphtheria, and acellular pertussis (Tdap, Td) vaccine. You may need a Td booster every 10 years. Zoster vaccine. You may need this after age 32. Pneumococcal 13-valent conjugate (PCV13) vaccine. One dose is recommended after age 16. Pneumococcal polysaccharide (PPSV23) vaccine. One dose is recommended after age 37. Talk to your health  care provider about which  screenings and vaccines you need and how often you need them. This information is not intended to replace advice given to you by your health care provider. Make sure you discuss any questions you have with your health care provider. Document Released: 11/25/2015 Document Revised: 07/18/2016 Document Reviewed: 08/30/2015 Elsevier Interactive Patient Education  2017 [place] Prevention in the Home Falls can cause injuries. They can happen to people of all ages. There are many things you can do to make your home safe and to help prevent falls. What can I do on the outside of my home? Regularly fix the edges of walkways and driveways and fix any cracks. Remove anything that might make you trip as you walk through a door, such as a raised step or threshold. Trim any bushes or trees on the path to your home. Use bright outdoor lighting. Clear any walking paths of anything that might make someone trip, such as rocks or tools. Regularly check to see if handrails are loose or broken. Make sure that both sides of any steps have handrails. Any raised decks and porches should have guardrails on the edges. Have any leaves, snow, or ice cleared regularly. Use sand or salt on walking paths during winter. Clean up any spills in your garage right away. This includes oil or grease spills. What can I do in the bathroom? Use night lights. Install grab bars by the toilet and in the tub and shower. Do not use towel bars as grab bars. Use non-skid mats or decals in the tub or shower. If you need to sit down in the shower, use a plastic, non-slip stool. Keep the floor dry. Clean up any water that spills on the floor as soon as it happens. Remove soap buildup in the tub or shower regularly. Attach bath mats securely with double-sided non-slip rug tape. Do not have throw rugs and other things on the floor that can make you trip. What can I do in the bedroom? Use night lights. Make sure that you have a  light by your bed that is easy to reach. Do not use any sheets or blankets that are too big for your bed. They should not hang down onto the floor. Have a firm chair that has side arms. You can use this for support while you get dressed. Do not have throw rugs and other things on the floor that can make you trip. What can I do in the kitchen? Clean up any spills right away. Avoid walking on wet floors. Keep items that you use a lot in easy-to-reach places. If you need to reach something above you, use a strong step stool that has a grab bar. Keep electrical cords out of the way. Do not use floor polish or wax that makes floors slippery. If you must use wax, use non-skid floor wax. Do not have throw rugs and other things on the floor that can make you trip. What can I do with my stairs? Do not leave any items on the stairs. Make sure that there are handrails on both sides of the stairs and use them. Fix handrails that are broken or loose. Make sure that handrails are as long as the stairways. Check any carpeting to make sure that it is firmly attached to the stairs. Fix any carpet that is loose or worn. Avoid having throw rugs at the top or bottom of the stairs. If you do have throw rugs, attach them to  the floor with carpet tape. Make sure that you have a light switch at the top of the stairs and the bottom of the stairs. If you do not have them, ask someone to add them for you. What else can I do to help prevent falls? Wear shoes that: Do not have high heels. Have rubber bottoms. Are comfortable and fit you well. Are closed at the toe. Do not wear sandals. If you use a stepladder: Make sure that it is fully opened. Do not climb a closed stepladder. Make sure that both sides of the stepladder are locked into place. Ask someone to hold it for you, if possible. Clearly mark and make sure that you can see: Any grab bars or handrails. First and last steps. Where the edge of each step  is. Use tools that help you move around (mobility aids) if they are needed. These include: Canes. Walkers. Scooters. Crutches. Turn on the lights when you go into a dark area. Replace any light bulbs as soon as they burn out. Set up your furniture so you have a clear path. Avoid moving your furniture around. If any of your floors are uneven, fix them. If there are any pets around you, be aware of where they are. Review your medicines with your doctor. Some medicines can make you feel dizzy. This can increase your chance of falling. Ask your doctor what other things that you can do to help prevent falls. This information is not intended to replace advice given to you by your health care provider. Make sure you discuss any questions you have with your health care provider. Document Released: 08/25/2009 Document Revised: 04/05/2016 Document Reviewed: 12/03/2014 Elsevier Interactive Patient Education  2017 [employer].

## 2022-01-05 NOTE — Progress Notes (Signed)
Subjective:   Alexis Owens is a 81 y.o. female who presents for Medicare Annual (Subsequent) preventive examination.  Review of Systems           Objective:    Today's Vitals   01/05/22 0856  BP: 130/70  Pulse: 62  Temp: 97.9 F (36.6 C)  TempSrc: Oral  SpO2: 100%  Weight: 109 lb 12.8 oz (49.8 kg)   Body mass index is 20.75 kg/m.  Advanced Directives 01/17/2021 01/09/2021 01/03/2021 12/20/2020 08/05/2020 04/29/2020 01/14/2020  Does Patient Have a Medical Advance Directive? Yes No Yes Yes No Yes Yes  Type of Advance Directive Living will - Living will Living will;Out of facility DNR (pink MOST or yellow form) - Living will Living will  Would patient like information on creating a medical advance directive? - - - - No - Patient declined - -    Current Medications (verified) Outpatient Encounter Medications as of 01/05/2022  Medication Sig   diltiazem (TIAZAC) 180 MG 24 hr capsule Take by mouth Take 1 capsule (180 mg total) by mouth daily.  Takes this sometimes.   hydroxychloroquine (PLAQUENIL) 200 MG tablet Take 1.5 tablets by mouth daily.   lisinopril (ZESTRIL) 20 MG tablet Take 1 tablet by mouth daily.   promethazine (PHENERGAN) 12.5 MG tablet Take by mouth.   traMADol (ULTRAM) 50 MG tablet Take by mouth.   acetaminophen (TYLENOL) 650 MG CR tablet Take by mouth.   albuterol (VENTOLIN HFA) 108 (90 Base) MCG/ACT inhaler INHALE 1 TO 2 PUFFS INTO THE LUNGS EVERY 6 HOURS AS NEEDED FOR WHEEZING OR SHORTNESS OF BREATH   alendronate (FOSAMAX) 70 MG tablet TAKE 1 TABLET BY MOUTH ONCE A WEEK, WITH A FULL GLASS OF WATER ON AN EMPTY STOMACH, REMAIN UPRIGHT FOR 30 MINUTES   diltiazem (DILACOR XR) 180 MG 24 hr capsule Take 1 capsule (180 mg total) by mouth daily.   fluticasone (FLONASE) 50 MCG/ACT nasal spray SHAKE LIQUID AND USE 2 SPRAYS IN EACH NOSTRIL DAILY   gabapentin (NEURONTIN) 100 MG capsule TAKE ONE CAPSULE BY MOUTH EVERY MORNING, 1 CAPSULE EVERY AFTERNOON AND 3 CAPSULES AT BEDTIME    hydroxychloroquine (PLAQUENIL) 200 MG tablet Take 300 mg daily (1 tab and a half ) daily, 90 days   levothyroxine (SYNTHROID) 25 MCG tablet TAKE 1 TABLET(25 MCG) BY MOUTH DAILY BEFORE BREAKFAST   lisinopril (ZESTRIL) 20 MG tablet TAKE 1 TABLET BY MOUTH DAILY   mupirocin ointment (BACTROBAN) 2 % Apply 1 application topically 2 (two) times daily. For up to 2 weeks.   omeprazole (PRILOSEC) 20 MG capsule TAKE 1 CAPSULE(20 MG) BY MOUTH DAILY   oxybutynin (DITROPAN XL) 15 MG 24 hr tablet Take 1 tablet (15 mg total) by mouth at bedtime.   oxybutynin (DITROPAN-XL) 10 MG 24 hr tablet Take by mouth.   promethazine (PHENERGAN) 12.5 MG tablet Take 1-2 tablets (12.5-25 mg total) by mouth every 8 (eight) hours as needed for nausea or vomiting.   traMADol (ULTRAM) 50 MG tablet Take by mouth.   traZODone (DESYREL) 50 MG tablet TAKE 1 TABLET(50 MG) BY MOUTH AT BEDTIME   No facility-administered encounter medications on file as of 01/05/2022.    Allergies (verified) Penicillin g, Penicillins, Sulfa antibiotics, and Sulfasalazine   History: Past Medical History:  Diagnosis Date   Allergy    Arthritis    Bunion    Chronic sinusitis    COPD (chronic obstructive pulmonary disease) (HCC)    High risk medication use    Hyperlipidemia  Hypertension    Hypothyroidism    Insomnia    Macular degeneration    Osteoporosis    Paroxysmal A-fib (HCC)    Prediabetes    Rheumatoid arthritis ([place])    Past Surgical History:  Procedure Laterality Date   APPENDECTOMY     HIP ARTHROPLASTY Left 12/18/2017   Procedure: ARTHROPLASTY BIPOLAR HIP (HEMIARTHROPLASTY);  Surgeon: Corky Mull, MD;  Location: [facility] ORS;  Service: Orthopedics;  Laterality: Left;   LEG SURGERY     Rt leg surgery for Compound fracture of the Right tibia    Family History  Problem Relation Age of Onset   Breast cancer Sister    Heart disease Brother    Diabetes Brother    Osteoarthritis Mother    Lung disease Father    Social  History   Socioeconomic History   Marital status: Married    Spouse name: Not on file   Number of children: Not on file   Years of education: Not on file   Highest education level: Not on file  Occupational History   Occupation: retired  Tobacco Use   Smoking status: Former    Types: Cigarettes    Quit date: 07/19/2014    Years since quitting: 7.4   Smokeless tobacco: Never  Vaping Use   Vaping Use: Never used  Substance and Sexual Activity   Alcohol use: Not Currently    Alcohol/week: 14.0 standard drinks    Types: 14 Glasses of wine per week   Drug use: No   Sexual activity: Not on file  Other Topics Concern   Not on file  Social History Narrative   Not on file   Social Determinants of Health   Financial Resource Strain: Not on file  Food Insecurity: Not on file  Transportation Needs: Not on file  Physical Activity: Not on file  Stress: Not on file  Social Connections: Not on file    Tobacco Counseling Counseling given: Not Answered   Clinical Intake:  Pre-visit preparation completed: Yes  Pain : No/denies pain     Nutritional Risks: None Diabetes: No  How often do you need to have someone help you when you read instructions, pamphlets, or other written materials from your doctor or pharmacy?: 1 - Never  Diabetic?no  Interpreter Needed?: No  Information entered by :: Kirke Shaggy, LPN   Activities of Daily Living In your present state of health, do you have any difficulty performing the following activities: 01/09/2021  Hearing? N  Vision? N  Difficulty concentrating or making decisions? N  Walking or climbing stairs? N  Dressing or bathing? N  Some recent data might be hidden    Patient Care Team: Olin Hauser, DO as PCP - General (Family Medicine)  Indicate any recent Medical Services you may have received from other than [facility] providers in the past year (date may be approximate).     Assessment:   This is a routine wellness  examination for [name].  Hearing/Vision screen No results found.  Dietary issues and exercise activities discussed:     Goals Addressed   None    Depression Screen PHQ 2/9 Scores 12/20/2020 01/15/2020 12/18/2019 05/11/2019 09/29/2018 07/19/2017  PHQ - 2 Score 0 0 0 0 0 0  PHQ- 9 Score - - - - - 0    Fall Risk Fall Risk  12/20/2020 01/15/2020 12/18/2019 05/11/2019 12/27/2017  Falls in the past year? 1 0 0 0 Yes  Comment tripped over dog - - - -  Number falls in past yr: 0 0 0 - -  Injury with Fall? 1 0 0 - -  Comment broke shoulder - - - -  Risk for fall due to : Impaired balance/gait;Medication side effect;Impaired mobility - - - Impaired balance/gait  Follow up Falls evaluation completed;Education provided;Falls prevention discussed Falls evaluation completed Falls evaluation completed Falls evaluation completed -    FALL RISK PREVENTION PERTAINING TO THE HOME:  Any stairs in or around the home? Yes  If so, are there any without handrails? No  Home free of loose throw rugs in walkways, pet beds, electrical cords, etc? Yes  Adequate lighting in your home to reduce risk of falls? Yes   ASSISTIVE DEVICES UTILIZED TO PREVENT FALLS:  Life alert? No  Use of a cane, walker or w/c? Yes  Grab bars in the bathroom? Yes  Shower chair or bench in shower? No  Elevated toilet seat or a handicapped toilet? Yes   TIMED UP AND GO:  Was the test performed? Yes .  Length of time to ambulate 10 feet: 4 sec.   Gait steady and fast without use of assistive device  Cognitive Function:     6CIT Screen 12/20/2020  What Year? 0 points  What month? 0 points  What time? 0 points  Count back from 20 2 points  Months in reverse 4 points  Repeat phrase 2 points  Total Score 8    Immunizations Immunization History  Administered Date(s) Administered   Fluad Quad(high Dose 65+) 12/18/2019, 09/26/2020, 10/23/2021   Influenza,inj,Quad PF,6+ Mos 08/09/2018   Influenza-Unspecified 09/04/2017, 08/09/2018    PFIZER Comirnaty(Gray Top)Covid-19 Tri-Sucrose Vaccine 03/01/2020, 03/31/2020   PFIZER(Purple Top)SARS-COV-2 Vaccination 02/19/2020, 03/01/2020, 03/11/2020, 03/31/2020, 10/11/2020   Tdap 06/12/2019    TDAP status: Up to date  Flu Vaccine status: Up to date  Pneumococcal vaccine status: Declined,  Education has been provided regarding the importance of this vaccine but patient still declined. Advised may receive this vaccine at local pharmacy or Health Dept. Aware to provide a copy of the vaccination record if obtained from local pharmacy or Health Dept. Verbalized acceptance and understanding.   Covid-19 vaccine status: Completed vaccines  Qualifies for Shingles Vaccine? Yes   Zostavax completed No   Shingrix Completed?: No.    Education has been provided regarding the importance of this vaccine. Patient has been advised to call insurance company to determine out of pocket expense if they have not yet received this vaccine. Advised may also receive vaccine at local pharmacy or Health Dept. Verbalized acceptance and understanding.  Screening Tests Health Maintenance  Topic Date Due   Pneumonia Vaccine 50+ Years old (1 - PCV) Never done   Zoster Vaccines- Shingrix (1 of 2) Never done   TETANUS/TDAP  06/11/2029   INFLUENZA VACCINE  Completed   DEXA SCAN  Completed   COVID-19 Vaccine  Completed   HPV VACCINES  Aged Out    Health Maintenance  Health Maintenance Due  Topic Date Due   Pneumonia Vaccine 69+ Years old (1 - PCV) Never done   Zoster Vaccines- Shingrix (1 of 2) Never done    Colorectal cancer screening: No longer required.   Mammogram status: No longer required due to age.   Bone Density status: Completed 12/17/16. Results reflect: Bone density results: NORMAL. Repeat every 5 years.- declined referral  Lung Cancer Screening: (Low Dose CT Chest recommended if Age 84-80 years, 30 pack-year currently smoking OR have quit w/in 15years.) does qualify.   Lung Cancer  Screening Referral: declined referral  Additional Screening:  Hepatitis C Screening: does not qualify; Completed no  Vision Screening: Recommended annual ophthalmology exams for early detection of glaucoma and other disorders of the eye. Is the patient up to date with their annual eye exam?  Yes  Who is the provider or what is the name of the office in which the patient attends annual eye exams? Doesn't know the name If pt is not established with a provider, would they like to be referred to a provider to establish care? No .   Dental Screening: Recommended annual dental exams for proper oral hygiene  Community Resource Referral / Chronic Care Management: CRR required this visit?  No   CCM required this visit?  No      Plan:     I have personally reviewed and noted the following in the patients chart:   Medical and social history Use of alcohol, tobacco or illicit drugs  Current medications and supplements including opioid prescriptions.  Functional ability and status Nutritional status Physical activity Advanced directives List of other physicians Hospitalizations, surgeries, and ER visits in previous 12 months Vitals Screenings to include cognitive, depression, and falls Referrals and appointments  In addition, I have reviewed and discussed with patient certain preventive protocols, quality metrics, and best practice recommendations. A written personalized care plan for preventive services as well as general preventive health recommendations were provided to patient.     Dionisio David, LPN   02/18/8118   Nurse Notes: none

## 2022-01-10 ENCOUNTER — Other Ambulatory Visit: Payer: Self-pay | Admitting: Family Medicine

## 2022-01-10 DIAGNOSIS — E039 Hypothyroidism, unspecified: Secondary | ICD-10-CM

## 2022-01-10 NOTE — Telephone Encounter (Signed)
Requested medications are due for refill today.  yes ? ?Requested medications are on the active medications list.  yes ? ?Last refill. 10/15/2021 #90 0 refills ? ?Future visit scheduled.   no ? ?Notes to clinic.  Failed protocol d/t expired labs. ? ? ? ?Requested Prescriptions  ?Pending Prescriptions Disp Refills  ? levothyroxine (SYNTHROID) 25 MCG tablet [Pharmacy Med Name: LEVOTHYROXINE 0.025MG  (25MCG) TAB] 90 tablet 0  ?  Sig: TAKE 1 TABLET(25 MCG) BY MOUTH DAILY BEFORE BREAKFAST  ?  ? Endocrinology:  Hypothyroid Agents Failed - 01/10/2022  7:09 AM  ?  ?  Failed - TSH in normal range and within 360 days  ?  TSH  ?Date Value Ref Range Status  ?12/21/2020 0.75 0.40 - 4.50 mIU/L Final  ?  ?  ?  ?  Passed - Valid encounter within last 12 months  ?  Recent Outpatient Visits   ? ?      ? 2 months ago Centrilobular emphysema ([place])  ? [place], DO  ? 1 year ago Acute cystitis with hematuria  ? [place], DO  ? 1 year ago Dysuria  ? [place], FNP  ? 1 year ago Non-healing ulcer of lower leg, right, limited to breakdown of skin ([place])  ? [place], DO  ? 1 year ago MGUS (monoclonal gammopathy of unknown significance)  ? [place], DO  ? ?  ?  ? ?  ?  ?  ?  ?

## 2022-01-13 ENCOUNTER — Other Ambulatory Visit: Payer: Self-pay | Admitting: Family Medicine

## 2022-01-13 DIAGNOSIS — R059 Cough, unspecified: Secondary | ICD-10-CM

## 2022-01-15 NOTE — Telephone Encounter (Signed)
Requested Prescriptions  ?Pending Prescriptions Disp Refills  ?? albuterol (VENTOLIN HFA) 108 (90 Base) MCG/ACT inhaler [Pharmacy Med Name: ALBUTEROL HFA INH (200 PUFFS)8.5GM] 8.5 g 2  ?  Sig: INHALE 1 TO 2 PUFFS INTO THE LUNGS Q6H AS NEEDED FOR WHEEZING OR SHORTNESS OF BREATH  ?  ? Pulmonology:  Beta Agonists 2 Passed - 01/13/2022  4:44 PM  ?  ?  Passed - Last BP in normal range  ?  BP Readings from Last 1 Encounters:  ?01/05/22 130/70  ?   ?  ?  Passed - Last Heart Rate in normal range  ?  Pulse Readings from Last 1 Encounters:  ?01/05/22 62  ?   ?  ?  Passed - Valid encounter within last 12 months  ?  Recent Outpatient Visits   ?      ? 2 months ago Centrilobular emphysema ([place])  ? [place], DO  ? 1 year ago Acute cystitis with hematuria  ? [place], DO  ? 1 year ago Dysuria  ? [place], FNP  ? 1 year ago Non-healing ulcer of lower leg, right, limited to breakdown of skin ([place])  ? [place], DO  ? 2 years ago MGUS (monoclonal gammopathy of unknown significance)  ? [place], DO  ?  ?  ? ?  ?  ?  ? ?

## 2022-01-31 ENCOUNTER — Encounter: Payer: Self-pay | Admitting: Family Medicine

## 2022-02-08 ENCOUNTER — Ambulatory Visit (INDEPENDENT_AMBULATORY_CARE_PROVIDER_SITE_OTHER): Payer: Medicare Other | Admitting: Family Medicine

## 2022-02-08 ENCOUNTER — Encounter: Payer: Self-pay | Admitting: Family Medicine

## 2022-02-08 VITALS — BP 118/68 | HR 73 | Ht 61.0 in | Wt 108.4 lb

## 2022-02-08 DIAGNOSIS — M059 Rheumatoid arthritis with rheumatoid factor, unspecified: Secondary | ICD-10-CM | POA: Diagnosis not present

## 2022-02-08 DIAGNOSIS — G4709 Other insomnia: Secondary | ICD-10-CM | POA: Diagnosis not present

## 2022-02-08 DIAGNOSIS — E039 Hypothyroidism, unspecified: Secondary | ICD-10-CM

## 2022-02-08 DIAGNOSIS — I10 Essential (primary) hypertension: Secondary | ICD-10-CM | POA: Diagnosis not present

## 2022-02-08 DIAGNOSIS — I480 Paroxysmal atrial fibrillation: Secondary | ICD-10-CM | POA: Diagnosis not present

## 2022-02-08 DIAGNOSIS — R7303 Prediabetes: Secondary | ICD-10-CM | POA: Diagnosis not present

## 2022-02-08 DIAGNOSIS — Z23 Encounter for immunization: Secondary | ICD-10-CM | POA: Diagnosis not present

## 2022-02-08 MED ORDER — DILTIAZEM HCL ER COATED BEADS 120 MG PO CP24: 120.0000 mg | ORAL_CAPSULE | Freq: Every day | ORAL | 3 refills | Status: DC

## 2022-02-08 NOTE — Progress Notes (Signed)
? ?Subjective:  ? ? Patient ID: Alexis Owens, female    DOB: 1941/06/02, 81 y.o.   MRN: [id number] ? ?Alexis Owens is a 81 y.o. female presenting on 02/08/2022 for Hypertension ? ? ?HPI ? ?Urinary Incontinence, mixed ?Not want to go back to Urology ?Ins not covering Myrbetriq ?She is asking about restarting Oxybutynin ?  ?Rheumatoid Arthritis ?OA ?Followed by [facility] ?Managing her Tramadol PRN pain. ?On Plaquenil ?  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 ?Hypothyroidism ?Due for lab, last was 1 year ago 2022. ?Continues on Levothyroxine ? ?Atrial Fibrillation ?S/p Ablation success for 10 yr w/o medication ?Has had relapse ? ?Side effects on Oxybutynin in past, she request to discontinue. ? ? ? ? ?  02/08/2022  ?  8:27 AM 01/05/2022  ?  9:02 AM 12/20/2020  ?  9:54 AM  ?Depression screen PHQ 2/9  ?Decreased Interest 0 0 0  ?Down, Depressed, Hopeless 0 0 0  ?PHQ - 2 Score 0 0 0  ?Altered sleeping 1    ?Tired, decreased energy 1    ?Change in appetite 0    ?Feeling bad or failure about yourself  0    ?Trouble concentrating 0    ?Moving slowly or fidgety/restless 0    ?Suicidal thoughts 0    ?PHQ-9 Score 2    ?Difficult doing work/chores Not difficult at all    ? ? ?Social History  ? ?Tobacco Use  ? Smoking status: Former  ?  Types: Cigarettes  ?  Quit date: 07/19/2014  ?  Years since quitting: 7.5  ? Smokeless tobacco: Never  ?Vaping Use  ? Vaping Use: Never used  ?Substance Use Topics  ? Alcohol use: Not Currently  ?  Alcohol/week: 14.0 standard drinks  ?  Types: 14 Glasses of wine per week  ? Drug use: No  ? ? ?Review of Systems ?Per HPI unless specifically indicated above ? ?   ?Objective:  ?  ?BP 118/68   Pulse 73   Ht '5\' 1"'$  (1.549 m)   Wt 108 lb 6.4 oz (49.2 kg)   SpO2 98%   BMI 20.48 kg/m?   ?Wt Readings from Last 3 Encounters:  ?02/08/22 108 lb 6.4 oz (49.2 kg)  ?01/05/22 109 lb 12.8 oz (49.8 kg)   ?10/23/21 105 lb 3.2 oz (47.7 kg)  ?  ?Physical Exam ?Vitals and nursing note reviewed.  ?Constitutional:   ?   General: She is not in acute distress. ?   Appearance: She is well-developed. She is not diaphoretic.  ?   Comments: Well-appearing, comfortable, cooperative  ?HENT:  ?   Head: Normocephalic and atraumatic.  ?Eyes:  ?   General:     ?   Right eye: No discharge.     ?   Left eye: No discharge.  ?   Conjunctiva/sclera: Conjunctivae normal.  ?Neck:  ?   Thyroid: No thyromegaly.  ?Cardiovascular:  ?   Rate and Rhythm: Normal rate and regular rhythm.  ?   Heart sounds: Normal heart sounds. No murmur heard. ?Pulmonary:  ?   Effort: Pulmonary effort is normal. No respiratory distress.  ?   Breath sounds: Normal breath sounds. No wheezing or rales.  ?Musculoskeletal:     ?   General: Normal range of motion.  ?   Cervical back: Normal range of motion and neck supple.  ?  Lymphadenopathy:  ?   Cervical: No cervical adenopathy.  ?Skin: ?   General: Skin is warm and dry.  ?   Findings: No erythema or rash.  ?Neurological:  ?   Mental Status: She is alert and oriented to person, place, and time.  ?Psychiatric:     ?   Behavior: Behavior normal.  ?   Comments: Well groomed, good eye contact, normal speech and thoughts  ? ? ? ? ?Results for orders placed or performed in visit on 09/20/21  ?CULTURE, URINE COMPREHENSIVE  ? Specimen: Urine  ? UR  ?Result Value Ref Range  ? Urine Culture, Comprehensive Final report   ? Organism ID, Bacteria Comment   ?Microscopic Examination  ? Urine  ?Result Value Ref Range  ? WBC, UA >30 (A) 0 - 5 /hpf  ? RBC 3-10 (A) 0 - 2 /hpf  ? Epithelial Cells (non renal) 0-10 0 - 10 /hpf  ? Bacteria, UA Many (A) None seen/Few  ?Urinalysis, Complete  ?Result Value Ref Range  ? Specific Gravity, UA 1.010 1.005 - 1.030  ? pH, UA 6.0 5.0 - 7.5  ? Color, UA Straw Yellow  ? Appearance Ur Cloudy (A) Clear  ? Leukocytes,UA 3+ (A) Negative  ? Protein,UA Trace (A) Negative/Trace  ? Glucose, UA Negative Negative   ? Ketones, UA Negative Negative  ? RBC, UA 2+ (A) Negative  ? Bilirubin, UA Negative Negative  ? Urobilinogen, Ur 0.2 0.2 - 1.0 mg/dL  ? Nitrite, UA Positive (A) Negative  ? Microscopic Examination See below:   ?Bladder Scan (Post Void Residual) in office  ?Result Value Ref Range  ? Scan Result 204   ? ?   ?Assessment & Plan:  ? ?Problem List Items Addressed This Visit   ? ? Seropositive rheumatoid arthritis ([place])  ? Prediabetes  ? Paroxysmal A-fib ([place])  ? Relevant Medications  ? diltiazem (CARDIZEM CD) 120 MG 24 hr capsule  ? Insomnia  ? Hypothyroidism - Primary  ? Relevant Orders  ? TSH  ? T4, free  ? ?Other Visit Diagnoses   ? ? Essential hypertension      ? Relevant Medications  ? diltiazem (CARDIZEM CD) 120 MG 24 hr capsule  ? Need for shingles vaccine      ? Relevant Orders  ? Varicella-zoster vaccine IM (Completed)  ? ?  ?  ?HTN ?AFib Paroxysmal ?Controlled currently, will lower Diltiazem dose from 180 to '120mg'$  daily given lower BP ?Continue for rate control ? ?Hypothyroidism ?Due for labs ?Continue Levothyroxine 39mg daily ? ?PreDM ?Recent labs reviewed, in range of PreDM ?Remain off therapy ? ?RA ?Followed by Rheumatology on medication management currently ?On Tramadol PRN for pain per Rheum ? ?Shingrix vaccine ? ?Meds ordered this encounter  ?Medications  ? diltiazem (CARDIZEM CD) 120 MG 24 hr capsule  ?  Sig: Take 1 capsule (120 mg total) by mouth daily.  ?  Dispense:  90 capsule  ?  Refill:  3  ? ? ? ? ?Follow up plan: ?Return in about 6 months (around 08/11/2022) for 6 month follow-up HTN CKD Thyroid #2 Shingrix. ? ? ?ANobie Putnam DO ?[facility] ?02/08/2022, 8:45 AM ?

## 2022-02-08 NOTE — Patient Instructions (Addendum)
Thank you for coming to the office today. ? ?Shingles vaccin dose 1, can repeat dose in 2-6 months to complete it. ? ?Dose reduction of Diltiazem 180 to '120mg'$  daily ? ?Check thyroid labs. ? ?Please schedule a Follow-up Appointment to: Return in about 6 months (around 08/11/2022) for 6 month follow-up HTN CKD Thyroid #2 Shingrix. ? ?If you have any other questions or concerns, please feel free to call the office or send a message through [place]. You may also schedule an earlier appointment if necessary. ? ?Additionally, you may be receiving a survey about your experience at our office within a few days to 1 week by e-mail or mail. We value your feedback. ? ?Nobie Putnam, DO ?[place] ?

## 2022-02-09 LAB — TSH: TSH: 1.54 mIU/L (ref 0.40–4.50)

## 2022-02-09 LAB — T4, FREE: Free T4: 0.8 ng/dL (ref 0.8–1.8)

## 2022-02-27 DIAGNOSIS — M159 Polyosteoarthritis, unspecified: Secondary | ICD-10-CM | POA: Diagnosis not present

## 2022-02-27 DIAGNOSIS — Z79899 Other long term (current) drug therapy: Secondary | ICD-10-CM | POA: Diagnosis not present

## 2022-02-27 DIAGNOSIS — M059 Rheumatoid arthritis with rheumatoid factor, unspecified: Secondary | ICD-10-CM | POA: Diagnosis not present

## 2022-02-27 DIAGNOSIS — M0579 Rheumatoid arthritis with rheumatoid factor of multiple sites without organ or systems involvement: Secondary | ICD-10-CM | POA: Diagnosis not present

## 2022-03-28 DIAGNOSIS — I480 Paroxysmal atrial fibrillation: Secondary | ICD-10-CM | POA: Diagnosis not present

## 2022-03-28 DIAGNOSIS — I10 Essential (primary) hypertension: Secondary | ICD-10-CM | POA: Diagnosis not present

## 2022-03-28 DIAGNOSIS — N184 Chronic kidney disease, stage 4 (severe): Secondary | ICD-10-CM | POA: Diagnosis not present

## 2022-03-28 DIAGNOSIS — D472 Monoclonal gammopathy: Secondary | ICD-10-CM | POA: Diagnosis not present

## 2022-03-28 DIAGNOSIS — R809 Proteinuria, unspecified: Secondary | ICD-10-CM | POA: Diagnosis not present

## 2022-04-04 DIAGNOSIS — J449 Chronic obstructive pulmonary disease, unspecified: Secondary | ICD-10-CM | POA: Diagnosis not present

## 2022-04-04 DIAGNOSIS — D472 Monoclonal gammopathy: Secondary | ICD-10-CM | POA: Diagnosis not present

## 2022-04-04 DIAGNOSIS — E785 Hyperlipidemia, unspecified: Secondary | ICD-10-CM | POA: Diagnosis not present

## 2022-04-04 DIAGNOSIS — R809 Proteinuria, unspecified: Secondary | ICD-10-CM | POA: Diagnosis not present

## 2022-04-04 DIAGNOSIS — I10 Essential (primary) hypertension: Secondary | ICD-10-CM | POA: Diagnosis not present

## 2022-04-04 DIAGNOSIS — N1330 Unspecified hydronephrosis: Secondary | ICD-10-CM | POA: Diagnosis not present

## 2022-04-04 DIAGNOSIS — R7303 Prediabetes: Secondary | ICD-10-CM | POA: Diagnosis not present

## 2022-04-04 DIAGNOSIS — I480 Paroxysmal atrial fibrillation: Secondary | ICD-10-CM | POA: Diagnosis not present

## 2022-04-04 DIAGNOSIS — D539 Nutritional anemia, unspecified: Secondary | ICD-10-CM | POA: Diagnosis not present

## 2022-04-04 DIAGNOSIS — M059 Rheumatoid arthritis with rheumatoid factor, unspecified: Secondary | ICD-10-CM | POA: Diagnosis not present

## 2022-04-04 DIAGNOSIS — M150 Primary generalized (osteo)arthritis: Secondary | ICD-10-CM | POA: Diagnosis not present

## 2022-04-04 DIAGNOSIS — N184 Chronic kidney disease, stage 4 (severe): Secondary | ICD-10-CM | POA: Diagnosis not present

## 2022-05-30 DIAGNOSIS — M159 Polyosteoarthritis, unspecified: Secondary | ICD-10-CM | POA: Diagnosis not present

## 2022-05-30 DIAGNOSIS — M0579 Rheumatoid arthritis with rheumatoid factor of multiple sites without organ or systems involvement: Secondary | ICD-10-CM | POA: Diagnosis not present

## 2022-05-30 DIAGNOSIS — M792 Neuralgia and neuritis, unspecified: Secondary | ICD-10-CM | POA: Diagnosis not present

## 2022-05-30 DIAGNOSIS — Z79899 Other long term (current) drug therapy: Secondary | ICD-10-CM | POA: Diagnosis not present

## 2022-07-04 ENCOUNTER — Other Ambulatory Visit: Payer: Self-pay | Admitting: Family Medicine

## 2022-07-04 DIAGNOSIS — E039 Hypothyroidism, unspecified: Secondary | ICD-10-CM

## 2022-07-05 DIAGNOSIS — M059 Rheumatoid arthritis with rheumatoid factor, unspecified: Secondary | ICD-10-CM | POA: Diagnosis not present

## 2022-07-05 DIAGNOSIS — N1330 Unspecified hydronephrosis: Secondary | ICD-10-CM | POA: Diagnosis not present

## 2022-07-05 DIAGNOSIS — D539 Nutritional anemia, unspecified: Secondary | ICD-10-CM | POA: Diagnosis not present

## 2022-07-05 DIAGNOSIS — E785 Hyperlipidemia, unspecified: Secondary | ICD-10-CM | POA: Diagnosis not present

## 2022-07-05 DIAGNOSIS — R7303 Prediabetes: Secondary | ICD-10-CM | POA: Diagnosis not present

## 2022-07-05 NOTE — Telephone Encounter (Signed)
Requested Prescriptions  Pending Prescriptions Disp Refills  . levothyroxine (SYNTHROID) 25 MCG tablet [Pharmacy Med Name: LEVOTHYROXINE 0.'025MG'$  (25MCG) TAB] 90 tablet 1    Sig: TAKE 1 TABLET(25 MCG) BY MOUTH DAILY BEFORE BREAKFAST     Endocrinology:  Hypothyroid Agents Passed - 07/04/2022  4:00 PM      Passed - TSH in normal range and within 360 days    TSH  Date Value Ref Range Status  02/08/2022 1.54 0.40 - 4.50 mIU/L Final         Passed - Valid encounter within last 12 months    Recent Outpatient Visits          4 months ago Acquired hypothyroidism   [place], DO   8 months ago Centrilobular emphysema [facility])   [facility] Olin Hauser, DO   1 year ago Acute cystitis with hematuria   [place], DO   1 year ago [place], FNP   2 years ago Non-healing ulcer of lower leg, right, limited to breakdown of skin [facility])   [facility] Parks Ranger, Devonne Doughty, DO      Future Appointments            In 1 month Parks Ranger, Devonne Doughty, [facility], [facility]

## 2022-07-12 DIAGNOSIS — R7303 Prediabetes: Secondary | ICD-10-CM | POA: Diagnosis not present

## 2022-07-12 DIAGNOSIS — R809 Proteinuria, unspecified: Secondary | ICD-10-CM | POA: Diagnosis not present

## 2022-07-12 DIAGNOSIS — I10 Essential (primary) hypertension: Secondary | ICD-10-CM | POA: Diagnosis not present

## 2022-07-12 DIAGNOSIS — J449 Chronic obstructive pulmonary disease, unspecified: Secondary | ICD-10-CM | POA: Diagnosis not present

## 2022-07-12 DIAGNOSIS — E039 Hypothyroidism, unspecified: Secondary | ICD-10-CM | POA: Diagnosis not present

## 2022-07-12 DIAGNOSIS — D472 Monoclonal gammopathy: Secondary | ICD-10-CM | POA: Diagnosis not present

## 2022-07-12 DIAGNOSIS — G4709 Other insomnia: Secondary | ICD-10-CM | POA: Diagnosis not present

## 2022-07-12 DIAGNOSIS — I480 Paroxysmal atrial fibrillation: Secondary | ICD-10-CM | POA: Diagnosis not present

## 2022-07-12 DIAGNOSIS — E785 Hyperlipidemia, unspecified: Secondary | ICD-10-CM | POA: Diagnosis not present

## 2022-07-12 DIAGNOSIS — M150 Primary generalized (osteo)arthritis: Secondary | ICD-10-CM | POA: Diagnosis not present

## 2022-07-12 DIAGNOSIS — D539 Nutritional anemia, unspecified: Secondary | ICD-10-CM | POA: Diagnosis not present

## 2022-07-12 DIAGNOSIS — I872 Venous insufficiency (chronic) (peripheral): Secondary | ICD-10-CM | POA: Diagnosis not present

## 2022-07-17 ENCOUNTER — Other Ambulatory Visit: Payer: Self-pay | Admitting: Family Medicine

## 2022-07-17 DIAGNOSIS — R110 Nausea: Secondary | ICD-10-CM

## 2022-07-18 NOTE — Telephone Encounter (Signed)
Requested medications are due for refill today.  yes  Requested medications are on the active medications list.  yes  Last refill. 10/23/2021 #30 3 refills  Future visit scheduled.   yes  Notes to clinic.  Medication refill not delegated.    Requested Prescriptions  Pending Prescriptions Disp Refills   promethazine (PHENERGAN) 12.5 MG tablet [Pharmacy Med Name: PROMETHAZINE 12.'5MG'$  TABLETS] 30 tablet 3    Sig: TAKE 1 TO 2 TABLETS(12.5 TO 25 MG) BY MOUTH EVERY 8 HOURS AS NEEDED FOR NAUSEA OR VOMITING     Not Delegated - Gastroenterology: Antiemetics Failed - 07/17/2022  9:28 AM      Failed - This refill cannot be delegated      Passed - Valid encounter within last 6 months    Recent Outpatient Visits           5 months ago Acquired hypothyroidism   [place], DO   8 months ago Centrilobular emphysema [facility])   [facility] Olin Hauser, DO   1 year ago Acute cystitis with hematuria   [place], DO   1 year ago [place], FNP   2 years ago Non-healing ulcer of lower leg, right, limited to breakdown of skin [facility])   [facility] Parks Ranger, Devonne Doughty, DO       Future Appointments             In 3 weeks Parks Ranger, Devonne Doughty, [facility], [facility]

## 2022-08-13 ENCOUNTER — Encounter: Payer: Self-pay | Admitting: Family Medicine

## 2022-08-13 ENCOUNTER — Ambulatory Visit (INDEPENDENT_AMBULATORY_CARE_PROVIDER_SITE_OTHER): Payer: Medicare Other | Admitting: Family Medicine

## 2022-08-13 VITALS — BP 138/70 | HR 76 | Ht 61.0 in | Wt 118.8 lb

## 2022-08-13 DIAGNOSIS — M059 Rheumatoid arthritis with rheumatoid factor, unspecified: Secondary | ICD-10-CM | POA: Diagnosis not present

## 2022-08-13 DIAGNOSIS — E039 Hypothyroidism, unspecified: Secondary | ICD-10-CM

## 2022-08-13 DIAGNOSIS — Z23 Encounter for immunization: Secondary | ICD-10-CM

## 2022-08-13 DIAGNOSIS — I10 Essential (primary) hypertension: Secondary | ICD-10-CM | POA: Diagnosis not present

## 2022-08-13 MED ORDER — GABAPENTIN 300 MG PO CAPS: 300.0000 mg | ORAL_CAPSULE | Freq: Every day | ORAL | 3 refills | Status: DC

## 2022-08-13 NOTE — Patient Instructions (Addendum)
Thank you for coming to the office today.  Dose change Gabapentin to '300mg'$  QHS only, discontinue '100mg'$  in AM and Afternoon.  Completed Flu Shot and Shingles Booster   Please schedule a Follow-up Appointment to: Return in about 6 months (around 02/12/2023) for 6 month follow-up.  If you have any other questions or concerns, please feel free to call the office or send a message through [place]. You may also schedule an earlier appointment if necessary.  Additionally, you may be receiving a survey about your experience at our office within a few days to 1 week by e-mail or mail. We value your feedback.  Nobie Putnam, DO [place]

## 2022-08-13 NOTE — Progress Notes (Signed)
Subjective:    Patient ID: Alexis Owens, female    DOB: November 18, 1940, 81 y.o.   MRN: [id number]  Alexis Owens is a 81 y.o. female presenting on 08/13/2022 for Hypertension   HPI  Urinary Incontinence, mixed Not want to go back to Urology Ins not covering Myrbetriq She is asking about restarting Oxybutynin   Rheumatoid Arthritis OA Followed by [facility] Rheumatology Dr Posey Pronto - last seen 05/2022 Managing her Tramadol PRN pain. On Plaquenil   HTN CKD IIIb Nephrology Dr Joylene John Last seen 06/2022, with labs, GFR 36 Continues on Diltiazem '120mg'$  daily   Hypothyroidism Due for lab, last was 1 year ago 2022. Continues on Levothyroxine   Atrial Fibrillation S/p Ablation success for 10 yr w/o medication Has had relapse  Prefers to avoid some medication over time Taking Gabapentin '300mg'$  QHS only now, she has discontinued the Gabapentin '100mg'$  twice a day during morning, and afternoon.  Health Maintenance: Flu Shot and Shingles vaccine today     02/08/2022    8:27 AM 01/05/2022    9:02 AM 12/20/2020    9:54 AM  Depression screen PHQ 2/9  Decreased Interest 0 0 0  Down, Depressed, Hopeless 0 0 0  PHQ - 2 Score 0 0 0  Altered sleeping 1    Tired, decreased energy 1    Change in appetite 0    Feeling bad or failure about yourself  0    Trouble concentrating 0    Moving slowly or fidgety/restless 0    Suicidal thoughts 0    PHQ-9 Score 2    Difficult doing work/chores Not difficult at all      Social History   Tobacco Use   Smoking status: Former    Types: Cigarettes    Quit date: 07/19/2014    Years since quitting: 8.0   Smokeless tobacco: Never  Vaping Use   Vaping Use: Never used  Substance Use Topics   Alcohol use: Not Currently    Alcohol/week: 14.0 standard drinks of alcohol    Types: 14 Glasses of wine per week   Drug use: No    Review of Systems Per HPI unless specifically indicated above     Objective:    BP (!) 142/70 (BP Location: Left Arm, Cuff Size:  Normal)   Pulse 76   Ht '5\' 1"'$  (1.549 m)   Wt 118 lb 12.8 oz (53.9 kg)   SpO2 100%   BMI 22.45 kg/m   Wt Readings from Last 3 Encounters:  08/13/22 118 lb 12.8 oz (53.9 kg)  02/08/22 108 lb 6.4 oz (49.2 kg)  01/05/22 109 lb 12.8 oz (49.8 kg)    Physical Exam Results for orders placed or performed in visit on 02/08/22  TSH  Result Value Ref Range   TSH 1.54 0.40 - 4.50 mIU/L  T4, free  Result Value Ref Range   Free T4 0.8 0.8 - 1.8 ng/dL      Assessment & Plan:   Problem List Items Addressed This Visit     Hypothyroidism   Seropositive rheumatoid arthritis ([place])   Relevant Medications   gabapentin (NEURONTIN) 300 MG capsule   Other Visit Diagnoses     Essential hypertension    -  Primary   Need for shingles vaccine       Relevant Orders   Zoster Recombinant (Shingrix ) (Completed)   Needs flu shot       Relevant Orders   Flu Vaccine QUAD High Dose(Fluad) (Completed)  Hypothyroidism Last labs thyroid controlled Continue current Levothyroxine 22mg  HTN Controlled Continue current therapy  Rheumatoid Arthritis Followed by Rheumatology Adjust Gabapentin to '300mg'$  QHS, instead of '100mg'$  in AM / Afternoon as well., new rx sent.  Vaccines today Shingles #1 and Flu Shot  Orders Placed This Encounter  Procedures   Zoster Recombinant (Shingrix )   Flu Vaccine QUAD High Dose(Fluad)     Meds ordered this encounter  Medications   gabapentin (NEURONTIN) 300 MG capsule    Sig: Take 1 capsule (300 mg total) by mouth at bedtime.    Dispense:  90 capsule    Refill:  3    Dose reduction      Follow up plan: Return in about 6 months (around 02/12/2023) for 6 month follow-up.   [facility]obie Putnam DO[facility] 08/13/2022, 8:34 AM

## 2022-09-03 DIAGNOSIS — M159 Polyosteoarthritis, unspecified: Secondary | ICD-10-CM | POA: Diagnosis not present

## 2022-09-03 DIAGNOSIS — Z79899 Other long term (current) drug therapy: Secondary | ICD-10-CM | POA: Diagnosis not present

## 2022-09-03 DIAGNOSIS — M0579 Rheumatoid arthritis with rheumatoid factor of multiple sites without organ or systems involvement: Secondary | ICD-10-CM | POA: Diagnosis not present

## 2022-09-04 DIAGNOSIS — Z79899 Other long term (current) drug therapy: Secondary | ICD-10-CM | POA: Diagnosis not present

## 2022-09-04 DIAGNOSIS — M0579 Rheumatoid arthritis with rheumatoid factor of multiple sites without organ or systems involvement: Secondary | ICD-10-CM | POA: Diagnosis not present

## 2022-10-03 ENCOUNTER — Other Ambulatory Visit: Payer: Self-pay | Admitting: Family Medicine

## 2022-10-03 DIAGNOSIS — Z79899 Other long term (current) drug therapy: Secondary | ICD-10-CM | POA: Diagnosis not present

## 2022-10-03 DIAGNOSIS — G4709 Other insomnia: Secondary | ICD-10-CM

## 2022-10-03 NOTE — Telephone Encounter (Signed)
Requested Prescriptions  Pending Prescriptions Disp Refills   traZODone (DESYREL) 50 MG tablet [Pharmacy Med Name: TRAZODONE '50MG'$  TABLETS] 90 tablet 0    Sig: TAKE 1 TABLET(50 MG) BY MOUTH AT BEDTIME     Psychiatry: Antidepressants - Serotonin Modulator Passed - 10/03/2022  8:39 AM      Passed - Valid encounter within last 6 months    Recent Outpatient Visits           1 month ago Essential hypertension   [place], DO   7 months ago Acquired hypothyroidism   [place], DO   11 months ago Centrilobular emphysema [facility])   [facility] Olin Hauser, DO   1 year ago Acute cystitis with hematuria   [place], DO   1 year ago [facility] [place], Lupita Raider, FNP       Future Appointments             In 3 months Parks Ranger, Devonne Doughty, [facility], [facility]

## 2022-10-22 ENCOUNTER — Other Ambulatory Visit: Payer: Self-pay | Admitting: Family Medicine

## 2022-10-22 DIAGNOSIS — I10 Essential (primary) hypertension: Secondary | ICD-10-CM

## 2022-10-23 NOTE — Telephone Encounter (Signed)
Requested medication (s) are due for refill today: yes  Requested medication (s) are on the active medication list: yes    Last refill: 10/16/21  #90  3 refills  Future visit scheduled Yes 01/14/23  Notes to clinic:Failed due to labs, please review. Thank you.   Requested Prescriptions  Pending Prescriptions Disp Refills   lisinopril (ZESTRIL) 20 MG tablet [Pharmacy Med Name: LISINOPRIL '20MG'$  TABLETS] 90 tablet 3    Sig: TAKE 1 TABLET BY MOUTH DAILY     Cardiovascular:  ACE Inhibitors Failed - 10/22/2022  2:37 PM      Failed - Cr in normal range and within 180 days    Creat  Date Value Ref Range Status  12/21/2020 0.99 (H) 0.60 - 0.93 mg/dL Final    Comment:    For patients >41 years of age, the reference limit for Creatinine is approximately 13% higher for people identified as African-American. .          Failed - K in normal range and within 180 days    Potassium  Date Value Ref Range Status  12/21/2020 4.6 3.5 - 5.3 mmol/L Final         Passed - Patient is not pregnant      Passed - Last BP in normal range    BP Readings from Last 1 Encounters:  08/13/22 138/70         Passed - Valid encounter within last 6 months    Recent Outpatient Visits           2 months ago Essential hypertension   [place], DO   8 months ago Acquired hypothyroidism   [place], DO   1 year ago Centrilobular emphysema [facility])   [facility] Olin Hauser, DO   1 year ago Acute cystitis with hematuria   [place], DO   1 year ago [facility] [place], Lupita Raider, FNP       Future Appointments             In 2 months Parks Ranger, Devonne Doughty, [facility], [facility]

## 2022-10-24 DIAGNOSIS — D472 Monoclonal gammopathy: Secondary | ICD-10-CM | POA: Diagnosis not present

## 2022-10-24 DIAGNOSIS — I480 Paroxysmal atrial fibrillation: Secondary | ICD-10-CM | POA: Diagnosis not present

## 2022-10-24 DIAGNOSIS — I10 Essential (primary) hypertension: Secondary | ICD-10-CM | POA: Diagnosis not present

## 2022-10-24 DIAGNOSIS — R809 Proteinuria, unspecified: Secondary | ICD-10-CM | POA: Diagnosis not present

## 2022-10-24 DIAGNOSIS — J449 Chronic obstructive pulmonary disease, unspecified: Secondary | ICD-10-CM | POA: Diagnosis not present

## 2022-10-31 DIAGNOSIS — D539 Nutritional anemia, unspecified: Secondary | ICD-10-CM | POA: Diagnosis not present

## 2022-10-31 DIAGNOSIS — G4700 Insomnia, unspecified: Secondary | ICD-10-CM | POA: Diagnosis not present

## 2022-10-31 DIAGNOSIS — E7800 Pure hypercholesterolemia, unspecified: Secondary | ICD-10-CM | POA: Diagnosis not present

## 2022-10-31 DIAGNOSIS — E039 Hypothyroidism, unspecified: Secondary | ICD-10-CM | POA: Diagnosis not present

## 2022-10-31 DIAGNOSIS — J449 Chronic obstructive pulmonary disease, unspecified: Secondary | ICD-10-CM | POA: Diagnosis not present

## 2022-10-31 DIAGNOSIS — R809 Proteinuria, unspecified: Secondary | ICD-10-CM | POA: Diagnosis not present

## 2022-10-31 DIAGNOSIS — N1330 Unspecified hydronephrosis: Secondary | ICD-10-CM | POA: Diagnosis not present

## 2022-10-31 DIAGNOSIS — I10 Essential (primary) hypertension: Secondary | ICD-10-CM | POA: Diagnosis not present

## 2022-10-31 DIAGNOSIS — M150 Primary generalized (osteo)arthritis: Secondary | ICD-10-CM | POA: Diagnosis not present

## 2022-10-31 DIAGNOSIS — I480 Paroxysmal atrial fibrillation: Secondary | ICD-10-CM | POA: Diagnosis not present

## 2022-10-31 DIAGNOSIS — D472 Monoclonal gammopathy: Secondary | ICD-10-CM | POA: Diagnosis not present

## 2022-10-31 DIAGNOSIS — I872 Venous insufficiency (chronic) (peripheral): Secondary | ICD-10-CM | POA: Diagnosis not present

## 2022-12-06 DIAGNOSIS — M159 Polyosteoarthritis, unspecified: Secondary | ICD-10-CM | POA: Diagnosis not present

## 2022-12-06 DIAGNOSIS — Z79899 Other long term (current) drug therapy: Secondary | ICD-10-CM | POA: Diagnosis not present

## 2022-12-06 DIAGNOSIS — M792 Neuralgia and neuritis, unspecified: Secondary | ICD-10-CM | POA: Diagnosis not present

## 2022-12-06 DIAGNOSIS — M80052S Age-related osteoporosis with current pathological fracture, left femur, sequela: Secondary | ICD-10-CM | POA: Diagnosis not present

## 2022-12-06 DIAGNOSIS — M0579 Rheumatoid arthritis with rheumatoid factor of multiple sites without organ or systems involvement: Secondary | ICD-10-CM | POA: Diagnosis not present

## 2022-12-23 ENCOUNTER — Other Ambulatory Visit: Payer: Self-pay | Admitting: Family Medicine

## 2022-12-23 DIAGNOSIS — R110 Nausea: Secondary | ICD-10-CM

## 2022-12-23 DIAGNOSIS — R059 Cough, unspecified: Secondary | ICD-10-CM

## 2022-12-23 DIAGNOSIS — K219 Gastro-esophageal reflux disease without esophagitis: Secondary | ICD-10-CM

## 2022-12-24 NOTE — Telephone Encounter (Signed)
Requested medication (s) are due for refill today: Yes  Requested medication (s) are on the active medication list: Yes  Last refill:  07/18/22  Future visit scheduled: Yes  Notes to clinic:  See request.    Requested Prescriptions  Pending Prescriptions Disp Refills   promethazine (PHENERGAN) 12.5 MG tablet [Pharmacy Med Name: PROMETHAZINE 12.5MG TABLETS] 30 tablet 3    Sig: TAKE 1 TO 2 TABLETS(12.5 TO 25 MG) BY MOUTH EVERY 8 HOURS AS NEEDED FOR NAUSEA OR VOMITING     Not Delegated - Gastroenterology: Antiemetics Failed - 12/23/2022 10:27 AM      Failed - This refill cannot be delegated      Passed - Valid encounter within last 6 months    Recent Outpatient Visits           4 months ago Essential hypertension   [place], DO   10 months ago Acquired hypothyroidism   [place], DO   1 year ago Centrilobular emphysema [facility])   [facility] Olin Hauser, DO   1 year ago Acute cystitis with hematuria   [place], [place], DO   2 years ago [facility] [place], Lupita Raider, FNP       Future Appointments             In 3 weeks Parks Ranger, Devonne Doughty, DO [facility], [facility]            Signed Prescriptions Disp Refills   albuterol (VENTOLIN HFA) 108 (90 Base) MCG/ACT inhaler 8.5 g 2    Sig: INHALE 1 TO 2 PUFFS INTO THE LUNGS EVERY 6 HOURS AS NEEDED FOR WHEEZING OR SHORTNESS OF BREATH     Pulmonology:  Beta Agonists 2 Passed - 12/23/2022 10:27 AM      Passed - Last BP in normal range    BP Readings from Last 1 Encounters:  08/13/22 138/70         Passed - Last Heart Rate in normal range    Pulse Readings from Last 1 Encounters:  08/13/22 76         Passed - Valid encounter within last 12 months    Recent Outpatient  Visits           4 months ago Essential hypertension   [place], Alexander J, DO   10 months ago Acquired hypothyroidism   [place], DO   1 year ago Centrilobular emphysema [facility])   [facility] Olin Hauser, DO   1 year ago Acute cystitis with hematuria   [place], Alexander J, DO   2 years ago [place], FNP       Future Appointments             In 3 weeks Parks Ranger, Devonne Doughty, DO [facility], [facility]             omeprazole (PRILOSEC) 20 MG capsule 90 capsule 1    Sig: TAKE 1 CAPSULE(20 MG) BY MOUTH DAILY     Gastroenterology: Proton Pump Inhibitors Passed - 12/23/2022 10:27 AM      Passed -  Valid encounter within last 12 months    Recent Outpatient Visits           4 months ago Essential hypertension   [place], DO   10 months ago Acquired hypothyroidism   [facility] Olin Hauser, DO   1 year ago Centrilobular emphysema [facility])   [facility] Olin Hauser, DO   1 year ago Acute cystitis with hematuria   [facility] Olin Hauser, DO   2 years ago [facility] [place], Lupita Raider, FNP       Future Appointments             In 3 weeks Parks Ranger, Devonne Doughty, [facility], [facility]

## 2023-01-14 ENCOUNTER — Ambulatory Visit (INDEPENDENT_AMBULATORY_CARE_PROVIDER_SITE_OTHER): Payer: Medicare Other | Admitting: Family Medicine

## 2023-01-14 ENCOUNTER — Other Ambulatory Visit: Payer: Self-pay | Admitting: Family Medicine

## 2023-01-14 VITALS — BP 162/76 | HR 74 | Temp 98.1°F | Ht 61.0 in | Wt 125.2 lb

## 2023-01-14 DIAGNOSIS — R110 Nausea: Secondary | ICD-10-CM | POA: Diagnosis not present

## 2023-01-14 DIAGNOSIS — R059 Cough, unspecified: Secondary | ICD-10-CM | POA: Diagnosis not present

## 2023-01-14 DIAGNOSIS — M059 Rheumatoid arthritis with rheumatoid factor, unspecified: Secondary | ICD-10-CM

## 2023-01-14 DIAGNOSIS — I10 Essential (primary) hypertension: Secondary | ICD-10-CM

## 2023-01-14 DIAGNOSIS — E039 Hypothyroidism, unspecified: Secondary | ICD-10-CM

## 2023-01-14 DIAGNOSIS — J0110 Acute frontal sinusitis, unspecified: Secondary | ICD-10-CM | POA: Diagnosis not present

## 2023-01-14 DIAGNOSIS — J432 Centrilobular emphysema: Secondary | ICD-10-CM | POA: Diagnosis not present

## 2023-01-14 DIAGNOSIS — G4709 Other insomnia: Secondary | ICD-10-CM

## 2023-01-14 DIAGNOSIS — N3946 Mixed incontinence: Secondary | ICD-10-CM

## 2023-01-14 DIAGNOSIS — I480 Paroxysmal atrial fibrillation: Secondary | ICD-10-CM

## 2023-01-14 DIAGNOSIS — Z0000 Encounter for general adult medical examination without abnormal findings: Secondary | ICD-10-CM

## 2023-01-14 DIAGNOSIS — R7303 Prediabetes: Secondary | ICD-10-CM

## 2023-01-14 MED ORDER — BREZTRI AEROSPHERE 160-9-4.8 MCG/ACT IN AERO: 2.0000 | INHALATION_SPRAY | Freq: Two times a day (BID) | RESPIRATORY_TRACT | 11 refills | Status: DC

## 2023-01-14 MED ORDER — TRAZODONE HCL 50 MG PO TABS: 50.0000 mg | ORAL_TABLET | Freq: Every day | ORAL | 3 refills | Status: DC

## 2023-01-14 MED ORDER — DILTIAZEM HCL ER COATED BEADS 120 MG PO CP24: 120.0000 mg | ORAL_CAPSULE | Freq: Every day | ORAL | 3 refills | Status: DC

## 2023-01-14 MED ORDER — OXYBUTYNIN CHLORIDE ER 15 MG PO TB24: 15.0000 mg | ORAL_TABLET | Freq: Every day | ORAL | 3 refills | Status: DC

## 2023-01-14 MED ORDER — ALBUTEROL SULFATE HFA 108 (90 BASE) MCG/ACT IN AERS: INHALATION_SPRAY | RESPIRATORY_TRACT | 2 refills | Status: DC

## 2023-01-14 MED ORDER — DOXYCYCLINE HYCLATE 100 MG PO TABS: 100.0000 mg | ORAL_TABLET | Freq: Two times a day (BID) | ORAL | 0 refills | Status: DC

## 2023-01-14 MED ORDER — PROMETHAZINE HCL 12.5 MG PO TABS: 12.5000 mg | ORAL_TABLET | Freq: Three times a day (TID) | ORAL | 3 refills | Status: DC | PRN

## 2023-01-14 NOTE — Progress Notes (Signed)
Subjective:    Patient ID: Alexis Owens, female    DOB: 02-24-1941, 82 y.o.   MRN: [id number]  Synethia Owens is a 82 y.o. female presenting on 01/14/2023 for Hypertension, Hypothyroidism, Over Active Bladder (The pt requesting a refill on the Oxybutnin for OAB), and Sinusitis (Pt complains of sinus pressure, frontal lobe, maxillary, post nasal drip with intermittent nasal bleeding x 1 mth )   HPI  Urinary Incontinence, mixed Not want to go back to Urology Ins not covering Myrbetriq Improved on Oxybutynin Re order Oxybutynin XL '15mg'$  Admits dry mouth side effect   Rheumatoid Arthritis OA Followed by [facility] Dr Posey Pronto Managing her Tramadol PRN pain. On Plaquenil  Naues[place], re order Phenergan   HTN CKD IIIb Nephrology Dr Joylene John Last seen 06/2022, with labs, GFR 36 Continues on Diltiazem '120mg'$  daily   Hypothyroidism Due for lab, she will return next visit for labs. Continues on Levothyroxine, controlled   Atrial Fibrillation S/p Ablation success for 10 yr w/o medication Has had relapse   Prefers to avoid some medication over time Taking Gabapentin '300mg'$  QHS only now, she has discontinued the Gabapentin '100mg'$  twice a day during morning, and afternoon.  Centrilobular Emphysema Gradual increase frequency of Albuterol use Not on maintenance therapy previously      01/14/2023    8:28 AM 08/13/2022    9:45 AM 02/08/2022    8:27 AM  Depression screen PHQ 2/9  Decreased Interest 0 0 0  Down, Depressed, Hopeless 0 0 0  PHQ - 2 Score 0 0 0  Altered sleeping '1 1 1  '$ Tired, decreased energy 0 1 1  Change in appetite 0 0 0  Feeling bad or failure about yourself  0 0 0  Trouble concentrating 0 0 0  Moving slowly or fidgety/restless 0 0 0  Suicidal thoughts 0 0 0  PHQ-9 Score '1 2 2  '$ Difficult doing work/chores Not difficult at all Not difficult at all Not difficult at all    Social History   Tobacco Use   Smoking status: Former    Types: Cigarettes    Quit date:  07/19/2014    Years since quitting: 8.4   Smokeless tobacco: Never  Vaping Use   Vaping Use: Never used  Substance Use Topics   Alcohol use: Not Currently    Alcohol/week: 14.0 standard drinks of alcohol    Types: 14 Glasses of wine per week   Drug use: No    Review of Systems Per HPI unless specifically indicated above     Objective:    BP (!) 162/76 (BP Location: Left Arm, Patient Position: Sitting, Cuff Size: Normal)   Pulse 74   Temp 98.1 F (36.7 C) (Oral)   Ht '5\' 1"'$  (1.549 m)   Wt 125 lb 3.2 oz (56.8 kg)   SpO2 100%   BMI 23.66 kg/m   Wt Readings from Last 3 Encounters:  01/14/23 125 lb 3.2 oz (56.8 kg)  08/13/22 118 lb 12.8 oz (53.9 kg)  02/08/22 108 lb 6.4 oz (49.2 kg)    Physical Exam Vitals and nursing note reviewed.  Constitutional:      General: She is not in acute distress.    Appearance: She is well-developed. She is not diaphoretic.     Comments: Well-appearing thin appearing 82 yr female comfortable, cooperative  HENT:     Head: Normocephalic and atraumatic.  Eyes:     General:        Right eye: No discharge.  Left eye: No discharge.     Conjunctiva/sclera: Conjunctivae normal.  Neck:     Thyroid: No thyromegaly.  Cardiovascular:     Rate and Rhythm: Normal rate and regular rhythm.     Heart sounds: Normal heart sounds. No murmur heard. Pulmonary:     Effort: Pulmonary effort is normal. No respiratory distress.     Breath sounds: Wheezing present. No rales.  Musculoskeletal:        General: Normal range of motion.     Cervical back: Normal range of motion and neck supple.  Lymphadenopathy:     Cervical: No cervical adenopathy.  Skin:    General: Skin is warm and dry.     Findings: No erythema or rash.  Neurological:     Mental Status: She is alert and oriented to person, place, and time.  Psychiatric:        Behavior: Behavior normal.     Comments: Well groomed, good eye contact, normal speech and thoughts    Results for orders  placed or performed in visit on 02/08/22  TSH  Result Value Ref Range   TSH 1.54 0.40 - 4.50 mIU/L  T4, free  Result Value Ref Range   Free T4 0.8 0.8 - 1.8 ng/dL      Assessment & Plan:   Problem List Items Addressed This Visit     Centrilobular emphysema (HCC)   Relevant Medications   promethazine (PHENERGAN) 12.5 MG tablet   albuterol (VENTOLIN HFA) 108 (90 Base) MCG/ACT inhaler   BREZTRI AEROSPHERE 160-9-4.8 MCG/ACT AERO   Insomnia   Relevant Medications   traZODone (DESYREL) 50 MG tablet   Other Visit Diagnoses     Mixed stress and urge urinary incontinence    -  Primary   Relevant Medications   oxybutynin (DITROPAN XL) 15 MG 24 hr tablet   Nausea       Relevant Medications   promethazine (PHENERGAN) 12.5 MG tablet   Essential hypertension       Relevant Medications   diltiazem (CARDIZEM CD) 120 MG 24 hr capsule   Acute non-recurrent frontal sinusitis       Relevant Medications   promethazine (PHENERGAN) 12.5 MG tablet   doxycycline (VIBRA-TABS) 100 MG tablet   Cough       Relevant Medications   albuterol (VENTOLIN HFA) 108 (90 Base) MCG/ACT inhaler       Re order Oxybutynin XL '15mg'$  daily Difficulty with side effect dry mouth due to anti cholinergic Limited options can do remedy  Emphysema Dyspnea persistent Not on maintenance, seems will benefit from further therapy. Start Breztri daily maintenance inhaler 2 puffs twice a day Refilled Albuterol rescue inhaler only use on occason  Re order Phenergan increased amount pill count  HYPERTENSION / Paroxysmal AFib Refilled Diltiazem  For Sinus infection Start taking Doxycycline antibiotic '100mg'$  twice daily for 10 days. Take with full glass of water and stay upright for at least 30 min after taking, may be seated or standing, but should NOT lay down. This is just a safety precaution, if this medicine does not go all the way down throat well it could cause some burning discomfort to throat and  esophagus.  Meds ordered this encounter  Medications   oxybutynin (DITROPAN XL) 15 MG 24 hr tablet    Sig: Take 1 tablet (15 mg total) by mouth at bedtime.    Dispense:  90 tablet    Refill:  3   promethazine (PHENERGAN) 12.5 MG tablet  Sig: Take 1-2 tablets (12.5-25 mg total) by mouth every 8 (eight) hours as needed for nausea or vomiting.    Dispense:  90 tablet    Refill:  3   traZODone (DESYREL) 50 MG tablet    Sig: Take 1 tablet (50 mg total) by mouth at bedtime.    Dispense:  90 tablet    Refill:  3   diltiazem (CARDIZEM CD) 120 MG 24 hr capsule    Sig: Take 1 capsule (120 mg total) by mouth daily.    Dispense:  90 capsule    Refill:  3   doxycycline (VIBRA-TABS) 100 MG tablet    Sig: Take 1 tablet (100 mg total) by mouth 2 (two) times daily. For 10 days. Take with full glass of water, stay upright 30 min after taking.    Dispense:  20 tablet    Refill:  0   albuterol (VENTOLIN HFA) 108 (90 Base) MCG/ACT inhaler    Sig: INHALE 1 TO 2 PUFFS INTO THE LUNGS EVERY 6 HOURS AS NEEDED FOR WHEEZING OR SHORTNESS OF BREATH    Dispense:  8.5 g    Refill:  2   BREZTRI AEROSPHERE 160-9-4.8 MCG/ACT AERO    Sig: Inhale 2 puffs into the lungs 2 (two) times daily.    Dispense:  10.7 g    Refill:  11     Follow up plan: Return in about 6 months (around 07/17/2023) for 6 month fasting lab then 1 week later Annual Physical.  Future labs ordered for 06/2023   Nobie Putnam, [facility] 01/14/2023, 8:42 AM

## 2023-01-14 NOTE — Patient Instructions (Addendum)
Thank you for coming to the office today.  Re order Oxybutynin XL '15mg'$  daily  Start Breztri daily maintenance inhaler 2 puffs twice a day  Refilled Albuterol rescue inhaler only use on occason  Re order Phenergan increased amount  Refilled Diltiazem  For Sinus infection  Start taking Doxycycline antibiotic '100mg'$  twice daily for 10 days. Take with full glass of water and stay upright for at least 30 min after taking, may be seated or standing, but should NOT lay down. This is just a safety precaution, if this medicine does not go all the way down throat well it could cause some burning discomfort to throat and esophagus.  DUE for FASTING BLOOD WORK (no food or drink after midnight before the lab appointment, only water or coffee without cream/sugar on the morning of)  SCHEDULE "Lab Only" visit in the morning at the clinic for lab draw in 6 MONTHS   - Make sure Lab Only appointment is at about 1 week before your next appointment, so that results will be available  For Lab Results, once available within 2-3 days of blood draw, you can can log in to MyChart online to view your results and a brief explanation. Also, we can discuss results at next follow-up visit.    Please schedule a Follow-up Appointment to: Return in about 6 months (around 07/17/2023) for 6 month fasting lab then 1 week later Annual Physical.  If you have any other questions or concerns, please feel free to call the office or send a message through [facility]. You may also schedule an earlier appointment if necessary.  Additionally, you may be receiving a survey about your experience at our office within a few days to 1 week by e-mail or mail. We value your feedback.  Nobie Putnam, DO [place]

## 2023-01-23 ENCOUNTER — Other Ambulatory Visit: Payer: Self-pay | Admitting: Family Medicine

## 2023-01-23 DIAGNOSIS — E039 Hypothyroidism, unspecified: Secondary | ICD-10-CM

## 2023-01-24 NOTE — Telephone Encounter (Signed)
Unable to refill per protocol, Rx expired. Discontinued 01/14/23, patient not taking.  Requested Prescriptions  Pending Prescriptions Disp Refills   levothyroxine (SYNTHROID) 25 MCG tablet [Pharmacy Med Name: LEVOTHYROXINE 0.'025MG'$  (25MCG) TAB] 90 tablet 1    Sig: TAKE 1 TABLET(25 MCG) BY MOUTH DAILY BEFORE BREAKFAST     Endocrinology:  Hypothyroid Agents Passed - 01/23/2023  1:40 PM      Passed - TSH in normal range and within 360 days    TSH  Date Value Ref Range Status  02/08/2022 1.54 0.40 - 4.50 mIU/L Final         Passed - Valid encounter within last 12 months    Recent Outpatient Visits           1 week ago Mixed stress and urge urinary incontinence   [place], [place]   5 months ago Essential hypertension   [facility] Olin Hauser, [place]   11 months ago Acquired hypothyroidism   [facility] Olin Hauser, DO   1 year ago Centrilobular emphysema [facility])   [facility] Olin Hauser, DO   2 years ago Acute cystitis with hematuria   [place], DO       Future Appointments             In 5 months [place] Ranger, Devonne Doughty, DO [facility], [facility]

## 2023-02-20 ENCOUNTER — Encounter: Payer: Self-pay | Admitting: Family Medicine

## 2023-02-20 DIAGNOSIS — J309 Allergic rhinitis, unspecified: Secondary | ICD-10-CM

## 2023-02-20 MED ORDER — FLUTICASONE PROPIONATE 50 MCG/ACT NA SUSP: 2.0000 | Freq: Every day | NASAL | 3 refills | Status: DC

## 2023-03-12 DIAGNOSIS — J449 Chronic obstructive pulmonary disease, unspecified: Secondary | ICD-10-CM | POA: Diagnosis not present

## 2023-03-12 DIAGNOSIS — R809 Proteinuria, unspecified: Secondary | ICD-10-CM | POA: Diagnosis not present

## 2023-03-12 DIAGNOSIS — I872 Venous insufficiency (chronic) (peripheral): Secondary | ICD-10-CM | POA: Diagnosis not present

## 2023-03-12 DIAGNOSIS — D472 Monoclonal gammopathy: Secondary | ICD-10-CM | POA: Diagnosis not present

## 2023-03-12 DIAGNOSIS — I10 Essential (primary) hypertension: Secondary | ICD-10-CM | POA: Diagnosis not present

## 2023-03-19 DIAGNOSIS — D472 Monoclonal gammopathy: Secondary | ICD-10-CM | POA: Diagnosis not present

## 2023-03-19 DIAGNOSIS — E785 Hyperlipidemia, unspecified: Secondary | ICD-10-CM | POA: Diagnosis not present

## 2023-03-19 DIAGNOSIS — I10 Essential (primary) hypertension: Secondary | ICD-10-CM | POA: Diagnosis not present

## 2023-03-19 DIAGNOSIS — R7303 Prediabetes: Secondary | ICD-10-CM | POA: Diagnosis not present

## 2023-03-19 DIAGNOSIS — G4709 Other insomnia: Secondary | ICD-10-CM | POA: Diagnosis not present

## 2023-03-19 DIAGNOSIS — E039 Hypothyroidism, unspecified: Secondary | ICD-10-CM | POA: Diagnosis not present

## 2023-03-19 DIAGNOSIS — I872 Venous insufficiency (chronic) (peripheral): Secondary | ICD-10-CM | POA: Diagnosis not present

## 2023-03-19 DIAGNOSIS — I480 Paroxysmal atrial fibrillation: Secondary | ICD-10-CM | POA: Diagnosis not present

## 2023-03-19 DIAGNOSIS — M150 Primary generalized (osteo)arthritis: Secondary | ICD-10-CM | POA: Diagnosis not present

## 2023-03-19 DIAGNOSIS — J449 Chronic obstructive pulmonary disease, unspecified: Secondary | ICD-10-CM | POA: Diagnosis not present

## 2023-03-19 DIAGNOSIS — D539 Nutritional anemia, unspecified: Secondary | ICD-10-CM | POA: Diagnosis not present

## 2023-03-19 DIAGNOSIS — R809 Proteinuria, unspecified: Secondary | ICD-10-CM | POA: Diagnosis not present

## 2023-04-04 DIAGNOSIS — S93601A Unspecified sprain of right foot, initial encounter: Secondary | ICD-10-CM | POA: Diagnosis not present

## 2023-04-04 DIAGNOSIS — Z4789 Encounter for other orthopedic aftercare: Secondary | ICD-10-CM | POA: Diagnosis not present

## 2023-04-04 DIAGNOSIS — S93401A Sprain of unspecified ligament of right ankle, initial encounter: Secondary | ICD-10-CM | POA: Diagnosis not present

## 2023-04-04 DIAGNOSIS — M7989 Other specified soft tissue disorders: Secondary | ICD-10-CM | POA: Diagnosis not present

## 2023-04-04 DIAGNOSIS — M79671 Pain in right foot: Secondary | ICD-10-CM | POA: Diagnosis not present

## 2023-04-04 DIAGNOSIS — M25571 Pain in right ankle and joints of right foot: Secondary | ICD-10-CM | POA: Diagnosis not present

## 2023-04-04 DIAGNOSIS — S82831A Other fracture of upper and lower end of right fibula, initial encounter for closed fracture: Secondary | ICD-10-CM | POA: Diagnosis not present

## 2023-04-10 DIAGNOSIS — M159 Polyosteoarthritis, unspecified: Secondary | ICD-10-CM | POA: Diagnosis not present

## 2023-04-10 DIAGNOSIS — M0579 Rheumatoid arthritis with rheumatoid factor of multiple sites without organ or systems involvement: Secondary | ICD-10-CM | POA: Diagnosis not present

## 2023-04-10 DIAGNOSIS — S82891A Other fracture of right lower leg, initial encounter for closed fracture: Secondary | ICD-10-CM | POA: Diagnosis not present

## 2023-04-10 DIAGNOSIS — Z79899 Other long term (current) drug therapy: Secondary | ICD-10-CM | POA: Diagnosis not present

## 2023-04-10 DIAGNOSIS — M792 Neuralgia and neuritis, unspecified: Secondary | ICD-10-CM | POA: Diagnosis not present

## 2023-04-24 DIAGNOSIS — M792 Neuralgia and neuritis, unspecified: Secondary | ICD-10-CM | POA: Diagnosis not present

## 2023-04-24 DIAGNOSIS — S93401A Sprain of unspecified ligament of right ankle, initial encounter: Secondary | ICD-10-CM | POA: Diagnosis not present

## 2023-04-24 DIAGNOSIS — L603 Nail dystrophy: Secondary | ICD-10-CM | POA: Diagnosis not present

## 2023-04-24 DIAGNOSIS — S96911A Strain of unspecified muscle and tendon at ankle and foot level, right foot, initial encounter: Secondary | ICD-10-CM | POA: Diagnosis not present

## 2023-04-24 DIAGNOSIS — Z8739 Personal history of other diseases of the musculoskeletal system and connective tissue: Secondary | ICD-10-CM | POA: Diagnosis not present

## 2023-04-24 DIAGNOSIS — I739 Peripheral vascular disease, unspecified: Secondary | ICD-10-CM | POA: Diagnosis not present

## 2023-07-07 ENCOUNTER — Encounter: Payer: Self-pay | Admitting: Family Medicine

## 2023-07-07 DIAGNOSIS — N3946 Mixed incontinence: Secondary | ICD-10-CM

## 2023-07-07 DIAGNOSIS — Z87440 Personal history of urinary (tract) infections: Secondary | ICD-10-CM

## 2023-07-08 NOTE — Telephone Encounter (Signed)
Please review.  KP

## 2023-07-10 ENCOUNTER — Other Ambulatory Visit: Payer: Medicare Other

## 2023-07-10 DIAGNOSIS — Z87440 Personal history of urinary (tract) infections: Secondary | ICD-10-CM

## 2023-07-10 DIAGNOSIS — E039 Hypothyroidism, unspecified: Secondary | ICD-10-CM | POA: Diagnosis not present

## 2023-07-10 DIAGNOSIS — I10 Essential (primary) hypertension: Secondary | ICD-10-CM | POA: Diagnosis not present

## 2023-07-10 DIAGNOSIS — Z0000 Encounter for general adult medical examination without abnormal findings: Secondary | ICD-10-CM

## 2023-07-10 DIAGNOSIS — R7303 Prediabetes: Secondary | ICD-10-CM | POA: Diagnosis not present

## 2023-07-10 DIAGNOSIS — I480 Paroxysmal atrial fibrillation: Secondary | ICD-10-CM

## 2023-07-10 DIAGNOSIS — N3946 Mixed incontinence: Secondary | ICD-10-CM

## 2023-07-10 DIAGNOSIS — M059 Rheumatoid arthritis with rheumatoid factor, unspecified: Secondary | ICD-10-CM

## 2023-07-11 ENCOUNTER — Other Ambulatory Visit: Payer: Self-pay | Admitting: Family Medicine

## 2023-07-11 ENCOUNTER — Other Ambulatory Visit: Payer: Self-pay | Admitting: Internal Medicine

## 2023-07-11 ENCOUNTER — Telehealth: Payer: Self-pay

## 2023-07-11 DIAGNOSIS — K219 Gastro-esophageal reflux disease without esophagitis: Secondary | ICD-10-CM

## 2023-07-11 LAB — COMPLETE METABOLIC PANEL WITH GFR
AG Ratio: 1 (calc) (ref 1.0–2.5)
ALT: 10 U/L (ref 6–29)
AST: 14 U/L (ref 10–35)
Albumin: 3.9 g/dL (ref 3.6–5.1)
Alkaline phosphatase (APISO): 90 U/L (ref 37–153)
BUN/Creatinine Ratio: 13 (calc) (ref 6–22)
BUN: 20 mg/dL (ref 7–25)
CO2: 21 mmol/L (ref 20–32)
Calcium: 9.4 mg/dL (ref 8.6–10.4)
Chloride: 107 mmol/L (ref 98–110)
Creat: 1.52 mg/dL — ABNORMAL HIGH (ref 0.60–0.95)
Globulin: 3.8 g/dL — ABNORMAL HIGH (ref 1.9–3.7)
Glucose, Bld: 101 mg/dL — ABNORMAL HIGH (ref 65–99)
Potassium: 4.3 mmol/L (ref 3.5–5.3)
Sodium: 139 mmol/L (ref 135–146)
Total Bilirubin: 0.2 mg/dL (ref 0.2–1.2)
Total Protein: 7.7 g/dL (ref 6.1–8.1)
eGFR: 34 mL/min/{1.73_m2} — ABNORMAL LOW (ref >=60)

## 2023-07-11 LAB — CBC WITH DIFFERENTIAL/PLATELET
Absolute Monocytes: 1020 {cells}/uL — ABNORMAL HIGH (ref 200–950)
Basophils Absolute: 68 {cells}/uL (ref 0–200)
Basophils Relative: 0.8 %
Eosinophils Absolute: 119 {cells}/uL (ref 15–500)
Eosinophils Relative: 1.4 %
HCT: 37 % (ref 35.0–45.0)
Hemoglobin: 12 g/dL (ref 11.7–15.5)
Lymphs Abs: 2287 cells/uL (ref 850–3900)
MCH: 31.5 pg (ref 27.0–33.0)
MCHC: 32.4 g/dL (ref 32.0–36.0)
MCV: 97.1 fL (ref 80.0–100.0)
MPV: 12.3 fL (ref 7.5–12.5)
Monocytes Relative: 12 %
Neutro Abs: 5007 {cells}/uL (ref 1500–7800)
Neutrophils Relative %: 58.9 %
Platelets: 202 10*3/uL (ref 140–400)
RBC: 3.81 10*6/uL (ref 3.80–5.10)
RDW: 12 % (ref 11.0–15.0)
Total Lymphocyte: 26.9 %
WBC: 8.5 10*3/uL (ref 3.8–10.8)

## 2023-07-11 LAB — URINALYSIS, ROUTINE W REFLEX MICROSCOPIC
Bilirubin Urine: NEGATIVE
Glucose, UA: NEGATIVE
Ketones, ur: NEGATIVE
Nitrite: NEGATIVE
Specific Gravity, Urine: 1.012 (ref 1.001–1.035)
WBC, UA: >=60 /HPF — AB (ref 0–5)
pH: 6 (ref 5.0–8.0)

## 2023-07-11 LAB — HEMOGLOBIN A1C
Hgb A1c MFr Bld: 5.3 %{Hb} (ref <5.7)
Mean Plasma Glucose: 105 mg/dL
eAG (mmol/L): 5.8 mmol/L

## 2023-07-11 LAB — LIPID PANEL
Cholesterol: 155 mg/dL (ref <200)
HDL: 49 mg/dL — ABNORMAL LOW (ref >=50)
LDL Cholesterol (Calc): 81 mg/dL
Non-HDL Cholesterol (Calc): 106 mg/dL (ref <130)
Total CHOL/HDL Ratio: 3.2 (calc) (ref <5.0)
Triglycerides: 152 mg/dL — ABNORMAL HIGH (ref <150)

## 2023-07-11 LAB — T4, FREE: Free T4: 0.8 ng/dL (ref 0.8–1.8)

## 2023-07-11 LAB — MICROSCOPIC MESSAGE

## 2023-07-11 LAB — TSH: TSH: 1.69 m[IU]/L (ref 0.40–4.50)

## 2023-07-11 MED ORDER — NITROFURANTOIN MONOHYD MACRO 100 MG PO CAPS: 100.0000 mg | ORAL_CAPSULE | Freq: Two times a day (BID) | ORAL | 0 refills | Status: DC

## 2023-07-11 NOTE — Telephone Encounter (Signed)
Copied from CRM [id number]. Topic: General - Other >> Jul 11, 2023  3:34 PM Clide Dales wrote: Patient called to see if an antibiotic had been sent in for her uti. Patient states that nitrofurantoin, macrocrystal-monohydrate, (MACROBID) 100 MG capsule makes her sick and she usually takes Cipro. Patient wants to know if a different medication can be sent in. Please advise.

## 2023-07-11 NOTE — Telephone Encounter (Signed)
Please review.  Dr. Althea Charon order a UA on her yesterday when she came in for lab work.  Pt states it is showing infection and would like Cipro called in.    Thanks,   -Vernona Rieger

## 2023-07-12 MED ORDER — CIPROFLOXACIN HCL 500 MG PO TABS: 500.0000 mg | ORAL_TABLET | Freq: Two times a day (BID) | ORAL | 0 refills | Status: AC

## 2023-07-12 NOTE — Telephone Encounter (Signed)
Requested Prescriptions  Pending Prescriptions Disp Refills   omeprazole (PRILOSEC) 20 MG capsule [Pharmacy Med Name: OMEPRAZOLE 20MG  CAPSULES] 90 capsule 0    Sig: TAKE 1 CAPSULE(20 MG) BY MOUTH DAILY     Gastroenterology: Proton Pump Inhibitors Passed - 07/11/2023  2:14 PM      Passed - Valid encounter within last 12 months    Recent Outpatient Visits           5 months ago Mixed stress and urge urinary incontinence   [place] [facility] Smitty Cords, DO   11 months ago Essential hypertension   [place] [facility] Smitty Cords, DO   1 year ago Acquired hypothyroidism   [place] [facility] Smitty Cords, DO   1 year ago Centrilobular emphysema [facility])   [place] [facility] Smitty Cords, DO   2 years ago Acute cystitis with hematuria    [facility] Althea Charon, Netta Neat, DO       Future Appointments             In 6 days Althea Charon, Netta Neat, DO  [place], [facility]

## 2023-07-12 NOTE — Telephone Encounter (Signed)
Cipro sent to pharmacy 

## 2023-07-18 ENCOUNTER — Ambulatory Visit (INDEPENDENT_AMBULATORY_CARE_PROVIDER_SITE_OTHER): Payer: Medicare Other | Admitting: Family Medicine

## 2023-07-18 VITALS — BP 150/78 | HR 73 | Ht 60.25 in | Wt 129.4 lb

## 2023-07-18 DIAGNOSIS — R7303 Prediabetes: Secondary | ICD-10-CM | POA: Diagnosis not present

## 2023-07-18 DIAGNOSIS — I700 Atherosclerosis of aorta: Secondary | ICD-10-CM

## 2023-07-18 DIAGNOSIS — J432 Centrilobular emphysema: Secondary | ICD-10-CM | POA: Diagnosis not present

## 2023-07-18 DIAGNOSIS — I10 Essential (primary) hypertension: Secondary | ICD-10-CM

## 2023-07-18 DIAGNOSIS — I480 Paroxysmal atrial fibrillation: Secondary | ICD-10-CM | POA: Diagnosis not present

## 2023-07-18 DIAGNOSIS — Z0000 Encounter for general adult medical examination without abnormal findings: Secondary | ICD-10-CM | POA: Diagnosis not present

## 2023-07-18 DIAGNOSIS — Z87440 Personal history of urinary (tract) infections: Secondary | ICD-10-CM

## 2023-07-18 DIAGNOSIS — N3946 Mixed incontinence: Secondary | ICD-10-CM | POA: Diagnosis not present

## 2023-07-18 DIAGNOSIS — M059 Rheumatoid arthritis with rheumatoid factor, unspecified: Secondary | ICD-10-CM

## 2023-07-18 DIAGNOSIS — E039 Hypothyroidism, unspecified: Secondary | ICD-10-CM

## 2023-07-18 DIAGNOSIS — Z23 Encounter for immunization: Secondary | ICD-10-CM | POA: Diagnosis not present

## 2023-07-18 DIAGNOSIS — N1832 Chronic kidney disease, stage 3b: Secondary | ICD-10-CM

## 2023-07-18 NOTE — Assessment & Plan Note (Signed)
Last labs normalized TSH T4 Remains OFF Levothyroxine

## 2023-07-18 NOTE — Assessment & Plan Note (Signed)
Stable, without exacerbation Not currently in AFib Controlled HR on diltiazem

## 2023-07-18 NOTE — Progress Notes (Signed)
Subjective:    Patient ID: Alexis Owens, female    DOB: 27-Aug-1941, 82 y.o.   MRN: [id number]  Alexis Owens is a 82 y.o. female presenting on 07/18/2023 for Annual Exam   HPI  Here for Annual Physical  Urinary Incontinence, mixed Not want to go back to Urology Ins not covering Myrbetriq Off Oxybutynin due to side effects   Rheumatoid Arthritis OA Followed by [facility] [place] Rheumatology Dr Allena Katz Managing her Tramadol PRN pain. On Plaquenil   Nauesa episodic  Elevated A1c previously now resolved to 5.3, prior 5.7 Improved healthy diet   HTN CKD IIIb Nephrology Dr Micki Riley Similar to prior 02/2023 lab 1.5 Last lab panel shows Cr 1.52, GFR 34 Continues on Diltiazem 120mg  daily She has self discontinued Lisinopril 20mg  daily   Hypothyroidism Last evaluation 01/2023 she had stopped taking Levothyroxine daily Now on labs she has normalized TSH and T4 off of medication   Paroxysmal Atrial Fibrillation S/p Ablation'   Prefers to avoid some medication over time Taking Gabapentin 300mg  QHS only now, she has discontinued the Gabapentin 100mg  twice a day during morning, and afternoon.   Centrilobular Emphysema Gradual increase frequency of Albuterol use Not on maintenance therapy previously Unable to afford Breztri long term, copay $40+ she would like new sample.  Abnormal Weight Gain Recently increased gain due to sedentary following fall and R leg injury  UTI Recently identified on Urinalysis and she was treated with Cipro 500 TWICE A DAY x 5 days with resolution  Abdominal Bloating / Constipation Takes OTC stool softener. She has BM every 3-4 days has harder smaller pellets with some straining and bloating. She tries Fiber supplement Has some nausea due to other medicines, Toradol etc, and takes Phenergan  Right Leg injury from fall  Aortic Atherosclerosis Identified on prior imaging, documented 09/15/20 CT Chest She is not on statin therapy currently.  Health  Maintenance: Flu Shot today Declines COVID Pneumonia vaccines     07/18/2023    8:20 AM 01/14/2023    8:28 AM 08/13/2022    9:45 AM  Depression screen PHQ 2/9  Decreased Interest 0 0 0  Down, Depressed, Hopeless 0 0 0  PHQ - 2 Score 0 0 0  Altered sleeping 0 1 1  Tired, decreased energy 1 0 1  Change in appetite 0 0 0  Feeling bad or failure about yourself  0 0 0  Trouble concentrating 0 0 0  Moving slowly or fidgety/restless 0 0 0  Suicidal thoughts 0 0 0  PHQ-9 Score 1 1 2   Difficult doing work/chores  Not difficult at all Not difficult at all    Past Medical History:  Diagnosis Date   Allergy    Arthritis    Bunion    Chronic sinusitis    COPD (chronic obstructive pulmonary disease) (HCC)    High risk medication use    Hyperlipidemia    Hypertension    Hypothyroidism    Insomnia    Macular degeneration    Osteoporosis    Paroxysmal A-fib (HCC)    Prediabetes    Rheumatoid arthritis (HCC)    Past Surgical History:  Procedure Laterality Date   APPENDECTOMY     HIP ARTHROPLASTY Left 12/18/2017   Procedure: ARTHROPLASTY BIPOLAR HIP (HEMIARTHROPLASTY);  Surgeon: Christena Flake, MD;  Location: [facility] ORS;  Service: Orthopedics;  Laterality: Left;   LEG SURGERY     Rt leg surgery for Compound fracture of the Right tibia    Social History  Socioeconomic History   Marital status: Married    Spouse name: Not on file   Number of children: Not on file   Years of education: Not on file   Highest education level: Not on file  Occupational History   Occupation: retired  Tobacco Use   Smoking status: Former    Current packs/day: 0.00    Types: Cigarettes    Quit date: 07/19/2014    Years since quitting: 9.0   Smokeless tobacco: Never  Vaping Use   Vaping status: Never Used  Substance and Sexual Activity   Alcohol use: Not Currently    Alcohol/week: 14.0 standard drinks of alcohol    Types: 14 Glasses of wine per week   Drug use: No   Sexual activity: Not on file   Other Topics Concern   Not on file  Social History Narrative   Not on file   Social Determinants of Health   Financial Resource Strain: Low Risk  (01/05/2022)   Overall Financial Resource Strain (CARDIA)    Difficulty of Paying Living Expenses: Not hard at all  Food Insecurity: No Food Insecurity (01/05/2022)   Hunger Vital Sign    Worried About Running Out of Food in the Last Year: Never true    Ran Out of Food in the Last Year: Never true  Transportation Needs: No Transportation Needs (01/05/2022)   PRAPARE - Administrator, Civil Service (Medical): No    Lack of Transportation (Non-Medical): No  Physical Activity: Insufficiently Active (01/05/2022)   Exercise Vital Sign    Days of Exercise per Week: 7 days    Minutes of Exercise per Session: 20 min  Stress: No Stress Concern Present (01/05/2022)   [employer] of Occupational Health - Occupational Stress Questionnaire    Feeling of Stress : Not at all  Social Connections: Moderately Isolated (01/05/2022)   Social Connection and Isolation Panel [NHANES]    Frequency of Communication with Friends and Family: More than three times a week    Frequency of Social Gatherings with Friends and Family: Once a week    Attends Religious Services: Never    Database administrator or Organizations: No    Attends Banker Meetings: Never    Marital Status: Married  Catering manager Violence: Not At Risk (01/05/2022)   Humiliation, Afraid, Rape, and Kick questionnaire    Fear of Current or Ex-Partner: No    Emotionally Abused: No    Physically Abused: No    Sexually Abused: No   Family History  Problem Relation Age of Onset   Breast cancer Sister    Heart disease Brother    Diabetes Brother    Osteoarthritis Mother    Lung disease Father    Current Outpatient Medications on File Prior to Visit  Medication Sig   acetaminophen (TYLENOL) 650 MG CR tablet Take by mouth.   albuterol (VENTOLIN HFA) 108 (90 Base)  MCG/ACT inhaler INHALE 1 TO 2 PUFFS INTO THE LUNGS EVERY 6 HOURS AS NEEDED FOR WHEEZING OR SHORTNESS OF BREATH   diltiazem (CARDIZEM CD) 120 MG 24 hr capsule Take 1 capsule (120 mg total) by mouth daily.   fluticasone (FLONASE) 50 MCG/ACT nasal spray Place 2 sprays into both nostrils daily.   gabapentin (NEURONTIN) 300 MG capsule Take 1 capsule (300 mg total) by mouth at bedtime.   hydroxychloroquine (PLAQUENIL) 200 MG tablet Take 1.5 tablets by mouth daily.   lisinopril (ZESTRIL) 20 MG tablet TAKE 1 TABLET BY  MOUTH DAILY   omeprazole (PRILOSEC) 20 MG capsule TAKE 1 CAPSULE(20 MG) BY MOUTH DAILY   promethazine (PHENERGAN) 12.5 MG tablet Take 1-2 tablets (12.5-25 mg total) by mouth every 8 (eight) hours as needed for nausea or vomiting.   traMADol (ULTRAM) 50 MG tablet Take by mouth.   traZODone (DESYREL) 50 MG tablet Take 1 tablet (50 mg total) by mouth at bedtime.   BREZTRI AEROSPHERE 160-9-4.8 MCG/ACT AERO Inhale 2 puffs into the lungs 2 (two) times daily. (Patient not taking: Reported on 07/18/2023)   No current facility-administered medications on file prior to visit.    Review of Systems  Constitutional:  Negative for activity change, appetite change, chills, diaphoresis, fatigue and fever.  HENT:  Negative for congestion and hearing loss.   Eyes:  Negative for visual disturbance.  Respiratory:  Negative for cough, chest tightness, shortness of breath and wheezing.   Cardiovascular:  Negative for chest pain, palpitations and leg swelling.  Gastrointestinal:  Positive for constipation. Negative for abdominal pain, diarrhea, nausea and vomiting.  Genitourinary:  Negative for dysuria, frequency and hematuria.  Musculoskeletal:  Negative for arthralgias and neck pain.  Skin:  Negative for rash.  Neurological:  Negative for dizziness, weakness, light-headedness, numbness and headaches.  Hematological:  Negative for adenopathy.  Psychiatric/Behavioral:  Negative for behavioral problems,  dysphoric mood and sleep disturbance.    Per HPI unless specifically indicated above      Objective:    BP (!) 150/78 (BP Location: Left Arm, Cuff Size: Normal)   Pulse 73   Ht 5' 0.25" (1.53 m)   Wt 129 lb 6.4 oz (58.7 kg)   SpO2 97%   BMI 25.06 kg/m   Wt Readings from Last 3 Encounters:  07/18/23 129 lb 6.4 oz (58.7 kg)  01/14/23 125 lb 3.2 oz (56.8 kg)  08/13/22 118 lb 12.8 oz (53.9 kg)    Physical Exam Vitals and nursing note reviewed.  Constitutional:      General: She is not in acute distress.    Appearance: She is well-developed. She is not diaphoretic.     Comments: Well-appearing, comfortable, cooperative  HENT:     Head: Normocephalic and atraumatic.  Eyes:     General:        Right eye: No discharge.        Left eye: No discharge.     Conjunctiva/sclera: Conjunctivae normal.     Pupils: Pupils are equal, round, and reactive to light.  Neck:     Thyroid: No thyromegaly.     Vascular: Carotid bruit (L>R) present.  Cardiovascular:     Rate and Rhythm: Normal rate and regular rhythm.     Pulses: Normal pulses.     Heart sounds: Murmur heard.  Pulmonary:     Effort: Pulmonary effort is normal. No respiratory distress.     Breath sounds: Normal breath sounds. No wheezing or rales.  Abdominal:     General: Bowel sounds are normal. There is no distension.     Palpations: Abdomen is soft. There is no mass.     Tenderness: There is no abdominal tenderness.  Musculoskeletal:        General: No tenderness. Normal range of motion.     Cervical back: Normal range of motion and neck supple.     Comments: Upper / Lower Extremities: - Normal muscle tone, strength bilateral upper extremities 5/5, lower extremities 5/5  Lymphadenopathy:     Cervical: No cervical adenopathy.  Skin:    General:  Skin is warm and dry.     Findings: No erythema or rash.  Neurological:     Mental Status: She is alert and oriented to person, place, and time.     Comments: Distal sensation  intact to light touch all extremities  Psychiatric:        Mood and Affect: Mood normal.        Behavior: Behavior normal.        Thought Content: Thought content normal.     Comments: Well groomed, good eye contact, normal speech and thoughts       Results for orders placed or performed in visit on 07/10/23  MICROSCOPIC MESSAGE  Result Value Ref Range   Note    Urinalysis, Routine w reflex microscopic  Result Value Ref Range   Color, Urine YELLOW YELLOW   APPearance TURBID (A) CLEAR   Specific Gravity, Urine 1.012 1.001 - 1.035   pH 6.0 5.0 - 8.0   Glucose, UA NEGATIVE NEGATIVE   Bilirubin Urine NEGATIVE NEGATIVE   Ketones, ur NEGATIVE NEGATIVE   Hgb urine dipstick 2+ (A) NEGATIVE   Protein, ur 2+ (A) NEGATIVE   Nitrite NEGATIVE NEGATIVE   Leukocytes,Ua 3+ (A) NEGATIVE   WBC, UA > OR = 60 (A) 0 - 5 /HPF   RBC / HPF 20-40 (A) 0 - 2 /HPF   Squamous Epithelial / HPF 20-40 (A) < OR = 5 /HPF   Bacteria, UA MANY (A) NONE SEEN /HPF   Hyaline Cast 0-5 (A) NONE SEEN /LPF  T4, free  Result Value Ref Range   Free T4 0.8 0.8 - 1.8 ng/dL  TSH  Result Value Ref Range   TSH 1.69 0.40 - 4.50 mIU/L  Hemoglobin A1c  Result Value Ref Range   Hgb A1c MFr Bld 5.3 <5.7 % of total Hgb   Mean Plasma Glucose 105 mg/dL   eAG (mmol/L) 5.8 mmol/L  Lipid panel  Result Value Ref Range   Cholesterol 155 <200 mg/dL   HDL 49 (L) > OR = 50 mg/dL   Triglycerides 295 (H) <150 mg/dL   LDL Cholesterol (Calc) 81 mg/dL (calc)   Total CHOL/HDL Ratio 3.2 <5.0 (calc)   Non-HDL Cholesterol (Calc) 106 <130 mg/dL (calc)  CBC with Differential/Platelet  Result Value Ref Range   WBC 8.5 3.8 - 10.8 Thousand/uL   RBC 3.81 3.80 - 5.10 Million/uL   Hemoglobin 12.0 11.7 - 15.5 g/dL   HCT 62.1 30.8 - 65.7 %   MCV 97.1 80.0 - 100.0 fL   MCH 31.5 27.0 - 33.0 pg   MCHC 32.4 32.0 - 36.0 g/dL   RDW 84.6 96.2 - 95.2 %   Platelets 202 140 - 400 Thousand/uL   MPV 12.3 7.5 - 12.5 fL   Neutro Abs 5,007 1,500 -  7,800 cells/uL   Lymphs Abs 2,287 850 - 3,900 cells/uL   Absolute Monocytes 1,020 (H) 200 - 950 cells/uL   Eosinophils Absolute 119 15 - 500 cells/uL   Basophils Absolute 68 0 - 200 cells/uL   Neutrophils Relative % 58.9 %   Total Lymphocyte 26.9 %   Monocytes Relative 12.0 %   Eosinophils Relative 1.4 %   Basophils Relative 0.8 %  COMPLETE METABOLIC PANEL WITH GFR  Result Value Ref Range   Glucose, Bld 101 (H) 65 - 99 mg/dL   BUN 20 7 - 25 mg/dL   Creat 8.41 (H) 3.24 - 0.95 mg/dL   eGFR 34 (L) > OR = 60 mL/min/1.15m2  BUN/Creatinine Ratio 13 6 - 22 (calc)   Sodium 139 135 - 146 mmol/L   Potassium 4.3 3.5 - 5.3 mmol/L   Chloride 107 98 - 110 mmol/L   CO2 21 20 - 32 mmol/L   Calcium 9.4 8.6 - 10.4 mg/dL   Total Protein 7.7 6.1 - 8.1 g/dL   Albumin 3.9 3.6 - 5.1 g/dL   Globulin 3.8 (H) 1.9 - 3.7 g/dL (calc)   AG Ratio 1.0 1.0 - 2.5 (calc)   Total Bilirubin 0.2 0.2 - 1.2 mg/dL   Alkaline phosphatase (APISO) 90 37 - 153 U/L   AST 14 10 - 35 U/L   ALT 10 6 - 29 U/L      Assessment & Plan:   Problem List Items Addressed This Visit     Aortic atherosclerosis (HCC)    Identified on prior CT imaging Offered Statin therapy Has declined      Centrilobular emphysema (HCC)    Stable without exacerbation Chronic problem. On Albuterol Inhaler PRN only Failed Breztri due to higher cost Will give sample today      Chronic kidney disease, stage 3b (HCC)    Creatinine elevated 1.5 Remains stable in past 6 months Followed by Nephrology eGFR >30      Hypothyroidism    Last labs normalized TSH T4 Remains OFF Levothyroxine      Paroxysmal A-fib (HCC)    Stable, without exacerbation Not currently in AFib Controlled HR on diltiazem      Prediabetes    Resolved      Seropositive rheumatoid arthritis (HCC)    Stable  On Plaquenil Followed by [facility] Rheum      Other Visit Diagnoses     Annual physical exam    -  Primary   Mixed stress and urge urinary incontinence        History of recurrent UTI (urinary tract infection)       Essential hypertension       Needs flu shot       Relevant Orders   Flu Vaccine Trivalent High Dose (Fluad) (Completed)       Updated Health Maintenance information Reviewed recent lab results with patient Encouraged improvement to lifestyle with diet and exercise Goal of weight loss   No orders of the defined types were placed in this encounter.     Follow up plan: Return in about 6 months (around 01/15/2024) for 6 month follow-up HTN CKD, updates.  Saralyn Pilar, DO [facility] 07/18/2023, 8:17 AM

## 2023-07-18 NOTE — Assessment & Plan Note (Signed)
Creatinine elevated 1.5 Remains stable in past 6 months Followed by Nephrology eGFR >30

## 2023-07-18 NOTE — Assessment & Plan Note (Signed)
Resolved

## 2023-07-18 NOTE — Assessment & Plan Note (Signed)
Stable without exacerbation Chronic problem. On Albuterol Inhaler PRN only Failed Breztri due to higher cost Will give sample today

## 2023-07-18 NOTE — Assessment & Plan Note (Signed)
Identified on prior CT imaging Offered Statin therapy Has declined

## 2023-07-18 NOTE — Assessment & Plan Note (Addendum)
Stable  On Plaquenil Followed by [facility]

## 2023-07-18 NOTE — Patient Instructions (Addendum)
Thank you for coming to the office today.  Restart Lisinopril daily  For Constipation (less frequent bowel movement that can be hard dry or involve straining).  Recommend trying OTC Miralax 17g = 1 capful in large glass water once daily for now, try several days to see if working, goal is soft stool or BM 1-2 times daily, if too loose then reduce dose or try every other day. If not effective may need to increase it to 2 doses at once in AM or may do 1 in morning and 1 in afternoon/evening  - This medicine is very safe and can be used often without any problem and will not make you dehydrated. It is good for use on AS NEEDED BASIS or even MAINTENANCE therapy for longer term for several days to weeks at a time to help regulate bowel movements  Other more natural remedies or preventative treatment: - Increase hydration with water - Increase fiber in diet (high fiber foods = vegetables, leafy greens, oats/grains) - May take OTC Fiber supplement (metamucil powder or pill/gummy) - May try OTC Probiotic  DUE for FASTING BLOOD WORK (no food or drink after midnight before the lab appointment, only water or coffee without cream/sugar on the morning of)  SCHEDULE "Lab Only" visit in the morning at the clinic for lab draw in 6 months  - Make sure Lab Only appointment is at about 1 week before your next appointment, so that results will be available  For Lab Results, once available within 2-3 days of blood draw, you can can log in to MyChart online to view your results and a brief explanation. Also, we can discuss results at next follow-up visit.   Please schedule a Follow-up Appointment to: Return in about 6 months (around 01/15/2024) for 6 month follow-up HTN CKD, updates.  If you have any other questions or concerns, please feel free to call the office or send a message through MyChart. You may also schedule an earlier appointment if necessary.  Additionally, you may be receiving a survey about your  experience at our office within a few days to 1 week by e-mail or mail. We value your feedback.  Saralyn Pilar, DO [facility], [place]

## 2023-07-26 ENCOUNTER — Other Ambulatory Visit: Payer: Self-pay | Admitting: Family Medicine

## 2023-07-26 DIAGNOSIS — I10 Essential (primary) hypertension: Secondary | ICD-10-CM

## 2023-07-26 DIAGNOSIS — K219 Gastro-esophageal reflux disease without esophagitis: Secondary | ICD-10-CM

## 2023-07-26 NOTE — Telephone Encounter (Signed)
Last RF 07/12/23 #90 (omeprazole)   Last RF 01/14/23 #90 3 RF  (diltiazem)  Requested Prescriptions  Refused Prescriptions Disp Refills   diltiazem (CARDIZEM CD) 120 MG 24 hr capsule [Pharmacy Med Name: DILTIAZEM CD 120MG  CAPSULES (24 HR)] 90 capsule 3    Sig: TAKE 1 CAPSULE(120 MG) BY MOUTH DAILY     Cardiovascular: Calcium Channel Blockers 3 Failed - 07/26/2023  8:50 AM      Failed - Cr in normal range and within 360 days    Creat  Date Value Ref Range Status  07/10/2023 1.52 (H) 0.60 - 0.95 mg/dL Final         Failed - Last BP in normal range    BP Readings from Last 1 Encounters:  07/18/23 (!) 150/78         Passed - ALT in normal range and within 360 days    ALT  Date Value Ref Range Status  07/10/2023 10 6 - 29 U/L Final         Passed - AST in normal range and within 360 days    AST  Date Value Ref Range Status  07/10/2023 14 10 - 35 U/L Final         Passed - Last Heart Rate in normal range    Pulse Readings from Last 1 Encounters:  07/18/23 73         Passed - Valid encounter within last 6 months    Recent Outpatient Visits           1 week ago Annual physical exam   [place] [facility] [place], Netta Neat, DO   6 months ago Mixed stress and urge urinary incontinence   [place] [facility] Smitty Cords, DO   11 months ago Essential hypertension   [place] [facility] Smitty Cords, DO   1 year ago Acquired hypothyroidism   [place] [facility] Smitty Cords, DO   1 year ago Centrilobular emphysema [facility])   [place] [facility] Smitty Cords, DO       Future Appointments             In 5 months Althea Charon, Netta Neat, DO [place] [facility]             omeprazole (PRILOSEC) 20 MG capsule [Pharmacy Med Name: OMEPRAZOLE 20MG  CAPSULES] 90 capsule 0    Sig: TAKE 1 CAPSULE(20 MG) BY  MOUTH DAILY     Gastroenterology: Proton Pump Inhibitors Passed - 07/26/2023  8:50 AM      Passed - Valid encounter within last 12 months    Recent Outpatient Visits           1 week ago Annual physical exam   [place] [facility] [place], Netta Neat, DO   6 months ago Mixed stress and urge urinary incontinence   [place] [facility] Smitty Cords, DO   11 months ago Essential hypertension   [place] [facility] Smitty Cords, DO   1 year ago Acquired hypothyroidism    [facility] Smitty Cords, DO   1 year ago Centrilobular emphysema [facility])    [facility] Smitty Cords, DO       Future Appointments             In 5  months Althea Charon Netta Neat, DO [place] [facility], [facility]

## 2023-08-15 DIAGNOSIS — M792 Neuralgia and neuritis, unspecified: Secondary | ICD-10-CM | POA: Diagnosis not present

## 2023-08-15 DIAGNOSIS — M80052S Age-related osteoporosis with current pathological fracture, left femur, sequela: Secondary | ICD-10-CM | POA: Diagnosis not present

## 2023-08-15 DIAGNOSIS — M150 Primary generalized (osteo)arthritis: Secondary | ICD-10-CM | POA: Diagnosis not present

## 2023-08-15 DIAGNOSIS — M0579 Rheumatoid arthritis with rheumatoid factor of multiple sites without organ or systems involvement: Secondary | ICD-10-CM | POA: Diagnosis not present

## 2023-08-15 DIAGNOSIS — Z79899 Other long term (current) drug therapy: Secondary | ICD-10-CM | POA: Diagnosis not present

## 2023-08-19 ENCOUNTER — Other Ambulatory Visit: Payer: Self-pay | Admitting: Family Medicine

## 2023-08-19 DIAGNOSIS — K219 Gastro-esophageal reflux disease without esophagitis: Secondary | ICD-10-CM

## 2023-08-19 MED ORDER — OMEPRAZOLE 20 MG PO CPDR
DELAYED_RELEASE_CAPSULE | ORAL | 1 refills | Status: DC
Start: 2023-08-19 — End: 2024-01-15

## 2023-09-17 DIAGNOSIS — J449 Chronic obstructive pulmonary disease, unspecified: Secondary | ICD-10-CM | POA: Diagnosis not present

## 2023-09-17 DIAGNOSIS — I10 Essential (primary) hypertension: Secondary | ICD-10-CM | POA: Diagnosis not present

## 2023-09-17 DIAGNOSIS — R809 Proteinuria, unspecified: Secondary | ICD-10-CM | POA: Diagnosis not present

## 2023-09-17 DIAGNOSIS — I480 Paroxysmal atrial fibrillation: Secondary | ICD-10-CM | POA: Diagnosis not present

## 2023-09-17 DIAGNOSIS — D472 Monoclonal gammopathy: Secondary | ICD-10-CM | POA: Diagnosis not present

## 2023-09-24 DIAGNOSIS — E039 Hypothyroidism, unspecified: Secondary | ICD-10-CM | POA: Diagnosis not present

## 2023-09-24 DIAGNOSIS — N1330 Unspecified hydronephrosis: Secondary | ICD-10-CM | POA: Diagnosis not present

## 2023-09-24 DIAGNOSIS — I480 Paroxysmal atrial fibrillation: Secondary | ICD-10-CM | POA: Diagnosis not present

## 2023-09-24 DIAGNOSIS — E785 Hyperlipidemia, unspecified: Secondary | ICD-10-CM | POA: Diagnosis not present

## 2023-09-24 DIAGNOSIS — R809 Proteinuria, unspecified: Secondary | ICD-10-CM | POA: Diagnosis not present

## 2023-09-24 DIAGNOSIS — M150 Primary generalized (osteo)arthritis: Secondary | ICD-10-CM | POA: Diagnosis not present

## 2023-09-24 DIAGNOSIS — D472 Monoclonal gammopathy: Secondary | ICD-10-CM | POA: Diagnosis not present

## 2023-09-24 DIAGNOSIS — J449 Chronic obstructive pulmonary disease, unspecified: Secondary | ICD-10-CM | POA: Diagnosis not present

## 2023-09-24 DIAGNOSIS — D539 Nutritional anemia, unspecified: Secondary | ICD-10-CM | POA: Diagnosis not present

## 2023-09-24 DIAGNOSIS — G4709 Other insomnia: Secondary | ICD-10-CM | POA: Diagnosis not present

## 2023-09-24 DIAGNOSIS — R7303 Prediabetes: Secondary | ICD-10-CM | POA: Diagnosis not present

## 2023-09-24 DIAGNOSIS — I10 Essential (primary) hypertension: Secondary | ICD-10-CM | POA: Diagnosis not present

## 2023-10-02 ENCOUNTER — Other Ambulatory Visit: Payer: Self-pay | Admitting: Family Medicine

## 2023-10-02 DIAGNOSIS — R110 Nausea: Secondary | ICD-10-CM

## 2023-10-03 NOTE — Telephone Encounter (Signed)
Requested medications are due for refill today.  yes  Requested medications are on the active medications list.  yes  Last refill. 01/14/2023 #90 3 rf  Future visit scheduled.   yes  Notes to clinic.  Refill not delegated.    Requested Prescriptions  Pending Prescriptions Disp Refills   promethazine (PHENERGAN) 12.5 MG tablet [Pharmacy Med Name: PROMETHAZINE 12.5MG  TABLETS] 90 tablet 3    Sig: TAKE 1 TO 2 TABLETS(12.5 TO 25 MG) BY MOUTH EVERY 8 HOURS AS NEEDED FOR NAUSEA OR VOMITING     Not Delegated - Gastroenterology: Antiemetics Failed - 10/02/2023  9:20 AM      Failed - This refill cannot be delegated      Passed - Valid encounter within last 6 months    Recent Outpatient Visits           2 months ago Annual physical exam   [place] [facility] [place], Netta Neat, DO   8 months ago Mixed stress and urge urinary incontinence   [place] [facility] Althea Charon, Netta Neat, DO   1 year ago Essential hypertension   [place] [facility] Smitty Cords, DO   1 year ago Acquired hypothyroidism   [place] [facility] Smitty Cords, DO   1 year ago Centrilobular emphysema [facility])   [place] [facility] Smitty Cords, DO       Future Appointments             In 3 months Althea Charon, Netta Neat, DO [place] [facility], [facility]

## 2023-10-06 ENCOUNTER — Encounter (INDEPENDENT_AMBULATORY_CARE_PROVIDER_SITE_OTHER): Payer: Medicare Other | Admitting: Family Medicine

## 2023-10-06 DIAGNOSIS — R110 Nausea: Secondary | ICD-10-CM | POA: Diagnosis not present

## 2023-10-07 MED ORDER — ONDANSETRON 4 MG PO TBDP: 4.0000 mg | ORAL_TABLET | Freq: Three times a day (TID) | ORAL | 0 refills | Status: AC | PRN

## 2023-10-07 NOTE — Telephone Encounter (Signed)
Please see the MyChart message reply(ies) for my assessment and plan.    This patient gave consent for this Medical Advice Message and is aware that it may result in a bill to [employer], as well as the possibility of receiving a bill for a co-payment or deductible. They are an established patient, but are not seeking medical advice exclusively about a problem treated during an in person or video visit in the last seven days. I did not recommend an in person or video visit within seven days of my reply.    I spent a total of 7 minutes cumulative time within 7 days through [employer].  Saralyn Pilar, DO

## 2023-10-28 ENCOUNTER — Other Ambulatory Visit: Payer: Self-pay | Admitting: Family Medicine

## 2023-10-28 DIAGNOSIS — M059 Rheumatoid arthritis with rheumatoid factor, unspecified: Secondary | ICD-10-CM

## 2023-10-29 NOTE — Telephone Encounter (Signed)
Requested medication (s) are due for refill today: expired medications   Requested medication (s) are on the active medication list: yes   Last refill:  08/13/22 #90 3 refills  Future visit scheduled: yes in 2 months   Notes to clinic:  expired medication . Do you want to renew Rx?     Requested Prescriptions  Pending Prescriptions Disp Refills   gabapentin (NEURONTIN) 300 MG capsule [Pharmacy Med Name: GABAPENTIN 300MG  CAPSULES] 90 capsule 3    Sig: TAKE 1 CAPSULE(300 MG) BY MOUTH AT BEDTIME     Neurology: Anticonvulsants - gabapentin Failed - 10/29/2023 10:13 AM      Failed - Cr in normal range and within 360 days    Creat  Date Value Ref Range Status  07/10/2023 1.52 (H) 0.60 - 0.95 mg/dL Final         Passed - Completed PHQ-2 or PHQ-9 in the last 360 days      Passed - Valid encounter within last 12 months    Recent Outpatient Visits           3 months ago Annual physical exam   [place] [facility] Smitty Cords, DO   9 months ago Mixed stress and urge urinary incontinence   [place] [facility] Smitty Cords, DO   1 year ago Essential hypertension   [place] [facility] Smitty Cords, DO   1 year ago Acquired hypothyroidism   [place] [facility] Smitty Cords, DO   2 years ago Centrilobular emphysema [facility])   [place] [facility] Smitty Cords, DO       Future Appointments             In 2 months Althea Charon, Netta Neat, DO  [facility], [facility]

## 2023-11-23 ENCOUNTER — Other Ambulatory Visit: Payer: Self-pay | Admitting: Family Medicine

## 2023-11-23 DIAGNOSIS — R059 Cough, unspecified: Secondary | ICD-10-CM

## 2023-11-25 ENCOUNTER — Other Ambulatory Visit: Payer: Self-pay

## 2023-11-25 DIAGNOSIS — R059 Cough, unspecified: Secondary | ICD-10-CM

## 2023-11-25 MED ORDER — ALBUTEROL SULFATE HFA 108 (90 BASE) MCG/ACT IN AERS: INHALATION_SPRAY | RESPIRATORY_TRACT | 2 refills | Status: DC

## 2023-11-26 NOTE — Telephone Encounter (Signed)
 Refused albuterol inhaler because this is a duplicate request.

## 2023-12-17 DIAGNOSIS — Z79899 Other long term (current) drug therapy: Secondary | ICD-10-CM | POA: Diagnosis not present

## 2023-12-17 DIAGNOSIS — M159 Polyosteoarthritis, unspecified: Secondary | ICD-10-CM | POA: Diagnosis not present

## 2023-12-17 DIAGNOSIS — M0579 Rheumatoid arthritis with rheumatoid factor of multiple sites without organ or systems involvement: Secondary | ICD-10-CM | POA: Diagnosis not present

## 2023-12-24 ENCOUNTER — Other Ambulatory Visit: Payer: Self-pay | Admitting: Family Medicine

## 2023-12-24 DIAGNOSIS — I10 Essential (primary) hypertension: Secondary | ICD-10-CM

## 2023-12-24 DIAGNOSIS — D539 Nutritional anemia, unspecified: Secondary | ICD-10-CM

## 2023-12-24 DIAGNOSIS — N1832 Chronic kidney disease, stage 3b: Secondary | ICD-10-CM

## 2023-12-24 DIAGNOSIS — E039 Hypothyroidism, unspecified: Secondary | ICD-10-CM

## 2024-01-10 ENCOUNTER — Other Ambulatory Visit: Payer: Medicare Other

## 2024-01-10 DIAGNOSIS — D539 Nutritional anemia, unspecified: Secondary | ICD-10-CM

## 2024-01-10 DIAGNOSIS — E039 Hypothyroidism, unspecified: Secondary | ICD-10-CM

## 2024-01-10 DIAGNOSIS — N1832 Chronic kidney disease, stage 3b: Secondary | ICD-10-CM

## 2024-01-10 DIAGNOSIS — I10 Essential (primary) hypertension: Secondary | ICD-10-CM

## 2024-01-11 LAB — COMPLETE METABOLIC PANEL WITH GFR
AG Ratio: 1 (calc) (ref 1.0–2.5)
ALT: 7 U/L (ref 6–29)
AST: 15 U/L (ref 10–35)
Albumin: 4 g/dL (ref 3.6–5.1)
Alkaline phosphatase (APISO): 99 U/L (ref 37–153)
BUN/Creatinine Ratio: 15 (calc) (ref 6–22)
BUN: 31 mg/dL — ABNORMAL HIGH (ref 7–25)
CO2: 24 mmol/L (ref 20–32)
Calcium: 9.4 mg/dL (ref 8.6–10.4)
Chloride: 105 mmol/L (ref 98–110)
Creat: 2.03 mg/dL — ABNORMAL HIGH (ref 0.60–0.95)
Globulin: 3.9 g/dL — ABNORMAL HIGH (ref 1.9–3.7)
Glucose, Bld: 86 mg/dL (ref 65–99)
Potassium: 4.9 mmol/L (ref 3.5–5.3)
Sodium: 137 mmol/L (ref 135–146)
Total Bilirubin: 0.3 mg/dL (ref 0.2–1.2)
Total Protein: 7.9 g/dL (ref 6.1–8.1)
eGFR: 24 mL/min/{1.73_m2} — ABNORMAL LOW (ref >=60)

## 2024-01-11 LAB — CBC WITH DIFFERENTIAL/PLATELET
Absolute Lymphocytes: 2804 {cells}/uL (ref 850–3900)
Absolute Monocytes: 607 {cells}/uL (ref 200–950)
Basophils Absolute: 98 {cells}/uL (ref 0–200)
Basophils Relative: 1.2 %
Eosinophils Absolute: 123 {cells}/uL (ref 15–500)
Eosinophils Relative: 1.5 %
HCT: 35.9 % (ref 35.0–45.0)
Hemoglobin: 11.3 g/dL — ABNORMAL LOW (ref 11.7–15.5)
MCH: 30.4 pg (ref 27.0–33.0)
MCHC: 31.5 g/dL — ABNORMAL LOW (ref 32.0–36.0)
MCV: 96.5 fL (ref 80.0–100.0)
MPV: 12.3 fL (ref 7.5–12.5)
Monocytes Relative: 7.4 %
Neutro Abs: 4567 {cells}/uL (ref 1500–7800)
Neutrophils Relative %: 55.7 %
Platelets: 240 10*3/uL (ref 140–400)
RBC: 3.72 10*6/uL — ABNORMAL LOW (ref 3.80–5.10)
RDW: 11.7 % (ref 11.0–15.0)
Total Lymphocyte: 34.2 %
WBC: 8.2 10*3/uL (ref 3.8–10.8)

## 2024-01-11 LAB — T4, FREE: Free T4: 0.8 ng/dL (ref 0.8–1.8)

## 2024-01-11 LAB — TSH: TSH: 2.26 m[IU]/L (ref 0.40–4.50)

## 2024-01-15 ENCOUNTER — Ambulatory Visit (INDEPENDENT_AMBULATORY_CARE_PROVIDER_SITE_OTHER): Payer: Medicare Other | Admitting: Family Medicine

## 2024-01-15 ENCOUNTER — Encounter: Payer: Self-pay | Admitting: Family Medicine

## 2024-01-15 VITALS — BP 138/84 | HR 44 | Ht 60.25 in | Wt 121.0 lb

## 2024-01-15 DIAGNOSIS — M059 Rheumatoid arthritis with rheumatoid factor, unspecified: Secondary | ICD-10-CM

## 2024-01-15 DIAGNOSIS — I480 Paroxysmal atrial fibrillation: Secondary | ICD-10-CM

## 2024-01-15 DIAGNOSIS — I700 Atherosclerosis of aorta: Secondary | ICD-10-CM | POA: Diagnosis not present

## 2024-01-15 DIAGNOSIS — J432 Centrilobular emphysema: Secondary | ICD-10-CM

## 2024-01-15 DIAGNOSIS — K219 Gastro-esophageal reflux disease without esophagitis: Secondary | ICD-10-CM | POA: Diagnosis not present

## 2024-01-15 DIAGNOSIS — G4709 Other insomnia: Secondary | ICD-10-CM

## 2024-01-15 DIAGNOSIS — I10 Essential (primary) hypertension: Secondary | ICD-10-CM

## 2024-01-15 DIAGNOSIS — J0110 Acute frontal sinusitis, unspecified: Secondary | ICD-10-CM | POA: Diagnosis not present

## 2024-01-15 DIAGNOSIS — J309 Allergic rhinitis, unspecified: Secondary | ICD-10-CM | POA: Diagnosis not present

## 2024-01-15 DIAGNOSIS — N1832 Chronic kidney disease, stage 3b: Secondary | ICD-10-CM

## 2024-01-15 MED ORDER — FLUTICASONE PROPIONATE 50 MCG/ACT NA SUSP: 2.0000 | Freq: Every day | NASAL | 3 refills | Status: DC

## 2024-01-15 MED ORDER — LEVOFLOXACIN 250 MG PO TABS: ORAL_TABLET | ORAL | 0 refills | Status: DC

## 2024-01-15 MED ORDER — OMEPRAZOLE 20 MG PO CPDR: DELAYED_RELEASE_CAPSULE | ORAL | 1 refills | Status: DC

## 2024-01-15 MED ORDER — LEVOFLOXACIN 500 MG PO TABS
500.0000 mg | ORAL_TABLET | Freq: Every day | ORAL | 0 refills | Status: DC
Start: 2024-01-15 — End: 2024-01-15

## 2024-01-15 MED ORDER — BREZTRI AEROSPHERE 160-9-4.8 MCG/ACT IN AERO: 2.0000 | INHALATION_SPRAY | Freq: Two times a day (BID) | RESPIRATORY_TRACT | 11 refills | Status: DC

## 2024-01-15 MED ORDER — DILTIAZEM HCL ER COATED BEADS 120 MG PO CP24: 120.0000 mg | ORAL_CAPSULE | Freq: Every day | ORAL | 3 refills | Status: AC

## 2024-01-15 MED ORDER — GABAPENTIN 300 MG PO CAPS: 300.0000 mg | ORAL_CAPSULE | Freq: Every day | ORAL | 3 refills | Status: DC

## 2024-01-15 MED ORDER — TRAZODONE HCL 50 MG PO TABS
50.0000 mg | ORAL_TABLET | Freq: Every day | ORAL | 3 refills | Status: DC
Start: 2024-01-15 — End: 2024-07-17

## 2024-01-15 NOTE — Patient Instructions (Addendum)
 Thank you for coming to the office today.  Refill Breztri to pharmacy and sample today  Start taking Levaquin antibiotic 500mg  first dose and then 250 mg every 24 hours   All other refills  Route note and fax results to Dr Suezanne Jacquet  Please schedule a Follow-up Appointment to: Return in about 6 months (around 07/17/2024) for 6 months follow-up HTN CKD COPD.  If you have any other questions or concerns, please feel free to call the office or send a message through MyChart. You may also schedule an earlier appointment if necessary.  Additionally, you may be receiving a survey about your experience at our office within a few days to 1 week by e-mail or mail. We value your feedback.  Saralyn Pilar, DO [facility], [place]

## 2024-01-15 NOTE — Progress Notes (Signed)
 Subjective:    Patient ID: Alexis Owens, female    DOB: 1941/02/17, 83 y.o.   MRN: [id number]  Alexis Owens is a 83 y.o. female presenting on 01/15/2024 for COPD and Chronic Kidney Disease   HPI  Discussed the use of AI scribe software for clinical note transcription with the patient, who gave verbal consent to proceed.  History of Present Illness   Alexis Owens is an 83 year old female with COPD who presents with chronic cough and sinus congestion.  Sinusitis She has been experiencing a persistent cough and sinus congestion for the past few months. The cough seems to originate from her lungs or possibly due to drainage, causing her to choke. Symptoms are present throughout the day. She uses Flonase, Vicks at night with a heating pad, and takes hot showers to alleviate symptoms. She also uses lemon and honey as home remedies. Her nose gets clogged, and she has to gag and spit mucus, which varies in clarity.  Emphysema COPD She has a history of COPD and previously used Breztri inhaler, which was effective but is currently unaffordable due to a $40 copay. Her husband is not working, impacting her financial situation. She declines participation in a patient assistance program. With Markus Daft, she does not need to use her rescue inhaler as frequently.  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 She is scheduled to see her kidney doctor in May. She experiences pain in the kidney area and uses a heating pad for relief. She has a history of stage 3 kidney disease, which she believes may have worsened. She experiences dry mouth and maintains a proactive approach to her health. - Continues on Diltiazem 120mg  daily  She requests refills for gabapentin 300 mg taken in the evening and trazodone. She mentions using a rescue inhaler to open her lungs when she cannot breathe.  She also uses diltiazem and fluticasone, and occasionally lisinopril and omeprazole. She takes nausea medication as needed and tries to minimize medication use.  She has a history of a bad reaction to a pneumonia shot but received a flu shot in September. She reports stable hemoglobin levels and normal thyroid function without medication. She experiences stress related to her husband's smoking and her own isolation but remains active and manages her health diligently.      Urinary Incontinence, mixed Ins not covering Myrbetriq Off Oxybutynin due to side effects   Rheumatoid Arthritis OA Followed by [facility] Rheumatology Dr Allena Katz Managing her Tramadol PRN pain. On Plaquenil    Hypothyroidism Last evaluation 01/2023 she had stopped taking Levothyroxine daily Now on labs she has normalized TSH and T4 off of medication still   Paroxysmal Atrial Fibrillation S/p Ablation'     Aortic Atherosclerosis Identified on prior imaging, documented 09/15/20 CT Chest She is not on statin therapy currently.   Health Maintenance: Declines COVID Pneumonia vaccines      01/15/2024    8:04 AM 07/18/2023    8:20 AM 01/14/2023    8:28 AM  Depression screen PHQ 2/9  Decreased Interest 0 0 0  Down, Depressed, Hopeless 0 0 0  PHQ - 2 Score 0 0 0  Altered sleeping 0 0 1  Tired, decreased energy 0 1 0  Change in appetite 0 0 0  Feeling bad or failure about yourself  0 0 0  Trouble concentrating 0 0 0  Moving slowly or  fidgety/restless 0 0 0  Suicidal thoughts 0 0 0  PHQ-9 Score 0 1 1  Difficult doing work/chores Not difficult at all  Not difficult at all       01/15/2024    8:04 AM 07/18/2023    8:20 AM 01/14/2023    8:28 AM 08/13/2022    9:46 AM  GAD 7 : Generalized Anxiety Score  Nervous, Anxious, on Edge 0 0 0 0  Control/stop worrying 0 0 0 0  Worry too much - different things 0 0 0 0  Trouble relaxing 0 0 0 0  Restless 0 0 0 0  Easily annoyed or irritable 0 0 0 0  Afraid - awful might happen  0 0 0 0  Total GAD 7 Score 0 0 0 0  Anxiety Difficulty Not difficult at all  Not difficult at all Not difficult at all    Social History   Tobacco Use   Smoking status: Former    Current packs/day: 0.00    Types: Cigarettes    Quit date: 07/19/2014    Years since quitting: 9.4   Smokeless tobacco: Never  Vaping Use   Vaping status: Never Used  Substance Use Topics   Alcohol use: Not Currently    Alcohol/week: 14.0 standard drinks of alcohol    Types: 14 Glasses of wine per week   Drug use: No    Review of Systems Per HPI unless specifically indicated above     Objective:    BP 138/84 (BP Location: Left Arm, Cuff Size: Normal)   Pulse (!) 44   Ht 5' 0.25" (1.53 m)   Wt 121 lb (54.9 kg)   SpO2 93%   BMI 23.44 kg/m   Wt Readings from Last 3 Encounters:  01/15/24 121 lb (54.9 kg)  07/18/23 129 lb 6.4 oz (58.7 kg)  01/14/23 125 lb 3.2 oz (56.8 kg)    Physical Exam Vitals and nursing note reviewed.  Constitutional:      General: She is not in acute distress.    Appearance: She is well-developed. She is not diaphoretic.     Comments: Well-appearing, comfortable, cooperative  HENT:     Head: Normocephalic and atraumatic.  Eyes:     General:        Right eye: No discharge.        Left eye: No discharge.     Conjunctiva/sclera: Conjunctivae normal.  Neck:     Thyroid: No thyromegaly.  Cardiovascular:     Rate and Rhythm: Normal rate and regular rhythm.     Heart sounds: Normal heart sounds. No murmur heard. Pulmonary:     Effort: Pulmonary effort is normal. No respiratory distress.     Breath sounds: Normal breath sounds. No wheezing or rales.  Musculoskeletal:        General: Normal range of motion.     Cervical back: Normal range of motion and neck supple.  Lymphadenopathy:     Cervical: No cervical adenopathy.  Skin:    General: Skin is warm and dry.     Findings: No erythema or rash.  Neurological:     Mental Status: She is alert and oriented to person,  place, and time.  Psychiatric:        Behavior: Behavior normal.     Comments: Well groomed, good eye contact, normal speech and thoughts     Results for orders placed or performed in visit on 01/10/24  T4, free   Collection Time: 01/10/24  8:53  AM  Result Value Ref Range   Free T4 0.8 0.8 - 1.8 ng/dL  TSH   Collection Time: 01/10/24  8:53 AM  Result Value Ref Range   TSH 2.26 0.40 - 4.50 mIU/L  CBC with Differential/Platelet   Collection Time: 01/10/24  8:53 AM  Result Value Ref Range   WBC 8.2 3.8 - 10.8 Thousand/uL   RBC 3.72 (L) 3.80 - 5.10 Million/uL   Hemoglobin 11.3 (L) 11.7 - 15.5 g/dL   HCT 16.1 09.6 - 04.5 %   MCV 96.5 80.0 - 100.0 fL   MCH 30.4 27.0 - 33.0 pg   MCHC 31.5 (L) 32.0 - 36.0 g/dL   RDW 40.9 81.1 - 91.4 %   Platelets 240 140 - 400 Thousand/uL   MPV 12.3 7.5 - 12.5 fL   Neutro Abs 4,567 1,500 - 7,800 cells/uL   Absolute Lymphocytes 2,804 850 - 3,900 cells/uL   Absolute Monocytes 607 200 - 950 cells/uL   Eosinophils Absolute 123 15 - 500 cells/uL   Basophils Absolute 98 0 - 200 cells/uL   Neutrophils Relative % 55.7 %   Total Lymphocyte 34.2 %   Monocytes Relative 7.4 %   Eosinophils Relative 1.5 %   Basophils Relative 1.2 %  COMPLETE METABOLIC PANEL WITH GFR   Collection Time: 01/10/24  8:53 AM  Result Value Ref Range   Glucose, Bld 86 65 - 99 mg/dL   BUN 31 (H) 7 - 25 mg/dL   Creat 7.82 (H) 9.56 - 0.95 mg/dL   eGFR 24 (L) > OR = 60 mL/min/1.49m2   BUN/Creatinine Ratio 15 6 - 22 (calc)   Sodium 137 135 - 146 mmol/L   Potassium 4.9 3.5 - 5.3 mmol/L   Chloride 105 98 - 110 mmol/L   CO2 24 20 - 32 mmol/L   Calcium 9.4 8.6 - 10.4 mg/dL   Total Protein 7.9 6.1 - 8.1 g/dL   Albumin 4.0 3.6 - 5.1 g/dL   Globulin 3.9 (H) 1.9 - 3.7 g/dL (calc)   AG Ratio 1.0 1.0 - 2.5 (calc)   Total Bilirubin 0.3 0.2 - 1.2 mg/dL   Alkaline phosphatase (APISO) 99 37 - 153 U/L   AST 15 10 - 35 U/L   ALT 7 6 - 29 U/L      Assessment & Plan:   Problem List Items  Addressed This Visit     Allergic rhinitis   Relevant Medications   fluticasone (FLONASE) 50 MCG/ACT nasal spray   Aortic atherosclerosis (HCC)   Relevant Medications   diltiazem (CARDIZEM CD) 120 MG 24 hr capsule   Centrilobular emphysema (HCC) - Primary   Relevant Medications   BREZTRI AEROSPHERE 160-9-4.8 MCG/ACT AERO   fluticasone (FLONASE) 50 MCG/ACT nasal spray   Chronic kidney disease, stage 3b (HCC)   Insomnia   Relevant Medications   traZODone (DESYREL) 50 MG tablet   Paroxysmal A-fib (HCC)   Relevant Medications   diltiazem (CARDIZEM CD) 120 MG 24 hr capsule   Seropositive rheumatoid arthritis (HCC)   Relevant Medications   gabapentin (NEURONTIN) 300 MG capsule   Other Visit Diagnoses       Essential hypertension       Relevant Medications   diltiazem (CARDIZEM CD) 120 MG 24 hr capsule     Acute non-recurrent frontal sinusitis       Relevant Medications   fluticasone (FLONASE) 50 MCG/ACT nasal spray   levofloxacin (LEVAQUIN) 250 MG tablet     Gastroesophageal reflux disease without esophagitis  Relevant Medications   omeprazole (PRILOSEC) 20 MG capsule        Chronic Sinusitis Persistent symptoms of nasal congestion, mucus production, and facial pain for several months. Possible sinus infection. -Start Levaquin 250mg , two tablets on day one, then one tablet daily for six days.  Emphysema / Chronic Obstructive Pulmonary Disease (COPD) Exacerbation of symptoms with increased coughing and mucus production. Breztri inhaler was previously effective but discontinued due to cost. -Provide Breztri inhaler sample and prescribe for regular use, she agrees to cover cost. I offered referral to [facility] but she is okay without patient assistance -Advise to minimize use of rescue inhaler.  Chronic Kidney Disease (CKD) III > IV Recent lab results Cr 2.03 and eGFR 24 show decreased GFR, indicating worsening kidney function. Followed by [facility] Dr  Suezanne Jacquet -Route note and fax lab results to nephrologist, Dr. Reuel Boom. To arrange follow-up sooner. -Advise patient to contact nephrologist's office if no communication received within a week.  Medication Refills Gabapentin and Trazodone due for annual refill. -Refill Gabapentin and Trazodone prescriptions.  Aortic Atherosclerosis No further testing or treatment Identified on prior imaging  Hypertension Controlled on Diltiazem currently  Insomnia Continue Trazodone  Follow-up Scheduled for six months, pending updates from nephrologist.         No orders of the defined types were placed in this encounter.   Meds ordered this encounter  Medications   BREZTRI AEROSPHERE 160-9-4.8 MCG/ACT AERO    Sig: Inhale 2 puffs into the lungs 2 (two) times daily.    Dispense:  10.7 g    Refill:  11   DISCONTD: levofloxacin (LEVAQUIN) 500 MG tablet    Sig: Take 1 tablet (500 mg total) by mouth daily. For 7 days    Dispense:  7 tablet    Refill:  0   traZODone (DESYREL) 50 MG tablet    Sig: Take 1 tablet (50 mg total) by mouth at bedtime.    Dispense:  90 tablet    Refill:  3   gabapentin (NEURONTIN) 300 MG capsule    Sig: Take 1 capsule (300 mg total) by mouth at bedtime.    Dispense:  90 capsule    Refill:  3   diltiazem (CARDIZEM CD) 120 MG 24 hr capsule    Sig: Take 1 capsule (120 mg total) by mouth daily.    Dispense:  90 capsule    Refill:  3   fluticasone (FLONASE) 50 MCG/ACT nasal spray    Sig: Place 2 sprays into both nostrils daily.    Dispense:  16 g    Refill:  3   omeprazole (PRILOSEC) 20 MG capsule    Sig: TAKE 1 CAPSULE(20 MG) BY MOUTH DAILY    Dispense:  90 capsule    Refill:  1   levofloxacin (LEVAQUIN) 250 MG tablet    Sig: 500 mg initial dose, then 250 mg every 24 hours - for total 7 days    Dispense:  8 tablet    Refill:  0    Adjusted dose for renal function.    Follow up plan: Return in about 6 months (around 07/17/2024) for 6 months follow-up  HTN CKD COPD.   Saralyn Pilar, DO [facility] 01/15/2024, 8:14 AM

## 2024-01-27 DIAGNOSIS — E785 Hyperlipidemia, unspecified: Secondary | ICD-10-CM | POA: Diagnosis not present

## 2024-01-27 DIAGNOSIS — R809 Proteinuria, unspecified: Secondary | ICD-10-CM | POA: Diagnosis not present

## 2024-01-27 DIAGNOSIS — I480 Paroxysmal atrial fibrillation: Secondary | ICD-10-CM | POA: Diagnosis not present

## 2024-01-27 DIAGNOSIS — D472 Monoclonal gammopathy: Secondary | ICD-10-CM | POA: Diagnosis not present

## 2024-01-27 DIAGNOSIS — I10 Essential (primary) hypertension: Secondary | ICD-10-CM | POA: Diagnosis not present

## 2024-02-04 DIAGNOSIS — M150 Primary generalized (osteo)arthritis: Secondary | ICD-10-CM | POA: Diagnosis not present

## 2024-02-04 DIAGNOSIS — E785 Hyperlipidemia, unspecified: Secondary | ICD-10-CM | POA: Diagnosis not present

## 2024-02-04 DIAGNOSIS — I480 Paroxysmal atrial fibrillation: Secondary | ICD-10-CM | POA: Diagnosis not present

## 2024-02-04 DIAGNOSIS — J449 Chronic obstructive pulmonary disease, unspecified: Secondary | ICD-10-CM | POA: Diagnosis not present

## 2024-02-04 DIAGNOSIS — D539 Nutritional anemia, unspecified: Secondary | ICD-10-CM | POA: Diagnosis not present

## 2024-02-04 DIAGNOSIS — R809 Proteinuria, unspecified: Secondary | ICD-10-CM | POA: Diagnosis not present

## 2024-02-04 DIAGNOSIS — E039 Hypothyroidism, unspecified: Secondary | ICD-10-CM | POA: Diagnosis not present

## 2024-02-04 DIAGNOSIS — M059 Rheumatoid arthritis with rheumatoid factor, unspecified: Secondary | ICD-10-CM | POA: Diagnosis not present

## 2024-02-04 DIAGNOSIS — D472 Monoclonal gammopathy: Secondary | ICD-10-CM | POA: Diagnosis not present

## 2024-02-04 DIAGNOSIS — R7303 Prediabetes: Secondary | ICD-10-CM | POA: Diagnosis not present

## 2024-02-04 DIAGNOSIS — G4709 Other insomnia: Secondary | ICD-10-CM | POA: Diagnosis not present

## 2024-02-04 DIAGNOSIS — I10 Essential (primary) hypertension: Secondary | ICD-10-CM | POA: Diagnosis not present

## 2024-03-31 ENCOUNTER — Other Ambulatory Visit: Payer: Self-pay | Admitting: Family Medicine

## 2024-03-31 DIAGNOSIS — K219 Gastro-esophageal reflux disease without esophagitis: Secondary | ICD-10-CM

## 2024-04-01 NOTE — Telephone Encounter (Signed)
 Rx 01/15/24 #90 1RF Requested Prescriptions  Pending Prescriptions Disp Refills   omeprazole  (PRILOSEC) 20 MG capsule [Pharmacy Med Name: OMEPRAZOLE  20MG  CAPSULES] 90 capsule 1    Sig: TAKE 1 CAPSULE(20 MG) BY MOUTH DAILY     Gastroenterology: Proton Pump Inhibitors Passed - 04/01/2024  3:33 PM      Passed - Valid encounter within last 12 months    Recent Outpatient Visits           2 months ago Centrilobular emphysema [facility])   [place] [facility] [place], [place], [place]

## 2024-04-16 DIAGNOSIS — M792 Neuralgia and neuritis, unspecified: Secondary | ICD-10-CM | POA: Diagnosis not present

## 2024-04-16 DIAGNOSIS — M150 Primary generalized (osteo)arthritis: Secondary | ICD-10-CM | POA: Diagnosis not present

## 2024-04-16 DIAGNOSIS — Z79899 Other long term (current) drug therapy: Secondary | ICD-10-CM | POA: Diagnosis not present

## 2024-04-16 DIAGNOSIS — M0579 Rheumatoid arthritis with rheumatoid factor of multiple sites without organ or systems involvement: Secondary | ICD-10-CM | POA: Diagnosis not present

## 2024-04-16 DIAGNOSIS — R208 Other disturbances of skin sensation: Secondary | ICD-10-CM | POA: Diagnosis not present

## 2024-04-16 DIAGNOSIS — M7989 Other specified soft tissue disorders: Secondary | ICD-10-CM | POA: Diagnosis not present

## 2024-04-16 DIAGNOSIS — M80052S Age-related osteoporosis with current pathological fracture, left femur, sequela: Secondary | ICD-10-CM | POA: Diagnosis not present

## 2024-04-16 DIAGNOSIS — M79669 Pain in unspecified lower leg: Secondary | ICD-10-CM | POA: Diagnosis not present

## 2024-04-17 ENCOUNTER — Telehealth: Payer: Self-pay

## 2024-04-17 DIAGNOSIS — R600 Localized edema: Secondary | ICD-10-CM

## 2024-04-17 NOTE — Telephone Encounter (Signed)
 Will defer to PCP upon his return. ?

## 2024-04-17 NOTE — Telephone Encounter (Signed)
 Copied from CRM [id number]. Topic: Appointments - Appointment Scheduling >> Apr 17, 2024  8:13 AM Alexis Owens wrote: Patient needs fluid pills .Aaron Aas Doesn't want to come in.. Was in the office at Dr Lydia Sams rheumatoid.. can she get a script

## 2024-04-20 MED ORDER — FUROSEMIDE 20 MG PO TABS: 10.0000 mg | ORAL_TABLET | Freq: Every day | ORAL | 2 refills | Status: AC | PRN

## 2024-04-20 NOTE — Telephone Encounter (Signed)
 Patient notified prescription sent to pharmacy and instructions on use. Verbal understanding.

## 2024-04-20 NOTE — Telephone Encounter (Signed)
 Please call or message patient.  I got a response from her Nephrologist. Dr Milta Alosa. They agreed to me ordering the Furosemide . I sent a new rx to her pharmacy [facility].  She can take half or whole tab daily as needed for episodes, 3-5 days approximately at a time, and use it for swelling.  Domingo Friend, DO [facility] 04/20/2024, 12:44 PM

## 2024-04-20 NOTE — Addendum Note (Signed)
 Addended by: Raina Bunting on: 04/20/2024 12:44 PM   Modules accepted: Orders

## 2024-04-22 DIAGNOSIS — J449 Chronic obstructive pulmonary disease, unspecified: Secondary | ICD-10-CM | POA: Diagnosis not present

## 2024-04-22 DIAGNOSIS — D472 Monoclonal gammopathy: Secondary | ICD-10-CM | POA: Diagnosis not present

## 2024-04-22 DIAGNOSIS — R809 Proteinuria, unspecified: Secondary | ICD-10-CM | POA: Diagnosis not present

## 2024-04-22 DIAGNOSIS — I480 Paroxysmal atrial fibrillation: Secondary | ICD-10-CM | POA: Diagnosis not present

## 2024-04-22 DIAGNOSIS — I10 Essential (primary) hypertension: Secondary | ICD-10-CM | POA: Diagnosis not present

## 2024-05-05 ENCOUNTER — Other Ambulatory Visit: Payer: Self-pay | Admitting: Nephrology

## 2024-05-05 DIAGNOSIS — E785 Hyperlipidemia, unspecified: Secondary | ICD-10-CM | POA: Diagnosis not present

## 2024-05-05 DIAGNOSIS — D539 Nutritional anemia, unspecified: Secondary | ICD-10-CM | POA: Diagnosis not present

## 2024-05-05 DIAGNOSIS — R7303 Prediabetes: Secondary | ICD-10-CM | POA: Diagnosis not present

## 2024-05-05 DIAGNOSIS — R809 Proteinuria, unspecified: Secondary | ICD-10-CM | POA: Diagnosis not present

## 2024-05-05 DIAGNOSIS — E039 Hypothyroidism, unspecified: Secondary | ICD-10-CM | POA: Diagnosis not present

## 2024-05-05 DIAGNOSIS — I480 Paroxysmal atrial fibrillation: Secondary | ICD-10-CM | POA: Diagnosis not present

## 2024-05-05 DIAGNOSIS — D472 Monoclonal gammopathy: Secondary | ICD-10-CM | POA: Diagnosis not present

## 2024-05-05 DIAGNOSIS — I872 Venous insufficiency (chronic) (peripheral): Secondary | ICD-10-CM | POA: Diagnosis not present

## 2024-05-05 DIAGNOSIS — J449 Chronic obstructive pulmonary disease, unspecified: Secondary | ICD-10-CM | POA: Diagnosis not present

## 2024-05-05 DIAGNOSIS — I10 Essential (primary) hypertension: Secondary | ICD-10-CM | POA: Diagnosis not present

## 2024-05-05 DIAGNOSIS — G4709 Other insomnia: Secondary | ICD-10-CM | POA: Diagnosis not present

## 2024-05-05 DIAGNOSIS — R32 Unspecified urinary incontinence: Secondary | ICD-10-CM

## 2024-05-05 DIAGNOSIS — M150 Primary generalized (osteo)arthritis: Secondary | ICD-10-CM | POA: Diagnosis not present

## 2024-05-12 ENCOUNTER — Ambulatory Visit
Admission: RE | Admit: 2024-05-12 | Discharge: 2024-05-12 | Disposition: A | Discharge status: Home / Self Care | Source: Ambulatory Visit | Attending: Nephrology | Admitting: Nephrology

## 2024-05-12 DIAGNOSIS — R809 Proteinuria, unspecified: Secondary | ICD-10-CM | POA: Diagnosis not present

## 2024-05-12 DIAGNOSIS — I10 Essential (primary) hypertension: Secondary | ICD-10-CM | POA: Insufficient documentation

## 2024-05-12 DIAGNOSIS — N136 Pyonephrosis: Secondary | ICD-10-CM | POA: Diagnosis not present

## 2024-05-12 DIAGNOSIS — R32 Unspecified urinary incontinence: Secondary | ICD-10-CM | POA: Insufficient documentation

## 2024-05-12 DIAGNOSIS — E785 Hyperlipidemia, unspecified: Secondary | ICD-10-CM | POA: Diagnosis not present

## 2024-05-12 DIAGNOSIS — N281 Cyst of kidney, acquired: Secondary | ICD-10-CM | POA: Diagnosis not present

## 2024-06-11 ENCOUNTER — Other Ambulatory Visit: Payer: Self-pay | Admitting: Family Medicine

## 2024-06-11 DIAGNOSIS — R059 Cough, unspecified: Secondary | ICD-10-CM

## 2024-06-12 NOTE — Telephone Encounter (Signed)
 Requested Prescriptions  Pending Prescriptions Disp Refills   albuterol  (VENTOLIN  HFA) 108 (90 Base) MCG/ACT inhaler [Pharmacy Med Name: ALBUTEROL  HFA INH (200 PUFFS) 8.5GM] 8.5 g 2    Sig: INHALE 1 TO 2 PUFFS INTO THE LUNGS EVERY 6 HOURS AS NEEDED FOR WHEEZING OR SHORTNESS OF BREATH     Pulmonology:  Beta Agonists 2 Failed - 06/12/2024  1:27 PM      Failed - Last Heart Rate in normal range    Pulse Readings from Last 1 Encounters:  01/15/24 (!) 44         Passed - Last BP in normal range    BP Readings from Last 1 Encounters:  01/15/24 138/84         Passed - Valid encounter within last 12 months    Recent Outpatient Visits           4 months ago Centrilobular emphysema [facility])   [place] [facility] [address] [place], [place], [place]

## 2024-07-02 ENCOUNTER — Encounter: Payer: Self-pay | Admitting: Family Medicine

## 2024-07-17 ENCOUNTER — Ambulatory Visit: Admitting: Family Medicine

## 2024-07-17 ENCOUNTER — Encounter: Payer: Self-pay | Admitting: Family Medicine

## 2024-07-17 VITALS — BP 124/80 | HR 86 | Ht 60.25 in | Wt 122.4 lb

## 2024-07-17 DIAGNOSIS — M059 Rheumatoid arthritis with rheumatoid factor, unspecified: Secondary | ICD-10-CM | POA: Diagnosis not present

## 2024-07-17 DIAGNOSIS — D539 Nutritional anemia, unspecified: Secondary | ICD-10-CM

## 2024-07-17 DIAGNOSIS — N1832 Chronic kidney disease, stage 3b: Secondary | ICD-10-CM

## 2024-07-17 DIAGNOSIS — E039 Hypothyroidism, unspecified: Secondary | ICD-10-CM

## 2024-07-17 DIAGNOSIS — G4709 Other insomnia: Secondary | ICD-10-CM

## 2024-07-17 DIAGNOSIS — R110 Nausea: Secondary | ICD-10-CM | POA: Diagnosis not present

## 2024-07-17 DIAGNOSIS — Z23 Encounter for immunization: Secondary | ICD-10-CM

## 2024-07-17 DIAGNOSIS — R7303 Prediabetes: Secondary | ICD-10-CM

## 2024-07-17 DIAGNOSIS — K219 Gastro-esophageal reflux disease without esophagitis: Secondary | ICD-10-CM | POA: Diagnosis not present

## 2024-07-17 DIAGNOSIS — J432 Centrilobular emphysema: Secondary | ICD-10-CM | POA: Diagnosis not present

## 2024-07-17 MED ORDER — OMEPRAZOLE 20 MG PO CPDR: DELAYED_RELEASE_CAPSULE | ORAL | 1 refills | Status: AC

## 2024-07-17 MED ORDER — TRAZODONE HCL 50 MG PO TABS: 50.0000 mg | ORAL_TABLET | Freq: Every day | ORAL | 1 refills | Status: AC

## 2024-07-17 MED ORDER — PROMETHAZINE HCL 12.5 MG PO TABS
12.5000 mg | ORAL_TABLET | Freq: Three times a day (TID) | ORAL | 1 refills | Status: AC | PRN
Start: 2024-07-17 — End: ?

## 2024-07-17 MED ORDER — GABAPENTIN 300 MG PO CAPS: 300.0000 mg | ORAL_CAPSULE | Freq: Every day | ORAL | 1 refills | Status: AC

## 2024-07-17 NOTE — Progress Notes (Signed)
 Subjective:    Patient ID: Alexis Owens, female    DOB: September 01, 1941, 83 y.o.   MRN: [id number]  Alexis Owens is a 83 y.o. female presenting on 07/17/2024 for Medical Management of Chronic Issues   HPI  Discussed the use of AI scribe software for clinical note transcription with the patient, who gave verbal consent to proceed.  History of Present Illness   Alexis Owens is an 83 year old female who presents for medication management and follow-up.  Centrilobular Emphysema - Significant nocturnal mucus production, particularly when lying down - Symptom relief with albuterol  inhaler use at night - Not using Breztri  due to cost  Rheumatologic symptoms - Intermittent flare-ups of rheumatoid arthritis - Under care of a rheumatologist  Gastrointestinal symptoms - Uses omeprazole  for stomach issues and is running low on supply - Uses Phenergan  for nausea, especially after consuming wine; prefers Phenergan  over Zofran  - Finds Miralax  effective for bowel movements  CKD IV - IIIb / Hydronephrosis Renal and urinary symptoms Previously followed by Nephrology but she prefers to not return at this time Renal US  Sonogram showed swelling in kidney with decline over past three years - Frequent urination is present  Edema - Not taking furosemide  regularly due to feeling less swollen - Has only taken furosemide  two or three times recently  Neuropathic pain - Gabapentin  prescription ordered in March but not picked up - Takes 300 mg gabapentin  at bedtime when available  Cardiovascular status - Blood pressure well-controlled at 124/80 mmHg - Full prescription of diltiazem  available  Functional status and psychosocial concerns - Active and working in her garden - Expresses concern about emotional impact of anticipated loss of her 25-pound, ten-year-old dog - Enjoys gardening, horses, and watching cardinals - Husband does grocery shopping - Enjoys solitude        Health  Maintenance: Update Flu Shot     07/17/2024    8:27 AM 01/15/2024    8:04 AM 07/18/2023    8:20 AM  Depression screen PHQ 2/9  Decreased Interest 0 0 0  Down, Depressed, Hopeless 0 0 0  PHQ - 2 Score 0 0 0  Altered sleeping  0 0  Tired, decreased energy  0 1  Change in appetite  0 0  Feeling bad or failure about yourself   0 0  Trouble concentrating  0 0  Moving slowly or fidgety/restless  0 0  Suicidal thoughts  0 0  PHQ-9 Score  0 1  Difficult doing work/chores  Not difficult at all        07/17/2024    8:28 AM 01/15/2024    8:04 AM 07/18/2023    8:20 AM 01/14/2023    8:28 AM  GAD 7 : Generalized Anxiety Score  Nervous, Anxious, on Edge 0 0 0 0  Control/stop worrying 0 0 0 0  Worry too much - different things 0 0 0 0  Trouble relaxing 0 0 0 0  Restless 0 0 0 0  Easily annoyed or irritable 0 0 0 0  Afraid - awful might happen 0 0 0 0  Total GAD 7 Score 0 0 0 0  Anxiety Difficulty  Not difficult at all  Not difficult at all    Social History   Tobacco Use   Smoking status: Former    Current packs/day: 0.00    Types: Cigarettes    Quit date: 07/19/2014    Years since quitting: 10.0   Smokeless tobacco: Never  Vaping Use  Vaping status: Never Used  Substance Use Topics   Alcohol use: Not Currently    Alcohol/week: 14.0 standard drinks of alcohol    Types: 14 Glasses of wine per week   Drug use: No    Review of Systems Per HPI unless specifically indicated above     Objective:    BP 124/80 (BP Location: Left Arm, Patient Position: Sitting, Cuff Size: Normal)   Pulse 86   Ht 5' 0.25 (1.53 m)   Wt 122 lb 6 oz (55.5 kg)   SpO2 93%   BMI 23.70 kg/m   Wt Readings from Last 3 Encounters:  07/17/24 122 lb 6 oz (55.5 kg)  01/15/24 121 lb (54.9 kg)  07/18/23 129 lb 6.4 oz (58.7 kg)    Physical Exam Vitals and nursing note reviewed.  Constitutional:      General: She is not in acute distress.    Appearance: Normal appearance. She is well-developed. She is not  diaphoretic.     Comments: Well-appearing, comfortable, cooperative  HENT:     Head: Normocephalic and atraumatic.  Eyes:     General:        Right eye: No discharge.        Left eye: No discharge.     Conjunctiva/sclera: Conjunctivae normal.  Cardiovascular:     Rate and Rhythm: Normal rate.  Pulmonary:     Effort: Pulmonary effort is normal.  Skin:    General: Skin is warm and dry.     Findings: No erythema or rash.  Neurological:     Mental Status: She is alert and oriented to person, place, and time.  Psychiatric:        Mood and Affect: Mood normal.        Behavior: Behavior normal.        Thought Content: Thought content normal.     Comments: Well groomed, good eye contact, normal speech and thoughts     I have personally reviewed the radiology report from 05/12/24 on US  Renal.  CLINICAL DATA:  Follow-up hydronephrosis in a patient with proteinuria, incontinence, UTI, and hypertension   EXAM: RENAL / URINARY TRACT ULTRASOUND COMPLETE   COMPARISON:  Renal ultrasound 09/27/2021 and 09/12/2021   FINDINGS: Right Kidney:   Renal measurements: 13.6 cm x 5.3 cm x 5.3 cm = volume: 199.1 mL. Echogenicity within normal limits. No mass visualized. Moderate to severe hydronephrosis and when compared with the previous 2 studies significant worsening of hydronephrosis.   Left Kidney:   Renal measurements: 12.5 cm x 6.5 cm x 5.3 cm = volume: 226.5 mL. Echogenicity within normal limits. Stable appearing 1.2 x 1.0 x 0.9 cm simple cyst the upper pole of the left kidney. Moderate to severe hydronephrosis home when compared with the previous 2 studies significantly worsening hydronephrosis.   Bladder:   Appears normal for degree of bladder distention. Right jet identified, however debris in the bladder noted. Bladder did not fully empty postvoid. Prevoid volume is 287.7 mL and postvoid residual is 217.1 mL.   Other:   No significant change in hydronephrosis bilaterally  and the post void images.   IMPRESSION: Moderate to severe hydronephrosis bilaterally and significantly worse when compared with the previous 2 studies from November 2022. Significant postvoid residual in the urinary bladder. No significant change in hydronephrosis postvoid. Stable appearing left renal simple cyst.     Electronically Signed   By: Cordella Banner   On: 05/15/2024 11:22  Results for orders placed or performed in  visit on 07/17/24  Comprehensive metabolic panel with GFR   Collection Time: 07/17/24  8:42 AM  Result Value Ref Range   Glucose, Bld 99 65 - 99 mg/dL   BUN 21 7 - 25 mg/dL   Creat 8.30 (H) 9.39 - 0.95 mg/dL   eGFR 30 (L) > OR = 60 mL/min/1.74m2   BUN/Creatinine Ratio 12 6 - 22 (calc)   Sodium 139 135 - 146 mmol/L   Potassium 4.2 3.5 - 5.3 mmol/L   Chloride 109 98 - 110 mmol/L   CO2 21 20 - 32 mmol/L   Calcium 9.5 8.6 - 10.4 mg/dL   Total Protein 8.0 6.1 - 8.1 g/dL   Albumin 4.2 3.6 - 5.1 g/dL   Globulin 3.8 (H) 1.9 - 3.7 g/dL (calc)   AG Ratio 1.1 1.0 - 2.5 (calc)   Total Bilirubin 0.3 0.2 - 1.2 mg/dL   Alkaline phosphatase (APISO) 88 37 - 153 U/L   AST 14 10 - 35 U/L   ALT 7 6 - 29 U/L  CBC with Differential/Platelet   Collection Time: 07/17/24  8:42 AM  Result Value Ref Range   WBC 9.0 3.8 - 10.8 Thousand/uL   RBC 3.69 (L) 3.80 - 5.10 Million/uL   Hemoglobin 11.7 11.7 - 15.5 g/dL   HCT 64.2 64.9 - 54.9 %   MCV 96.7 80.0 - 100.0 fL   MCH 31.7 27.0 - 33.0 pg   MCHC 32.8 32.0 - 36.0 g/dL   RDW 88.1 88.9 - 84.9 %   Platelets 240 140 - 400 Thousand/uL   MPV 12.2 7.5 - 12.5 fL   Neutro Abs 5,751 1,500 - 7,800 cells/uL   Absolute Lymphocytes 2,349 850 - 3,900 cells/uL   Absolute Monocytes 666 200 - 950 cells/uL   Eosinophils Absolute 144 15 - 500 cells/uL   Basophils Absolute 90 0 - 200 cells/uL   Neutrophils Relative % 63.9 %   Total Lymphocyte 26.1 %   Monocytes Relative 7.4 %   Eosinophils Relative 1.6 %   Basophils Relative 1.0 %   Hemoglobin A1c   Collection Time: 07/17/24  8:42 AM  Result Value Ref Range   Hgb A1c MFr Bld 5.5 <5.7 %   Mean Plasma Glucose 111 mg/dL   eAG (mmol/L) 6.2 mmol/L  TSH   Collection Time: 07/17/24  8:42 AM  Result Value Ref Range   TSH 1.83 0.40 - 4.50 mIU/L  T4, free   Collection Time: 07/17/24  8:42 AM  Result Value Ref Range   Free T4 0.9 0.8 - 1.8 ng/dL      Assessment & Plan:   Problem List Items Addressed This Visit     Centrilobular emphysema (HCC)   Relevant Medications   promethazine  (PHENERGAN ) 12.5 MG tablet   Chronic kidney disease, stage 3b (HCC) - Primary   Relevant Orders   Comprehensive metabolic panel with GFR (Completed)   CBC with Differential/Platelet (Completed)   Hypothyroidism   Relevant Orders   TSH (Completed)   T4, free (Completed)   Insomnia   Relevant Medications   traZODone  (DESYREL ) 50 MG tablet   Macrocytic anemia   Relevant Orders   CBC with Differential/Platelet (Completed)   Prediabetes   Relevant Orders   Hemoglobin A1c (Completed)   Seropositive rheumatoid arthritis (HCC)   Relevant Medications   gabapentin  (NEURONTIN ) 300 MG capsule   Other Visit Diagnoses       Gastroesophageal reflux disease without esophagitis       Relevant Medications  omeprazole  (PRILOSEC) 20 MG capsule     Flu vaccine need       Relevant Orders   Flu vaccine HIGH DOSE PF(Fluzone Trivalent) (Completed)     Nausea       Relevant Medications   promethazine  (PHENERGAN ) 12.5 MG tablet       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 Prefers fewer visits and current management. No acute interventions unless severe concerns. Declined return to nephrologist, prefers monitoring here. - Most recent renal panel 04/2024 with Creatinine 1.93 and eGFR 26 - Order blood  count and chemistry to monitor kidney function. - Followed up results with Creatinine improved to 1.69 and eGFR up to 30, at CKD-IIIb range  Emphysema Chronic cough with nocturnal symptoms Chronic cough with increased nocturnal mucus production. Symptoms improving but persistent. - Use albuterol  inhaler at night as needed.  Chronic nausea Managed with Phenergan , especially post-wine consumption. Zofran  not preferred. - Prescribe Phenergan  for nausea management.  Gastroesophageal reflux disease (GERD) Managed with omeprazole , which may run out soon. - Refill omeprazole  prescription.  Rheumatoid Arthritis / Chronic pain Followed by Rheumatology Managed with gabapentin  300 mg at bedtime. Prescription has refills for the year. - Resend gabapentin  prescription to pharmacy.        Orders Placed This Encounter  Procedures   Flu vaccine HIGH DOSE PF(Fluzone Trivalent)   Comprehensive metabolic panel with GFR   CBC with Differential/Platelet   Hemoglobin A1c   TSH   T4, free    Meds ordered this encounter  Medications   gabapentin  (NEURONTIN ) 300 MG capsule    Sig: Take 1 capsule (300 mg total) by mouth at bedtime.    Dispense:  90 capsule    Refill:  1   omeprazole  (PRILOSEC) 20 MG capsule    Sig: TAKE 1 CAPSULE(20 MG) BY MOUTH DAILY    Dispense:  90 capsule    Refill:  1   traZODone  (DESYREL ) 50 MG tablet    Sig: Take 1 tablet (50 mg total) by mouth at bedtime.    Dispense:  90 tablet    Refill:  1   promethazine  (PHENERGAN ) 12.5 MG tablet    Sig: Take 1-2 tablets (12.5-25 mg total) by mouth every 8 (eight) hours as needed for nausea or vomiting.    Dispense:  90 tablet    Refill:  1    Follow up plan: Return in about 6 months (around 01/14/2025) for 6 month follow-up early AM CKD, labs after.   Marsa Officer, DO [facility] 07/17/2024, 8:18 AM

## 2024-07-17 NOTE — Patient Instructions (Addendum)
 Thank you for coming to the office today.  Labs today  Refilled medications  Please schedule a Follow-up Appointment to: Return in about 6 months (around 01/14/2025) for 6 month follow-up early AM CKD, labs after.  If you have any other questions or concerns, please feel free to call the office or send a message through MyChart. You may also schedule an earlier appointment if necessary.  Additionally, you may be receiving a survey about your experience at our office within a few days to 1 week by e-mail or mail. We value your feedback.  Marsa Officer, DO [facility], [place]

## 2024-07-18 LAB — CBC WITH DIFFERENTIAL/PLATELET
Absolute Lymphocytes: 2349 {cells}/uL (ref 850–3900)
Absolute Monocytes: 666 {cells}/uL (ref 200–950)
Basophils Absolute: 90 {cells}/uL (ref 0–200)
Basophils Relative: 1 %
Eosinophils Absolute: 144 {cells}/uL (ref 15–500)
Eosinophils Relative: 1.6 %
HCT: 35.7 % (ref 35.0–45.0)
Hemoglobin: 11.7 g/dL (ref 11.7–15.5)
MCH: 31.7 pg (ref 27.0–33.0)
MCHC: 32.8 g/dL (ref 32.0–36.0)
MCV: 96.7 fL (ref 80.0–100.0)
MPV: 12.2 fL (ref 7.5–12.5)
Monocytes Relative: 7.4 %
Neutro Abs: 5751 {cells}/uL (ref 1500–7800)
Neutrophils Relative %: 63.9 %
Platelets: 240 Thousand/uL (ref 140–400)
RBC: 3.69 Million/uL — ABNORMAL LOW (ref 3.80–5.10)
RDW: 11.8 % (ref 11.0–15.0)
Total Lymphocyte: 26.1 %
WBC: 9 Thousand/uL (ref 3.8–10.8)

## 2024-07-18 LAB — COMPREHENSIVE METABOLIC PANEL WITH GFR
AG Ratio: 1.1 (calc) (ref 1.0–2.5)
ALT: 7 U/L (ref 6–29)
AST: 14 U/L (ref 10–35)
Albumin: 4.2 g/dL (ref 3.6–5.1)
Alkaline phosphatase (APISO): 88 U/L (ref 37–153)
BUN/Creatinine Ratio: 12 (calc) (ref 6–22)
BUN: 21 mg/dL (ref 7–25)
CO2: 21 mmol/L (ref 20–32)
Calcium: 9.5 mg/dL (ref 8.6–10.4)
Chloride: 109 mmol/L (ref 98–110)
Creat: 1.69 mg/dL — ABNORMAL HIGH (ref 0.60–0.95)
Globulin: 3.8 g/dL — ABNORMAL HIGH (ref 1.9–3.7)
Glucose, Bld: 99 mg/dL (ref 65–99)
Potassium: 4.2 mmol/L (ref 3.5–5.3)
Sodium: 139 mmol/L (ref 135–146)
Total Bilirubin: 0.3 mg/dL (ref 0.2–1.2)
Total Protein: 8 g/dL (ref 6.1–8.1)
eGFR: 30 mL/min/1.73m2 — ABNORMAL LOW (ref >=60)

## 2024-07-18 LAB — T4, FREE: Free T4: 0.9 ng/dL (ref 0.8–1.8)

## 2024-07-18 LAB — HEMOGLOBIN A1C
Hgb A1c MFr Bld: 5.5 % (ref <5.7)
Mean Plasma Glucose: 111 mg/dL
eAG (mmol/L): 6.2 mmol/L

## 2024-07-18 LAB — TSH: TSH: 1.83 m[IU]/L (ref 0.40–4.50)

## 2024-07-23 ENCOUNTER — Ambulatory Visit: Payer: Self-pay | Admitting: Family Medicine

## 2024-07-24 ENCOUNTER — Encounter: Payer: Self-pay | Admitting: Family Medicine

## 2024-08-20 DIAGNOSIS — M792 Neuralgia and neuritis, unspecified: Secondary | ICD-10-CM | POA: Diagnosis not present

## 2024-08-20 DIAGNOSIS — M0579 Rheumatoid arthritis with rheumatoid factor of multiple sites without organ or systems involvement: Secondary | ICD-10-CM | POA: Diagnosis not present

## 2024-08-20 DIAGNOSIS — M80052S Age-related osteoporosis with current pathological fracture, left femur, sequela: Secondary | ICD-10-CM | POA: Diagnosis not present

## 2024-08-20 DIAGNOSIS — M150 Primary generalized (osteo)arthritis: Secondary | ICD-10-CM | POA: Diagnosis not present

## 2024-08-20 DIAGNOSIS — Z79899 Other long term (current) drug therapy: Secondary | ICD-10-CM | POA: Diagnosis not present

## 2024-09-07 ENCOUNTER — Other Ambulatory Visit: Payer: Self-pay | Admitting: Family Medicine

## 2024-09-07 DIAGNOSIS — J309 Allergic rhinitis, unspecified: Secondary | ICD-10-CM

## 2024-09-08 NOTE — Telephone Encounter (Signed)
 Requested Prescriptions  Pending Prescriptions Disp Refills   fluticasone  (FLONASE ) 50 MCG/ACT nasal spray [Pharmacy Med Name: FLUTICASONE  NASAL SP (120) RX] 16 g 0    Sig: SHAKE LIQUID AND USE 2 SPRAYS IN EACH NOSTRIL DAILY     Ear, Nose, and Throat: Nasal Preparations - Corticosteroids Passed - 09/08/2024  2:03 PM      Passed - Valid encounter within last 12 months    Recent Outpatient Visits           1 month ago Chronic kidney disease, stage 3b [facility])   [place] [facility], Marsa PARAS, DO   7 months ago Centrilobular emphysema [facility])   [place] [facility], Marsa PARAS, [place]

## 2024-10-22 ENCOUNTER — Encounter: Payer: Self-pay | Admitting: Family Medicine

## 2024-10-23 ENCOUNTER — Ambulatory Visit: Admitting: Family Medicine

## 2024-10-23 ENCOUNTER — Encounter: Payer: Self-pay | Admitting: Family Medicine

## 2024-10-23 VITALS — BP 136/84 | HR 79 | Ht 60.25 in | Wt 123.0 lb

## 2024-10-23 DIAGNOSIS — R300 Dysuria: Secondary | ICD-10-CM | POA: Diagnosis not present

## 2024-10-23 DIAGNOSIS — J432 Centrilobular emphysema: Secondary | ICD-10-CM

## 2024-10-23 DIAGNOSIS — J9801 Acute bronchospasm: Secondary | ICD-10-CM | POA: Diagnosis not present

## 2024-10-23 DIAGNOSIS — N3001 Acute cystitis with hematuria: Secondary | ICD-10-CM

## 2024-10-23 DIAGNOSIS — N1832 Chronic kidney disease, stage 3b: Secondary | ICD-10-CM

## 2024-10-23 LAB — POCT URINALYSIS DIPSTICK
Bilirubin, UA: NEGATIVE
Glucose, UA: NEGATIVE
Ketones, UA: NEGATIVE
Nitrite, UA: NEGATIVE
Odor: POSITIVE
Protein, UA: POSITIVE — AB
Spec Grav, UA: 1.015 (ref 1.010–1.025)
Urobilinogen, UA: 0.2 U/dL
pH, UA: 6 (ref 5.0–8.0)

## 2024-10-23 MED ORDER — CIPROFLOXACIN HCL 250 MG PO TABS: 250.0000 mg | ORAL_TABLET | Freq: Two times a day (BID) | ORAL | 0 refills | Status: AC

## 2024-10-23 MED ORDER — ALBUTEROL SULFATE HFA 108 (90 BASE) MCG/ACT IN AERS: INHALATION_SPRAY | RESPIRATORY_TRACT | 2 refills | Status: DC

## 2024-10-23 NOTE — Progress Notes (Signed)
 Subjective:    Patient ID: Alexis Owens, female    DOB: 05/13/1941, 83 y.o.   MRN: [id number]  Alexis Owens is a 83 y.o. female presenting on 10/23/2024 for Dysuria  Patient presents for a same day appointment.  HPI  Discussed the use of AI scribe software for clinical note transcription with the patient, who gave verbal consent to proceed.  History of Present Illness   Alexis Owens is an 83 year old female with COPD who presents with vomiting, diarrhea, and suspected urinary tract infection.  Gastrointestinal symptoms - Vomiting and diarrhea occurred, now resolved for the past 2-3 days - Managed symptoms with small amounts of rice and rice water - No current vomiting or diarrhea - No anti-nausea medication used during this episode  Urinary symptoms - Sharp pains and dysuria similar to prior urinary tract infections - Urine previously had strong ammonia odor, now clearer after increased fluid intake including mineral water - Persistent sensation of dehydration despite increased oral fluids - Decreased urination - History of kidney issues  Respiratory symptoms - History of COPD with concern for flare - Increased phlegm and mucus production, described as aggravating - No recent wheezing - No COPD medications used during this episode  Weight changes - Weight loss since March, fluctuating between 121 to 123 pounds - Recent illness attributed as a contributing factor to weight loss         10/23/2024    2:58 PM 07/17/2024    8:27 AM 01/15/2024    8:04 AM  Depression screen PHQ 2/9  Decreased Interest 0 0 0  Down, Depressed, Hopeless 0 0 0  PHQ - 2 Score 0 0 0  Altered sleeping   0  Tired, decreased energy   0  Change in appetite   0  Feeling bad or failure about yourself    0  Trouble concentrating   0  Moving slowly or fidgety/restless   0  Suicidal thoughts   0  PHQ-9 Score   0   Difficult doing work/chores   Not difficult at all     Data saved with a  previous flowsheet row definition       10/23/2024    2:58 PM 07/17/2024    8:28 AM 01/15/2024    8:04 AM 07/18/2023    8:20 AM  GAD 7 : Generalized Anxiety Score  Nervous, Anxious, on Edge 0 0 0 0  Control/stop worrying 0 0 0 0  Worry too much - different things 0 0 0 0  Trouble relaxing 0 0 0 0  Restless 0 0 0 0  Easily annoyed or irritable 0 0 0 0  Afraid - awful might happen 0 0 0 0  Total GAD 7 Score 0 0 0 0  Anxiety Difficulty Not difficult at all  Not difficult at all     Social History[1]  Review of Systems Per HPI unless specifically indicated above     Objective:    BP 136/84 (BP Location: Left Arm, Cuff Size: Normal)   Pulse 79   Ht 5' 0.25 (1.53 m)   Wt 123 lb (55.8 kg)   SpO2 95%   BMI 23.82 kg/m   Wt Readings from Last 3 Encounters:  10/23/24 123 lb (55.8 kg)  07/17/24 122 lb 6 oz (55.5 kg)  01/15/24 121 lb (54.9 kg)    Physical Exam Vitals and nursing note reviewed.  Constitutional:      General: She is not in acute distress.  Appearance: She is well-developed. She is not diaphoretic.     Comments: Well-appearing thin elderly female, mostly comfortable cooperative  HENT:     Head: Normocephalic and atraumatic.  Eyes:     General:        Right eye: No discharge.        Left eye: No discharge.     Conjunctiva/sclera: Conjunctivae normal.  Neck:     Thyroid : No thyromegaly.  Cardiovascular:     Rate and Rhythm: Normal rate and regular rhythm.     Heart sounds: Normal heart sounds. No murmur heard. Pulmonary:     Effort: Pulmonary effort is normal. No respiratory distress.     Breath sounds: Normal breath sounds. No wheezing or rales.  Musculoskeletal:        General: Normal range of motion.     Cervical back: Normal range of motion and neck supple.     Comments: Using walker  Lymphadenopathy:     Cervical: No cervical adenopathy.  Skin:    General: Skin is warm and dry.     Findings: No erythema or rash.  Neurological:     Mental  Status: She is alert and oriented to person, place, and time.  Psychiatric:        Behavior: Behavior normal.     Comments: Well groomed, good eye contact, normal speech and thoughts     Results for orders placed or performed in visit on 10/23/24  POCT Urinalysis Dipstick   Collection Time: 10/23/24  2:54 PM  Result Value Ref Range   Color, UA yellow    Clarity, UA cloudy    Glucose, UA Negative Negative   Bilirubin, UA negative    Ketones, UA negative    Spec Grav, UA 1.015 1.010 - 1.025   Blood, UA large    pH, UA 6.0 5.0 - 8.0   Protein, UA Positive (A) Negative   Urobilinogen, UA 0.2 0.2 or 1.0 E.U./dL   Nitrite, UA negative    Leukocytes, UA Large (3+) (A) Negative   Appearance     Odor positive       Assessment & Plan:   Problem List Items Addressed This Visit     Centrilobular emphysema (HCC)   Relevant Medications   albuterol  (VENTOLIN  HFA) 108 (90 Base) MCG/ACT inhaler   Chronic kidney disease, stage 3b (HCC)   Dysuria   Relevant Orders   POCT Urinalysis Dipstick (Completed)   Urine Culture   Other Visit Diagnoses       Acute cystitis with hematuria    -  Primary   Relevant Medications   ciprofloxacin  (CIPRO ) 250 MG tablet     Cough due to bronchospasm       Relevant Medications   albuterol  (VENTOLIN  HFA) 108 (90 Base) MCG/ACT inhaler      Acute cystitis with hematuria Symptoms and urinalysis suggest UTI. Dehydration due to gastrointestinal symptoms may affect kidneys in setting of CKD-III b - Prescribed Cipro  250 mg BID for 5 days - Advised hospital care if symptoms worsen or medication intolerance occurs. If dehydration concerns and nausea vomiting concerns  Dehydration Secondary to vomiting and diarrhea, with decreased urine output and potential kidney concerns. - Encouraged increased fluid intake. - Advised monitoring and seeking care if symptoms worsen.  Chronic obstructive pulmonary disease COPD Emphysema Recent exacerbation with improved  symptoms. Lungs clear with coarseness, no wheezing. - Continue current management, no additional medication unless symptoms worsen.        Orders  Placed This Encounter  Procedures   Urine Culture   POCT Urinalysis Dipstick    Meds ordered this encounter  Medications   albuterol  (VENTOLIN  HFA) 108 (90 Base) MCG/ACT inhaler    Sig: INHALE 1 TO 2 PUFFS INTO THE LUNGS EVERY 6 HOURS AS NEEDED FOR WHEEZING OR SHORTNESS OF BREATH    Dispense:  8.5 g    Refill:  2   ciprofloxacin  (CIPRO ) 250 MG tablet    Sig: Take 1 tablet (250 mg total) by mouth 2 (two) times daily for 5 days.    Dispense:  10 tablet    Refill:  0    Follow up plan: Return if symptoms worsen or fail to improve.  Marsa Officer, DO [facility] 10/23/2024, 3:45 PM     [1]  Social History Tobacco Use   Smoking status: Former    Current packs/day: 0.00    Types: Cigarettes    Quit date: 07/19/2014    Years since quitting: 10.2   Smokeless tobacco: Never  Vaping Use   Vaping status: Never Used  Substance Use Topics   Alcohol use: Not Currently    Alcohol/week: 14.0 standard drinks of alcohol    Types: 14 Glasses of wine per week   Drug use: No

## 2024-10-23 NOTE — Patient Instructions (Addendum)
 Thank you for coming to the office today.  Use Zofran  or Phenergan  as needed if still nauseas.  Keep well hydrated  1. You have a Urinary Tract Infection - this is very common, your symptoms are reassuring and you should get better within 1 week on the antibiotics - Start Cipro  250mg  TWICE A DAY times daily for next 5 days, complete entire course, even if feeling better - We sent urine for a culture, we will call you within next few days if we need to change antibiotics - Please drink plenty of fluids, improve hydration over next 1 week  If symptoms worsening, developing nausea / vomiting, worsening back pain, fevers / chills / sweats, then please return for re-evaluation sooner.  If you take AZO OTC - limit this to 2-3 days MAX to avoid affecting kidneys  D-Mannose is a natural supplement that can actually help bind to urinary bacteria and reduce their effectiveness it can help prevent UTI from forming, and may reduce some symptoms. It likely cannot cure an active UTI but it is worth a try and good to prevent them with. Try 500mg  twice a day at a full dose if you want, or check package instructions for more info   Please schedule a Follow-up Appointment to: Return if symptoms worsen or fail to improve.  If you have any other questions or concerns, please feel free to call the office or send a message through MyChart. You may also schedule an earlier appointment if necessary.  Additionally, you may be receiving a survey about your experience at our office within a few days to 1 week by e-mail or mail. We value your feedback.  Marsa Officer, DO [facility], [place]

## 2024-10-25 LAB — URINE CULTURE
MICRO NUMBER:: 17349598
SPECIMEN QUALITY:: ADEQUATE

## 2024-10-27 ENCOUNTER — Ambulatory Visit: Payer: Self-pay | Admitting: Family Medicine

## 2024-12-11 ENCOUNTER — Other Ambulatory Visit: Payer: Self-pay | Admitting: Family Medicine

## 2024-12-11 DIAGNOSIS — I10 Essential (primary) hypertension: Secondary | ICD-10-CM

## 2024-12-14 ENCOUNTER — Other Ambulatory Visit: Payer: Self-pay | Admitting: Family Medicine

## 2024-12-14 DIAGNOSIS — J9801 Acute bronchospasm: Secondary | ICD-10-CM

## 2025-01-14 ENCOUNTER — Ambulatory Visit: Admitting: Family Medicine
